# Patient Record
Sex: Male | Born: 1935
Health system: Southern US, Community
[De-identification: ages and names within clinical notes are randomized; demographics above are authoritative.]

## PROBLEM LIST (undated history)

## (undated) DIAGNOSIS — I10 Essential (primary) hypertension: Secondary | ICD-10-CM

## (undated) DIAGNOSIS — J449 Chronic obstructive pulmonary disease, unspecified: Secondary | ICD-10-CM

## (undated) DIAGNOSIS — C169 Malignant neoplasm of stomach, unspecified: Secondary | ICD-10-CM

## (undated) DIAGNOSIS — D649 Anemia, unspecified: Secondary | ICD-10-CM

## (undated) HISTORY — PX: CARDIAC VALVE REPLACEMENT: SHX585

## (undated) HISTORY — PX: JOINT REPLACEMENT: SHX530

---

## 2000-09-10 ENCOUNTER — Ambulatory Visit (HOSPITAL_COMMUNITY): Admission: RE | Admit: 2000-09-10 | Discharge: 2000-09-10 | Payer: Self-pay | Admitting: Gastroenterology

## 2011-01-06 ENCOUNTER — Encounter: Payer: Self-pay | Admitting: Thoracic Surgery (Cardiothoracic Vascular Surgery)

## 2011-01-11 ENCOUNTER — Encounter: Payer: Self-pay | Admitting: Thoracic Surgery (Cardiothoracic Vascular Surgery)

## 2011-01-11 ENCOUNTER — Institutional Professional Consult (permissible substitution) (INDEPENDENT_AMBULATORY_CARE_PROVIDER_SITE_OTHER): Payer: Self-pay | Admitting: Thoracic Surgery (Cardiothoracic Vascular Surgery)

## 2011-01-11 VITALS — BP 110/80 | HR 80 | Temp 99.0°F | Resp 12 | Ht 67.0 in | Wt 182.0 lb

## 2011-01-11 DIAGNOSIS — I350 Nonrheumatic aortic (valve) stenosis: Secondary | ICD-10-CM

## 2011-01-11 DIAGNOSIS — J449 Chronic obstructive pulmonary disease, unspecified: Secondary | ICD-10-CM

## 2011-01-11 DIAGNOSIS — I359 Nonrheumatic aortic valve disorder, unspecified: Secondary | ICD-10-CM

## 2011-01-11 NOTE — Progress Notes (Signed)
PCP is No primary provider on file. Referring Provider is Georgeanna Lea., MD  Chief Complaint  Patient presents with  . Shortness of Breath    HPI  The patient is a 77 year old white male with a past history of severe COPD. The patient smokes 4-5 packs cigarettes per day stopping approximately 20 years prior to this visit. The patient was diagnosed over the past several years as having aortic stenosis. He has been followed by his cardiologist and progression of the aortic stenosis has been documented. A recent echocardiogram done in 12/14/2010 showed an ejection fraction of 50% with moderate aortic insufficiency and severe aortic stenosis. The velocity across the aortic valve was 451 cm/s. The peak gradient was 81 mm mercury and a mean gradient was 53 mm mercury. The aortic valve area of 0.7 cm. Moderate mitral regurgitation was noted. Pulmonary artery pressure was normal. Cardiac catheterization was carried out on 12/26/2010. Aortic valve could not be crossed. There is nonobstructive coronary artery disease with some ectasia noted. The patient was evaluated in our office today for possible aortic valve replacement.  No past medical history on file.  No past surgical history on file.  No family history on file.  Social History History  Substance Use Topics  . Smoking status: Former Smoker -- 5.0 packs/day    Types: Cigarettes    Quit date: 01/11/1992  . Smokeless tobacco: Not on file  . Alcohol Use: No    Current Outpatient Prescriptions  Medication Sig Dispense Refill  . amLODipine-benazepril (LOTREL) 10-40 MG per capsule Take 1 capsule by mouth.        . Aspirin 81 MG EC tablet Take 81 mg by mouth daily.        . carvedilol (COREG) 25 MG tablet Take 25 mg by mouth.        . hydrochlorothiazide 25 MG tablet Take 25 mg by mouth daily.        . Omega-3 Fatty Acids (FISH OIL) 1000 MG CAPS Take 1,000 mg by mouth daily.        Marland Kitchen PRAVASTATIN SODIUM PO Take 10 mg by mouth.          . prednisoLONE acetate (PRED FORTE) 1 % ophthalmic suspension Instill 1 drop into right eye 2 times daily       . prednisoLONE acetate (PRED FORTE) 1 % ophthalmic suspension Instill 1 drop into affected eye(s) 4 times daily       . SLO-NIACIN PO Take by mouth.        . vitamin C (ASCORBIC ACID) 500 MG tablet Take 500 mg by mouth daily.        . vitamin E 400 UNIT capsule Take 400 Units by mouth daily.          Allergies no known allergies   Review of Systems  BP 110/80  Pulse 80  Temp 99 F (37.2 C)  Resp 12  Ht 5\' 7"  (1.702 m)  Wt 182 lb (82.555 kg)  BMI 28.51 kg/m2 Physical Exam   Diagnostic Tests   Impression The patient has severe aortic stenosis. Aortic valve replacement as appropriate. Aortic valve replacement was discussed to the patient and his wife. Risks including death complications including bleeding stroke infection benefits and alternatives reviewed. The porcine valve was decided upon.  Plan Aortic valve replacement will be scheduled.

## 2011-01-31 DIAGNOSIS — I359 Nonrheumatic aortic valve disorder, unspecified: Secondary | ICD-10-CM

## 2011-02-08 ENCOUNTER — Telehealth: Payer: Self-pay | Admitting: *Deleted

## 2011-02-08 NOTE — Telephone Encounter (Signed)
The patients wife called with concerns of lack of mobility and shortness of breath. She states he also has some swelling in his feet and has gained 3 lbs. He is ambulating the same distance as in the hospital, but he feels he should be improving faster than he is. His incisions are healing well and his BP, HR and temp are wnl. His last CXR before discharge 2 days ago was clear. After discussion with Dr Arvilla Market, he was prescribed Lasix 40mg  po daily for 5 days as well as KDur po daily for 5 days. [facility] was contacted for routine post [facility] with a nurse as well as physical therapy. Follow up phone call will be performed on Friday, Oct 5.

## 2011-02-09 ENCOUNTER — Ambulatory Visit (INDEPENDENT_AMBULATORY_CARE_PROVIDER_SITE_OTHER): Payer: Medicare HMO | Admitting: Physician Assistant

## 2011-02-09 DIAGNOSIS — R600 Localized edema: Secondary | ICD-10-CM

## 2011-02-09 DIAGNOSIS — R0602 Shortness of breath: Secondary | ICD-10-CM

## 2011-02-09 DIAGNOSIS — I359 Nonrheumatic aortic valve disorder, unspecified: Secondary | ICD-10-CM

## 2011-02-09 DIAGNOSIS — J449 Chronic obstructive pulmonary disease, unspecified: Secondary | ICD-10-CM

## 2011-02-10 NOTE — Progress Notes (Signed)
Mr. Fouts home health nurse phone today reporting that he continues to have significant shortness of breath with exertion and he has also had a weight gain of 3 or 4 pounds over the past 2 days despite taking Lasix 40 mg by mouth daily. I phone Mr. Kropp home and spoke with his wife who confirmed that indeed had not had any progress with his respiratory status over the past few days. I asked him to come to the office to be evaluated.  Mr. Knobel arrived with his wife. Mr. Cayson is using wheelchair today. He is alert and oriented and does not appear to be in acute distress while at rest. He denies any chest pain other than the expected sternal soreness. He complains of getting short of breath with minimal ambulation around the house.  On exam, his heart rate is in the mid 70s and regular his respiratory rate is 18-20. He is afebrile. He has no JVD. His sternal incision is intact and healing satisfactorily. Sternum stable. His heart is in a regular rate and rhythm with expected soft systolic murmur consistent with recent prosthetic valve. His breath sounds are diminished in the left base and are otherwise clear to auscultation.  Mr. Wacker continues to have 3+ pitting edema in both lower extremities from the knee down into the foot.  Chest x-ray obtained today shows a slightly larger left pleural effusion although still remains fairly small. His lung fields are otherwise clear with normal pulmonary vascularity. Cardiac silhouette is moderately enlarged. Lab data obtained today include a BMP with electrolytes entirely within normal limits: Sodium 136 potassium 3.5 chloride 95 carbon dioxide 34 glucose 142 he went 16 creatinine 0.75 CBC shows white blood count 10,900 hemoglobin 11.4 g hematocrit 34.7%  Impression:  Mr. Glasscock has significant chronic obstructive pulmonary disease based on his preoperative pulmonary function testing which revealed an FEV1 in the 40% range. I think his shortness  of breath at this point is compounded by volume overload and this small but significant left pleural effusion. I believe his symptomswill improved with more aggressive diuresis. I prescribed Demadex 20 mg by mouth daily for the next 5 days along with potassium 20 mEq by mouth twice a day. He was also using a Ventolin inhaler prior to surgery which he has exhausted so I have given a new prescription for a Ventolin MDI. He is asked to decrease his amiodarone to 200mg  po bid.  He is to followup in the office on Monday, October 10. I have asked him to keep careful record of his daily weight. We will also recheck his electrolytes on Monday.

## 2011-02-13 ENCOUNTER — Ambulatory Visit (INDEPENDENT_AMBULATORY_CARE_PROVIDER_SITE_OTHER): Payer: Medicare HMO | Admitting: Physician Assistant

## 2011-02-13 DIAGNOSIS — R609 Edema, unspecified: Secondary | ICD-10-CM

## 2011-02-13 NOTE — Progress Notes (Signed)
[name] return to the office today for followup after being seen for shortness of breath last week here in the office. At that time he was found to have significant lower trimmings edema and also had a small left pleural effusion. He was prescribed Demadex 20 mg by mouth twice a day for 3 days then 20 mg by mouth daily along with potassium 20 mEq by mouth twice daily. He is also given a Ventolin inhaler which is used only twice in the past 4 days.  [name] says he is feeling much better today having less shortness of breath and more endurance with ambulation. He has lost in excess of 5 pounds since his previous visit. He denies any chest pain except for minimal parasternal soreness. On physical exam: Vital signs include heart rate 68 blood pressure 102/50 respirations 20. He is afebrile. The sternal incision is healing with no sign of complication. The mediastinal chest tube suture is removed. His breath sounds are clear to auscultation and are full in all fields. His heart is in a regular rate and rhythm with a very soft systolic murmur consistent with his prosthetic aortic valve. The edema in his lower extremities has decreased dramatically since his previous visit. He continues to have 2+ edema extending up to just above the ankle bilaterally but the remainder of his legs up to the level of the knee is free of edema.  Impression: [name] is making excellent progress with diuresis and resulting improved respiratory status. I asked him to continue the Demadex at 20 mg once daily until he sees his cardiologist on October 17. At that point I expect he will be able to resume his hydrochlorothiazide and stop the Demadex. His other medications will remain unchanged pending the visit with his cardiologist. I asked [name] to stop by the lab after he leaves the office to have a BMP today so that we may check his electrolytes since he has had a brisk diuresis for the past few days. He scheduled to  followup in our office on November 7 and understands he may call us at any time should any other problem arise in the interim.

## 2011-03-15 ENCOUNTER — Ambulatory Visit (INDEPENDENT_AMBULATORY_CARE_PROVIDER_SITE_OTHER): Payer: Medicare HMO | Admitting: Physician Assistant

## 2011-03-15 DIAGNOSIS — Z952 Presence of prosthetic heart valve: Secondary | ICD-10-CM

## 2011-03-15 DIAGNOSIS — I359 Nonrheumatic aortic valve disorder, unspecified: Secondary | ICD-10-CM

## 2011-03-15 DIAGNOSIS — J90 Pleural effusion, not elsewhere classified: Secondary | ICD-10-CM

## 2011-03-15 NOTE — Progress Notes (Signed)
[name] return to the office today for scheduled followup after aortic valve replacement for aortic stenosis and congestive heart failure. The surgery was on 01/31/2011. His postoperative course was uneventful. Since his discharge home he was found to have a left pleural effusion that was drained by a physician in [place]. Following that procedure he had significant pain radiating from his back along the costal margin around anteriorly to the midline. This lasted to 3 days and has now resolved. He denies any shortness of breath or chest pain he still complains of easy fatigability and also complains insomnia.  Physical exam her heart is in a regular rate and rhythm. There is a soft systolic murmur consistent with the pericardial tissue valve. The sternotomy incision is well healed. The sternum stable. Breath sounds are clear to auscultation. Chest x-ray obtained today demonstrates a small left pleural effusion which is stable. Lung fields are otherwise clear and the cardiomediastinal silhouette is moderately enlarged but stable.  Medications are reviewed and include amiodarone 200 mg by mouth daily aspirin 81 mg by mouth daily Coreg 25 mg by mouth twice a day chlorothiazide 25 mg daily lisinopril 40 mg daily Lotrel 10/40 one by mouth daily vitamin E, vitamin C, and a niacin supplement.  [name] has scheduled followup with Dr. Bing Matter  [facility] the [facility] next month. No further followup scheduled with her office but he understands he may contact us at any time if we can be of any assistance.

## 2012-02-15 ENCOUNTER — Encounter (HOSPITAL_COMMUNITY): Payer: Self-pay

## 2012-02-15 ENCOUNTER — Inpatient Hospital Stay (HOSPITAL_COMMUNITY)
Admission: AD | Admit: 2012-02-15 | Discharge: 2012-03-07 | DRG: 327 | Disposition: A | Discharge status: Home / Self Care | Payer: Medicare HMO | Source: Other Acute Inpatient Hospital | Attending: Cardiothoracic Surgery | Admitting: Cardiothoracic Surgery

## 2012-02-15 DIAGNOSIS — M129 Arthropathy, unspecified: Secondary | ICD-10-CM | POA: Diagnosis present

## 2012-02-15 DIAGNOSIS — K2289 Other specified disease of esophagus: Secondary | ICD-10-CM

## 2012-02-15 DIAGNOSIS — N400 Enlarged prostate without lower urinary tract symptoms: Secondary | ICD-10-CM | POA: Diagnosis present

## 2012-02-15 DIAGNOSIS — D371 Neoplasm of uncertain behavior of stomach: Secondary | ICD-10-CM

## 2012-02-15 DIAGNOSIS — Z952 Presence of prosthetic heart valve: Secondary | ICD-10-CM

## 2012-02-15 DIAGNOSIS — D375 Neoplasm of uncertain behavior of rectum: Secondary | ICD-10-CM

## 2012-02-15 DIAGNOSIS — Z87891 Personal history of nicotine dependence: Secondary | ICD-10-CM

## 2012-02-15 DIAGNOSIS — I10 Essential (primary) hypertension: Secondary | ICD-10-CM | POA: Diagnosis present

## 2012-02-15 DIAGNOSIS — Z96659 Presence of unspecified artificial knee joint: Secondary | ICD-10-CM

## 2012-02-15 DIAGNOSIS — K560 Paralytic ileus: Secondary | ICD-10-CM | POA: Diagnosis not present

## 2012-02-15 DIAGNOSIS — D378 Neoplasm of uncertain behavior of other specified digestive organs: Secondary | ICD-10-CM

## 2012-02-15 DIAGNOSIS — C169 Malignant neoplasm of stomach, unspecified: Secondary | ICD-10-CM | POA: Diagnosis present

## 2012-02-15 DIAGNOSIS — Z954 Presence of other heart-valve replacement: Secondary | ICD-10-CM

## 2012-02-15 DIAGNOSIS — K922 Gastrointestinal hemorrhage, unspecified: Secondary | ICD-10-CM | POA: Diagnosis present

## 2012-02-15 DIAGNOSIS — E785 Hyperlipidemia, unspecified: Secondary | ICD-10-CM | POA: Diagnosis present

## 2012-02-15 DIAGNOSIS — C160 Malignant neoplasm of cardia: Principal | ICD-10-CM | POA: Diagnosis present

## 2012-02-15 DIAGNOSIS — K228 Other specified diseases of esophagus: Secondary | ICD-10-CM

## 2012-02-15 DIAGNOSIS — H5440 Blindness, one eye, unspecified eye: Secondary | ICD-10-CM | POA: Diagnosis present

## 2012-02-15 DIAGNOSIS — D62 Acute posthemorrhagic anemia: Secondary | ICD-10-CM | POA: Diagnosis present

## 2012-02-15 HISTORY — DX: Anemia, unspecified: D64.9

## 2012-02-15 HISTORY — DX: Essential (primary) hypertension: I10

## 2012-02-15 HISTORY — DX: Malignant neoplasm of stomach, unspecified: C16.9

## 2012-02-15 HISTORY — DX: Chronic obstructive pulmonary disease, unspecified: J44.9

## 2012-02-15 LAB — CBC
HCT: 28.9 % — ABNORMAL LOW (ref 39.0–52.0)
Hemoglobin: 10.2 g/dL — ABNORMAL LOW (ref 13.0–17.0)
MCH: 30.9 pg (ref 26.0–34.0)
MCHC: 35.3 g/dL (ref 30.0–36.0)
MCV: 87.6 fL (ref 78.0–100.0)
Platelets: DECREASED 10*3/uL (ref 150–400)
RBC: 3.3 MIL/uL — ABNORMAL LOW (ref 4.22–5.81)
RDW: 15.3 % (ref 11.5–15.5)
WBC: 8 10*3/uL (ref 4.0–10.5)

## 2012-02-15 LAB — COMPREHENSIVE METABOLIC PANEL
ALT: 10 U/L (ref 0–53)
AST: 13 U/L (ref 0–37)
Albumin: 2.4 g/dL — ABNORMAL LOW (ref 3.5–5.2)
CO2: 24 mEq/L (ref 19–32)
Calcium: 7.3 mg/dL — ABNORMAL LOW (ref 8.4–10.5)
Chloride: 113 mEq/L — ABNORMAL HIGH (ref 96–112)
GFR calc non Af Amer: >90 mL/min (ref >90)
Sodium: 145 mEq/L (ref 135–145)
Total Bilirubin: 0.6 mg/dL (ref 0.3–1.2)

## 2012-02-15 LAB — MRSA PCR SCREENING: MRSA by PCR: NEGATIVE

## 2012-02-15 MED ORDER — SIMVASTATIN 5 MG PO TABS
5.0000 mg | ORAL_TABLET | Freq: Every day | ORAL | Status: DC
  Administered 2012-02-15 – 2012-02-16 (×2): 5 mg via ORAL
  Filled 2012-02-15 (×3): qty 1

## 2012-02-15 MED ORDER — OMEGA-3-ACID ETHYL ESTERS 1 G PO CAPS
1.0000 g | ORAL_CAPSULE | Freq: Every day | ORAL | Status: DC
  Administered 2012-02-15: 1 g via ORAL
  Filled 2012-02-15 (×2): qty 1

## 2012-02-15 MED ORDER — ASPIRIN 81 MG PO CHEW
81.0000 mg | CHEWABLE_TABLET | Freq: Every day | ORAL | Status: DC
  Administered 2012-02-15: 81 mg via ORAL
  Filled 2012-02-15: qty 1

## 2012-02-15 MED ORDER — CARVEDILOL 25 MG PO TABS
25.0000 mg | ORAL_TABLET | Freq: Two times a day (BID) | ORAL | Status: DC
  Administered 2012-02-15 – 2012-02-17 (×4): 25 mg via ORAL
  Filled 2012-02-15 (×6): qty 1

## 2012-02-15 MED ORDER — AMLODIPINE BESY-BENAZEPRIL HCL 10-40 MG PO CAPS: 1.0000 | ORAL_CAPSULE | Freq: Every day | ORAL | Status: DC

## 2012-02-15 MED ORDER — PREDNISOLONE ACETATE 1 % OP SUSP
2.0000 [drp] | Freq: Every day | OPHTHALMIC | Status: DC
  Administered 2012-02-15 – 2012-03-06 (×21): 2 [drp] via OPHTHALMIC
  Filled 2012-02-15 (×4): qty 1

## 2012-02-15 MED ORDER — HYDROCHLOROTHIAZIDE 25 MG PO TABS
25.0000 mg | ORAL_TABLET | Freq: Every day | ORAL | Status: DC
  Administered 2012-02-15 – 2012-02-17 (×2): 25 mg via ORAL
  Filled 2012-02-15 (×3): qty 1

## 2012-02-15 MED ORDER — VITAMIN C 500 MG PO TABS
500.0000 mg | ORAL_TABLET | Freq: Every day | ORAL | Status: DC
  Administered 2012-02-15 – 2012-02-17 (×2): 500 mg via ORAL
  Filled 2012-02-15 (×3): qty 1

## 2012-02-15 MED ORDER — BENAZEPRIL HCL 40 MG PO TABS
40.0000 mg | ORAL_TABLET | Freq: Every day | ORAL | Status: DC
  Administered 2012-02-15 – 2012-02-17 (×2): 40 mg via ORAL
  Filled 2012-02-15 (×3): qty 1

## 2012-02-15 MED ORDER — AMLODIPINE BESYLATE 10 MG PO TABS
10.0000 mg | ORAL_TABLET | Freq: Every day | ORAL | Status: DC
  Administered 2012-02-15 – 2012-02-17 (×2): 10 mg via ORAL
  Filled 2012-02-15 (×3): qty 1

## 2012-02-15 MED ORDER — ASPIRIN 81 MG PO TBDP: 81.0000 mg | ORAL_TABLET | Freq: Every day | ORAL | Status: DC

## 2012-02-15 MED ORDER — SODIUM CHLORIDE 0.9 % IJ SOLN
INTRAMUSCULAR | Status: AC
  Administered 2012-02-15: 10 mL
  Filled 2012-02-15: qty 10

## 2012-02-15 MED ORDER — ONDANSETRON HCL 4 MG/2ML IJ SOLN: 4.0000 mg | Freq: Four times a day (QID) | INTRAMUSCULAR | Status: DC | PRN

## 2012-02-15 MED ORDER — KCL IN DEXTROSE-NACL 20-5-0.45 MEQ/L-%-% IV SOLN
INTRAVENOUS | Status: DC
  Administered 2012-02-15 – 2012-02-18 (×6): via INTRAVENOUS
  Filled 2012-02-15 (×13): qty 1000

## 2012-02-15 MED ORDER — PANTOPRAZOLE SODIUM 40 MG IV SOLR
40.0000 mg | INTRAVENOUS | Status: DC
  Administered 2012-02-15: 40 mg via INTRAVENOUS
  Filled 2012-02-15 (×2): qty 40

## 2012-02-15 NOTE — H&P (Signed)
[address]            [name] [address]          [phone]          HISTORY AND PHYSICAL     Name: [name] DOB: March 17, 1933 78 y.o. MRN: [id number]    Chief Complaint: Bloody stools   HPI: [name] is a 78 y.o. male who presents in transfer from [facility] for evaluation of an esophageal mass. The patient was in his usual state of health until one week ago. At that time, he developed dark tarry stools. He felt this was related to eating turnip greens earlier in the day, so he disregarded it.  He was having dinner at a friend's house on Monday when he developed diarrhea and vomiting of acute onset. He noted that once again his stool was quite dark, and the vomited material appeared bright red and bloody. He had some crampy abdominal pain at the time. His GI symptoms resolved with rest at home, but he still felt weak. The following evening, he went in to work as usual. While there, he began to feel increasingly weak and fatigued, and his boss noted he appeared pale, so he was sent home. He continued to have dark stools, and presented to his primary care office the following day for evaluation. He was noted to be anemic on labs and was sent to the emergency department at [facility] for possible admission.  On arrival at the hospital, the patient developed hematemesis and abdominal pain. He was immediately admitted to the ICU and transfused 2 units of packed red blood cells for hemoglobin of 6.8. CT scan showed a regular liver contour, with vascular congestion thought to be related to cirrhosis.  A gastroenterology consult was obtained and the patient was seen by Dr. Charm Barges. He underwent an urgent EGD on 02/14/2012 which showed a 4 cm mass at the gastroesophageal junction with a visible bleeding vessel. This was injected with epinephrine and bleeding was controlled. It was GI's opinion that the patient should be transferred to  [facility] for a full thoracic surgery workup for the esophageal mass, and Dr. Tyrone Sage agreed to accept him in transfer.  The patient denies any previous abdominal pain, nausea, vomiting, anorexia, diarrhea, hematochezia, melena, hematemesis, or unexplained weight loss.  He has a history of irritable bowel syndrome, which has been evaluated by Dr. Charm Barges, and does not require any prescription meds.  He has also had colon polyps in the past.      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 Arvilla Market TURP Bilateral knee replacements Left inguinal hernia repair Right ankle fracture repair    Social History:  History   Social History  . Marital Status: Married    Spouse Name: N/A    Number of Children: N/A  . Years of Education: N/A   Social History Main Topics  . Smoking status: Former Smoker -- 0.5 packs/day    Types: Cigarettes    Quit date: 01/11/1992  . Smokeless tobacco: Not on file  . Alcohol Use: No  . Drug Use: No  . Sexually Active: Not on file   Other  Topics Concern  . Not on file   Social History Narrative  . 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 Authorizing Provider  amLODipine-benazepril (LOTREL) 10-40 MG per capsule Take 1 capsule by mouth.      Historical Provider, MD  Aspirin 81 MG EC tablet Take 81 mg by mouth daily.      Historical Provider, MD  carvedilol (COREG) 25 MG  tablet Take 25 mg by mouth.      Historical Provider, MD  hydrochlorothiazide 25 MG tablet Take 25 mg by mouth daily.      Historical Provider, MD  Omega-3 Fatty Acids (FISH OIL) 1000 MG CAPS Take 1,000 mg by mouth daily.      Historical Provider, MD  PRAVASTATIN SODIUM PO Take 10 mg by mouth.      Historical Provider, MD  prednisoLONE acetate (PRED FORTE) 1 % ophthalmic suspension Instill 1 drop into right eye 2 times daily     Historical Provider, MD  prednisoLONE acetate (PRED FORTE) 1 % ophthalmic suspension Instill 1 drop into affected eye(s) 4 times daily     Historical Provider, MD  SLO-NIACIN PO Take by mouth.      Historical Provider, MD  vitamin C (ASCORBIC ACID) 500 MG tablet Take 500 mg by mouth daily.      Historical Provider, MD  vitamin E 400 UNIT capsule Take 400 Units by mouth daily.      Historical Provider, MD     Review of Systems:              Cardiac:   Chest Pain [  n  ]  Resting SOB [ n  ] Exertional SOB  [ n ]  Orthopnea [n  ]   Pedal Edema [  n ]    Palpitations [n  ] Syncope  [n  ]   Presyncope [ y  ]  General: Constitional: recent weight change [n  ]; anorexia [  ]; fatigue [ X ]; nausea [ X ]; night sweats [  ]; fever [  ]; or chills [  ];                                                                                                                                          Dental: poor dentition[  ]  Eye : blurred vision [  ]; diplopia [   ]; vision changes [  ];  Amaurosis fugax[  ]; Blind right eye Resp: cough [  ];  wheezing[  ];  hemoptysis[  ]; shortness of breath[  ]; paroxysmal nocturnal dyspnea[  ];  dyspnea on exertion[  ]; or orthopnea[  ];  GI:  gallstones[  ], vomiting[ X ];  dysphagia[  ]; melena[  X ];  hematochezia [  ]; heartburn[  ];   Hx of  Colonoscopy[ X ]; GU: kidney stones [  ]; hematuria[  ];   dysuria [  ];  nocturia[  ];  history of     obstruction [  ]; urinary incontinence [ x ]             Skin: rash, swelling[  ];, hair loss[  ];   peripheral edema[  ];  or itching[  ]; Musculosketetal: myalgias[  ];  joint swelling[  ];  joint erythema[  ];  joint pain[  ];  back pain[  ];  Heme/Lymph: bruising[  ];  bleeding[  ];  anemia[  ];  Neuro: TIA[  ];  headaches[  ];  stroke[  ];  vertigo[  ];  seizures[  ];   paresthesias[  ];  difficulty walking[  ];  Psych:depression[  ]; anxiety[  ];  Endocrine: diabetes[  ];  thyroid dysfunction[  ];  Immunizations: Flu [  ]; Pneumococcal[  ];     Physical Exam: BP 117/63  Pulse 100  Temp 99.3 F (37.4 C) (Oral)  Ht 5\' 7"  (1.702 m)  Wt 177 lb 11.1 oz (80.6 kg)  BMI 27.83 kg/m2  SpO2 97%  General: No acute distress HEENT: Normocephalic, atraumatic. Oropharynx clear.  External ears and nose without abnormality. Neck: Supple without lymphadenopathy or thyromegaly. Heart: Regular rate and rhythm without murmurs, rubs, or gallops.  Well healed median sternotomy scar. Lungs: Clear to auscultation Abdomen: Soft, nontender, nondistended.  Bowel sounds active in all quadrants. Neurologic: Grossly intact.  No obvious deficits. Extremities: No clubbing, cyanosis or edema.  2+ femoral and pedal pulses bilaterally.   Labs: Lab Results  Component Value Date   WBC 8.0 02/15/2012   HGB 10.2* 02/15/2012   HCT 28.9* 02/15/2012   PLT PLATELET CLUMPS NOTED ON SMEAR, COUNT APPEARS DECREASED 02/15/2012   GLUCOSE 99 02/15/2012   ALT 10 02/15/2012   AST 13 02/15/2012   NA 145 02/15/2012   K 3.7 02/15/2012   CL 113* 02/15/2012   CREATININE 0.62 02/15/2012   BUN 21 02/15/2012   CO2 24 02/15/2012     Assessment/Plan: The patient is a 78 year old male who presented with GI bleeding and was found to have an esophageal mass.  He is admitted to [facility] for further workup.  We will consult gastroenterology, as the mass has not been definitively proven to be a carcinoma.  He will likely need a repeat EGD with biopsies, and esophageal ultrasound.  We will also repeat labs to check his hemoglobin  and hematocrit.   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  Delight Ovens MD Beeper [phone] Office [phone] 02/15/2012 6:11 PM

## 2012-02-15 NOTE — Consult Note (Signed)
Unassigned Consult  Reason for Consult:Distal esophageal mass. Referring Physician: Cardiothoracic Surgery  Erich Montane HPI: This is a 78 year old male without any significant PMH is transferred from [facility] for a bleeding distal esophageal mass.  The patient was noted to be pale and not feeling well at work.  He was told to go home by his employer and subsequently his PCP check his HGB, which was noted to be low.  The patinet was evaluated in the ER and subsequently he developed hematemesis.  An emergent EGD was performed by Dr. Charm Barges and the patient was identified to have arterial spurting from a distal esophageal mass.  Epinephrine was injected at the site and the bleeding was arrested.  Because of the bleeding no biopsies were performed at the time of there emergent procedure.  As a result of the findings he was transferred to [facility] for further evaluation and treatment.  No further bleeding since the acute episode and his HGB has remained stable.  No prior history of dysphagia, GERD, or abdominal pain.  He had a long history of tobacco abuse, but he stopped smoking 20 years ago.  No ETOH use.  Incidentally there was a report of some abnormal liver contour on the CT scan.  No past medical history on file.  No past surgical history on file.  No family history on file.  Social History:  reports that he quit smoking about 20 years ago. His smoking use included Cigarettes. He smoked 5 packs per day. He does not have any smokeless tobacco history on file. He reports that he does not drink alcohol or use illicit drugs.  Allergies: No Known Allergies  Medications:  Scheduled:   . amLODipine  10 mg Oral Daily  . aspirin  81 mg Oral Daily  . benazepril  40 mg Oral Daily  . carvedilol  25 mg Oral BID WC  . hydrochlorothiazide  25 mg Oral Daily  . omega-3 acid ethyl esters  1 g Oral Daily  . pantoprazole (PROTONIX) IV  40 mg Intravenous Q24H  . prednisoLONE acetate   2 drop Right Eye QHS  . simvastatin  5 mg Oral q1800  . sodium chloride      . vitamin C  500 mg Oral Daily  . DISCONTD: amLODipine-benazepril  1 capsule Oral Daily  . DISCONTD: Aspirin  81 mg Oral Daily   Continuous:   . dextrose 5 % and 0.45 % NaCl with KCl 20 mEq/L 100 mL/hr at 02/15/12 1459    Results for orders placed during the hospital encounter of 02/15/12 (from the past 24 hour(s))  CBC     Status: Abnormal   Collection Time   02/15/12  2:22 PM      Component Value Range   WBC 8.0  4.0 - 10.5 K/uL   RBC 3.30 (*) 4.22 - 5.81 MIL/uL   Hemoglobin 10.2 (*) 13.0 - 17.0 g/dL   HCT 16.1 (*) 09.6 - 04.5 %   MCV 87.6  78.0 - 100.0 fL   MCH 30.9  26.0 - 34.0 pg   MCHC 35.3  30.0 - 36.0 g/dL   RDW 40.9  81.1 - 91.4 %   Platelets    150 - 400 K/uL   Value: PLATELET CLUMPS NOTED ON SMEAR, COUNT APPEARS DECREASED  COMPREHENSIVE METABOLIC PANEL     Status: Abnormal   Collection Time   02/15/12  2:22 PM      Component Value Range   Sodium  145  135 - 145 mEq/L   Potassium 3.7  3.5 - 5.1 mEq/L   Chloride 113 (*) 96 - 112 mEq/L   CO2 24  19 - 32 mEq/L   Glucose, Bld 99  70 - 99 mg/dL   BUN 21  6 - 23 mg/dL   Creatinine, Ser 6.04  0.50 - 1.35 mg/dL   Calcium 7.3 (*) 8.4 - 10.5 mg/dL   Total Protein 4.3 (*) 6.0 - 8.3 g/dL   Albumin 2.4 (*) 3.5 - 5.2 g/dL   AST 13  0 - 37 U/L   ALT 10  0 - 53 U/L   Alkaline Phosphatase 27 (*) 39 - 117 U/L   Total Bilirubin 0.6  0.3 - 1.2 mg/dL   GFR calc non Af Amer >90  >90 mL/min   GFR calc Af Amer >90  >90 mL/min     No results found.  ROS:  As stated above in the HPI otherwise negative.  Blood pressure 135/80, pulse 96, temperature 97.8 F (36.6 C), temperature source Oral, height 5\' 7"  (1.702 m), weight 80.6 kg (177 lb 11.1 oz), SpO2 97.00%.    PE: Gen: NAD, Alert and Oriented HEENT:  [place]/AT, EOMI Neck: Supple, no LAD Lungs: CTA Bilaterally CV: RRR without M/G/R ABM: Soft, NTND, +BS Ext: No C/C/E  Assessment/Plan: 1) Distal  esophageal mass.   I reviewed the images of the mass and it appears to be malignant.  I will perform an EGD/EUS tomorrow for further evaluation.  I will obtain biopsies and also stage the lesion.  Plan: 1) EGD/EUS at [facility] tomorrow.  Tytiana Coles D 02/15/2012, 4:08 PM

## 2012-02-16 ENCOUNTER — Encounter (HOSPITAL_COMMUNITY)
Admission: AD | Disposition: A | Discharge status: Home / Self Care | Payer: Self-pay | Source: Other Acute Inpatient Hospital | Attending: Cardiothoracic Surgery

## 2012-02-16 ENCOUNTER — Encounter (HOSPITAL_COMMUNITY): Payer: Self-pay

## 2012-02-16 HISTORY — PX: EUS: SHX5427

## 2012-02-16 LAB — BASIC METABOLIC PANEL
BUN: 11 mg/dL (ref 6–23)
CO2: 27 mEq/L (ref 19–32)
Calcium: 7.7 mg/dL — ABNORMAL LOW (ref 8.4–10.5)
Chloride: 109 mEq/L (ref 96–112)
Creatinine, Ser: 0.61 mg/dL (ref 0.50–1.35)
GFR calc Af Amer: >90 mL/min (ref >90)
GFR calc non Af Amer: >90 mL/min (ref >90)
Glucose, Bld: 111 mg/dL — ABNORMAL HIGH (ref 70–99)
Potassium: 3.4 mEq/L — ABNORMAL LOW (ref 3.5–5.1)
Sodium: 141 mEq/L (ref 135–145)

## 2012-02-16 LAB — CBC
HCT: 27.7 % — ABNORMAL LOW (ref 39.0–52.0)
Hemoglobin: 9.6 g/dL — ABNORMAL LOW (ref 13.0–17.0)
MCH: 30.7 pg (ref 26.0–34.0)
MCHC: 34.7 g/dL (ref 30.0–36.0)
MCV: 88.5 fL (ref 78.0–100.0)
Platelets: 96 10*3/uL — ABNORMAL LOW (ref 150–400)
RBC: 3.13 MIL/uL — ABNORMAL LOW (ref 4.22–5.81)
RDW: 15.2 % (ref 11.5–15.5)
WBC: 6.9 10*3/uL (ref 4.0–10.5)

## 2012-02-16 SURGERY — UPPER ENDOSCOPIC ULTRASOUND (EUS) RADIAL
Anesthesia: Moderate Sedation | Laterality: Left

## 2012-02-16 MED ORDER — LIDOCAINE VISCOUS 2 % MT SOLN
OROMUCOSAL | Status: AC
  Filled 2012-02-16: qty 15

## 2012-02-16 MED ORDER — FENTANYL CITRATE 0.05 MG/ML IJ SOLN
INTRAMUSCULAR | Status: DC | PRN
  Administered 2012-02-16: 25 ug via INTRAVENOUS
  Administered 2012-02-16 (×2): 12.5 ug via INTRAVENOUS

## 2012-02-16 MED ORDER — FENTANYL CITRATE 0.05 MG/ML IJ SOLN
INTRAMUSCULAR | Status: AC
  Filled 2012-02-16: qty 4

## 2012-02-16 MED ORDER — MIDAZOLAM HCL 10 MG/2ML IJ SOLN
INTRAMUSCULAR | Status: DC | PRN
  Administered 2012-02-16 (×3): 1 mg via INTRAVENOUS
  Administered 2012-02-16: 2 mg via INTRAVENOUS

## 2012-02-16 MED ORDER — SODIUM CHLORIDE 0.9 % IV SOLN: INTRAVENOUS | Status: DC

## 2012-02-16 MED ORDER — MIDAZOLAM HCL 10 MG/2ML IJ SOLN
INTRAMUSCULAR | Status: AC
  Filled 2012-02-16: qty 4

## 2012-02-16 MED ORDER — PANTOPRAZOLE SODIUM 40 MG PO TBEC
40.0000 mg | DELAYED_RELEASE_TABLET | Freq: Every day | ORAL | Status: DC
  Administered 2012-02-16: 40 mg via ORAL
  Filled 2012-02-16: qty 1

## 2012-02-16 NOTE — Progress Notes (Addendum)
                    [address]            [name] [address]          [phone]       Procedure(s) (LRB): UPPER ENDOSCOPIC ULTRASOUND (EUS) RADIAL (Left)  Subjective: Stable night, rested well.  No abd pain, nausea, or BM.  Objective: Vital signs in last 24 hours: Patient Vitals for the past 24 hrs:  BP Temp Temp src Pulse Resp SpO2 Height Weight  02/16/12 0721 118/54 mmHg 98.1 F (36.7 C) Oral 58  - - - -  02/16/12 0409 107/41 mmHg 97.5 F (36.4 C) Oral 76  14  98 % - -  02/16/12 0000 108/50 mmHg 97.7 F (36.5 C) Oral 72  - 99 % - -  02/15/12 2021 109/35 mmHg 98.4 F (36.9 C) Oral 84  - 100 % - -  02/15/12 2000 - - - - - - 5\' 7"  (1.702 m) 177 lb 11.1 oz (80.6 kg)  02/15/12 1615 117/63 mmHg 99.3 F (37.4 C) Oral 100  - - - -  02/15/12 1100 135/80 mmHg 97.8 F (36.6 C) Oral 96  - 97 % 5\' 7"  (1.702 m) 177 lb 11.1 oz (80.6 kg)   Current Weight  02/15/12 177 lb 11.1 oz (80.6 kg)     Intake/Output from previous day: 10/10 0701 - 10/11 0700 In: 401.7 [I.V.:401.7] Out: 100 [Urine:100]    PHYSICAL EXAM:  Heart: RRR Lungs: Clear Abdomen: soft, NT/ND, +BS Extremities: warm, well perfused    Lab Results: CBC: Basename 02/16/12 0440 02/15/12 1422  WBC 6.9 8.0  HGB 9.6* 10.2*  HCT 27.7* 28.9*  PLT PENDING PLATELET CLUMPS NOTED ON SMEAR, COUNT APPEARS DECREASED   BMET:  Basename 02/16/12 0440 02/15/12 1422  NA 141 145  K 3.4* 3.7  CL 109 113*  CO2 27 24  GLUCOSE 111* 99  BUN 11 21  CREATININE 0.61 0.62  CALCIUM 7.7* 7.3*    PT/INR: No results found for this basename: LABPROT,INR in the last 72 hours    Assessment/Plan: S/P Procedure(s) (LRB): UPPER ENDOSCOPIC ULTRASOUND (EUS) RADIAL (Left) Esophageal mass- appreciate GI consult.  For EGD/EUS today per Dr. Elnoria Kalup. No further bleeding, H/H stable.  Will monitor.   LOS: 1 day    COLLINS,GINA H 02/16/2012

## 2012-02-16 NOTE — Progress Notes (Addendum)
Pt to transfer via [facility] to [facility] for Endo procedure by Dr Elnoria Gen.  VSS.

## 2012-02-16 NOTE — Op Note (Signed)
[facility] [address] [place], [address]   ENDOSCOPIC ULTRASOUND PROCEDURE REPORT  PATIENT: [name], [name]  MR#: [id number] BIRTHDATE: 12/23/1932  GENDER: Male ENDOSCOPIST: Jeani Hawking, MD REFERRED BY:  Sheliah Plane, M.D. PROCEDURE DATE:  02/16/2012 PROCEDURE:   Upper EUS w/FNA ASA CLASS:      Class III INDICATIONS:   1.  pre-treatment staging of esophageal malignancy. MEDICATIONS: Fentanyl 50 mcg IV and Versed 5 mg IV  DESCRIPTION OF PROCEDURE:   After the risks benefits and alternatives of the procedure were  explained, informed consent was obtained. The patient was then placed in the left, lateral, decubitus postion and IV sedation was administered. Throughout the procedure, the patients blood pressure, pulse and oxygen saturations were monitored continuously.  Under direct visualization, the 110036  endoscope was introduced through the mouth  and advanced to the second portion of the duodenum .  Water was used as necessary to provide an acoustic interface.  Upon completion of the imaging, water was removed and the patient was sent to the recovery room in satisfactory condition.      ESOPHAGUS: In the EG junction there was a large 3 cm polypoid mass in the setting of a small hiatal hernia.  The polypoid lesion was mobile.  Multiple cold biopsies were obtained, which precipitated minor bleeding.  The bleeding did stop with prolonged observation. Staging the lesion was difficult because of the pedunculated nature of the lesion.  Because the lesion was mobile it was difficult for me to discern if it was a T2 or T3 lesion.  I do believe that it is at least a T2 lesion, ie., involvement of the muscularis propria. In the subcarinal, AP window, and celiac axis regions a few small subcentimeter lymph nodes were identified.  A 25 gauge FNA needle was used to obtain an aspirate from one of the celiac nodes, however, the speciment obtained was scant.   A brief survey of the liver did not reveal any overt abnormalities, i.e., metastatic lesions or irregular liver contour.   The scope was then withdrawn from the patient and the procedure completed.  COMPLICATIONS: There were no complications.   [EUS Visualization]  ENDOSCOPIC IMPRESSION: 1) T2 versus T3 distal esophageal mass.  No clear evidence of pathologic lymph adenopathy.  RECOMMENDATIONS: 1) Await biopsy results.   _______________________________ eSignedJeani Hawking, MD 02/16/2012 12:38 PM   XB:JYNWGN Tyrone Sage, MD  PATIENT NAME:  [name], [name] MR#: [id number]

## 2012-02-16 NOTE — Progress Notes (Signed)
Patient ID: [name], male   DOB: [birth year], 78 y.o.   MRN: [id number] TCTS DAILY PROGRESS NOTE                   [address]            [name] [address]          [phone]      Day of Surgery Procedure(s) (LRB): UPPER ENDOSCOPIC ULTRASOUND (EUS) RADIAL (Left)  Total Length of Stay:  LOS: 1 day   Subjective: Feels well after endoscopy    Objective: Vital signs in last 24 hours: Temp:  [97.5 F (36.4 C)-98.4 F (36.9 C)] 98.1 F (36.7 C) (10/11 1552) Pulse Rate:  [58-84] 58  (10/11 1210) Cardiac Rhythm:  [-] Normal sinus rhythm (10/11 1552) Resp:  [6-29] 15  (10/11 1510) BP: (107-147)/(35-87) 140/63 mmHg (10/11 1552) SpO2:  [95 %-100 %] 100 % (10/11 1510) Weight:  [177 lb 11.1 oz (80.6 kg)] 177 lb 11.1 oz (80.6 kg) (10/10 2000)  Filed Weights   02/15/12 1100 02/15/12 2000  Weight: 177 lb 11.1 oz (80.6 kg) 177 lb 11.1 oz (80.6 kg)    Weight change:    Hemodynamic parameters for last 24 hours:    Intake/Output from previous day: 10/10 0701 - 10/11 0700 In: 401.7 [I.V.:401.7] Out: 100 [Urine:100]  Intake/Output this shift: Total I/O In: 100 [I.V.:100] Out: 400 [Urine:400]  Current Meds: Scheduled Meds:   . amLODipine  10 mg Oral Daily  . benazepril  40 mg Oral Daily  . carvedilol  25 mg Oral BID WC  . hydrochlorothiazide  25 mg Oral Daily  . pantoprazole  40 mg Oral Q1200  . pantoprazole (PROTONIX) IV  40 mg Intravenous Q24H  . prednisoLONE acetate  2 drop Right Eye QHS  . simvastatin  5 mg Oral q1800  . vitamin C  500 mg Oral Daily  . DISCONTD: aspirin  81 mg Oral Daily  . DISCONTD: omega-3 acid ethyl esters  1 g Oral Daily   Continuous Infusions:   . dextrose 5 % and 0.45 % NaCl with KCl 20 mEq/L 100 mL/hr at 02/16/12 1357  . DISCONTD: sodium chloride     PRN Meds:.ondansetron (ZOFRAN) IV, DISCONTD: fentaNYL, DISCONTD: midazolam  General appearance: alert and cooperative Neurologic: intact Heart: regular rate and  rhythm, S1, S2 normal, no murmur, click, rub or gallop and normal apical impulse Lungs: clear to auscultation bilaterally Abdomen: soft, non-tender; bowel sounds normal; no masses,  no organomegaly Extremities: extremities normal, atraumatic, no cyanosis or edema and Homans sign is negative, no sign of DVT  Lab Results: CBC: Basename 02/16/12 0440 02/15/12 1422  WBC 6.9 8.0  HGB 9.6* 10.2*  HCT 27.7* 28.9*  PLT 96* PLATELET CLUMPS NOTED ON SMEAR, COUNT APPEARS DECREASED   BMET:  Basename 02/16/12 0440 02/15/12 1422  NA 141 145  K 3.4* 3.7  CL 109 113*  CO2 27 24  GLUCOSE 111* 99  BUN 11 21  CREATININE 0.61 0.62  CALCIUM 7.7* 7.3*    PT/INR: No results found for this basename: LABPROT,INR in the last 72 hours Radiology: No results found.  Endoscopy done with bx and ultrasound T2 vs T3 lesion no clear nodal involvement Mass is partially pedunculated    Assessment/Plan: S/P Procedure(s) (LRB): UPPER ENDOSCOPIC ULTRASOUND (EUS) RADIAL (Left) Follow HCT Clear liquids now  Add proton pump  blocker Advance diet and check hct tomorrow If HCT stable poss home Sunday Office will  arrange out patient PET scan After data gathered will have medical oncology see     [name] B 02/16/2012 4:55 PM

## 2012-02-16 NOTE — Progress Notes (Signed)
Utilization review completed.  

## 2012-02-16 NOTE — Progress Notes (Signed)
Pt arrived back from [facility], via [facility]. VSS. Family at bedside.

## 2012-02-16 NOTE — H&P (View-Only) (Signed)
                    [address]            [place] [address]          [phone]       Procedure(s) (LRB): UPPER ENDOSCOPIC ULTRASOUND (EUS) RADIAL (Left)  Subjective: Stable night, rested well.  No abd pain, nausea, or BM.  Objective: Vital signs in last 24 hours: Patient Vitals for the past 24 hrs:  BP Temp Temp src Pulse Resp SpO2 Height Weight  02/16/12 0721 118/54 mmHg 98.1 F (36.7 C) Oral 58  - - - -  02/16/12 0409 107/41 mmHg 97.5 F (36.4 C) Oral 76  14  98 % - -  02/16/12 0000 108/50 mmHg 97.7 F (36.5 C) Oral 72  - 99 % - -  02/15/12 2021 109/35 mmHg 98.4 F (36.9 C) Oral 84  - 100 % - -  02/15/12 2000 - - - - - - 5' 7" (1.702 m) 177 lb 11.1 oz (80.6 kg)  02/15/12 1615 117/63 mmHg 99.3 F (37.4 C) Oral 100  - - - -  02/15/12 1100 135/80 mmHg 97.8 F (36.6 C) Oral 96  - 97 % 5' 7" (1.702 m) 177 lb 11.1 oz (80.6 kg)   Current Weight  02/15/12 177 lb 11.1 oz (80.6 kg)     Intake/Output from previous day: 10/10 0701 - 10/11 0700 In: 401.7 [I.V.:401.7] Out: 100 [Urine:100]    PHYSICAL EXAM:  Heart: RRR Lungs: Clear Abdomen: soft, NT/ND, +BS Extremities: warm, well perfused    Lab Results: CBC: Basename 02/16/12 0440 02/15/12 1422  WBC 6.9 8.0  HGB 9.6* 10.2*  HCT 27.7* 28.9*  PLT PENDING PLATELET CLUMPS NOTED ON SMEAR, COUNT APPEARS DECREASED   BMET:  Basename 02/16/12 0440 02/15/12 1422  NA 141 145  K 3.4* 3.7  CL 109 113*  CO2 27 24  GLUCOSE 111* 99  BUN 11 21  CREATININE 0.61 0.62  CALCIUM 7.7* 7.3*    PT/INR: No results found for this basename: LABPROT,INR in the last 72 hours    Assessment/Plan: S/P Procedure(s) (LRB): UPPER ENDOSCOPIC ULTRASOUND (EUS) RADIAL (Left) Esophageal mass- appreciate GI consult.  For EGD/EUS today per Dr. Hung. No further bleeding, H/H stable.  Will monitor.   LOS: 1 day    COLLINS,GINA H 02/16/2012    

## 2012-02-16 NOTE — Interval H&P Note (Signed)
History and Physical Interval Note:  02/16/2012 10:53 AM  [name]  has presented today for surgery, with the diagnosis of Distal esophageal mass  The various methods of treatment have been discussed with the patient and family. After consideration of risks, benefits and other options for treatment, the patient has consented to  Procedure(s) (LRB) with comments: UPPER ENDOSCOPIC ULTRASOUND (EUS) RADIAL (Left) - 930 [facility] arranged by [name] -his nurse at [facility] as a surgical intervention .  The patient's history has been reviewed, patient examined, no change in status, stable for surgery.  I have reviewed the patient's chart and labs.  Questions were answered to the patient's satisfaction.     Barron Vanloan D

## 2012-02-17 ENCOUNTER — Encounter (HOSPITAL_COMMUNITY): Payer: Self-pay | Admitting: Radiology

## 2012-02-17 ENCOUNTER — Inpatient Hospital Stay (HOSPITAL_COMMUNITY): Payer: Medicare HMO

## 2012-02-17 DIAGNOSIS — D378 Neoplasm of uncertain behavior of other specified digestive organs: Secondary | ICD-10-CM

## 2012-02-17 DIAGNOSIS — K922 Gastrointestinal hemorrhage, unspecified: Secondary | ICD-10-CM

## 2012-02-17 DIAGNOSIS — D375 Neoplasm of uncertain behavior of rectum: Secondary | ICD-10-CM

## 2012-02-17 DIAGNOSIS — K229 Disease of esophagus, unspecified: Secondary | ICD-10-CM

## 2012-02-17 DIAGNOSIS — D371 Neoplasm of uncertain behavior of stomach: Secondary | ICD-10-CM

## 2012-02-17 LAB — CBC
HCT: 24.9 % — ABNORMAL LOW (ref 39.0–52.0)
HCT: 21.7 % — ABNORMAL LOW (ref 39.0–52.0)
Hemoglobin: 8.7 g/dL — ABNORMAL LOW (ref 13.0–17.0)
Hemoglobin: 7.6 g/dL — ABNORMAL LOW (ref 13.0–17.0)
MCH: 31 pg (ref 26.0–34.0)
MCH: 31.5 pg (ref 26.0–34.0)
MCHC: 34.9 g/dL (ref 30.0–36.0)
MCHC: 35 g/dL (ref 30.0–36.0)
MCV: 88.6 fL (ref 78.0–100.0)
MCV: 90 fL (ref 78.0–100.0)
Platelets: 109 10*3/uL — ABNORMAL LOW (ref 150–400)
Platelets: 124 10*3/uL — ABNORMAL LOW (ref 150–400)
RBC: 2.81 MIL/uL — ABNORMAL LOW (ref 4.22–5.81)
RBC: 2.41 MIL/uL — ABNORMAL LOW (ref 4.22–5.81)
RDW: 14.8 % (ref 11.5–15.5)
RDW: 14.9 % (ref 11.5–15.5)
WBC: 7 10*3/uL (ref 4.0–10.5)
WBC: 9.8 10*3/uL (ref 4.0–10.5)

## 2012-02-17 LAB — BASIC METABOLIC PANEL
BUN: 25 mg/dL — ABNORMAL HIGH (ref 6–23)
BUN: 21 mg/dL (ref 6–23)
CO2: 26 mEq/L (ref 19–32)
CO2: 26 mEq/L (ref 19–32)
Calcium: 7.5 mg/dL — ABNORMAL LOW (ref 8.4–10.5)
Calcium: 7.2 mg/dL — ABNORMAL LOW (ref 8.4–10.5)
Chloride: 109 mEq/L (ref 96–112)
Chloride: 111 mEq/L (ref 96–112)
Creatinine, Ser: 0.53 mg/dL (ref 0.50–1.35)
Creatinine, Ser: 0.56 mg/dL (ref 0.50–1.35)
GFR calc Af Amer: >90 mL/min (ref >90)
GFR calc Af Amer: >90 mL/min (ref >90)
GFR calc non Af Amer: >90 mL/min (ref >90)
GFR calc non Af Amer: >90 mL/min (ref >90)
Glucose, Bld: 109 mg/dL — ABNORMAL HIGH (ref 70–99)
Glucose, Bld: 137 mg/dL — ABNORMAL HIGH (ref 70–99)
Potassium: 4 mEq/L (ref 3.5–5.1)
Potassium: 4.2 mEq/L (ref 3.5–5.1)
Sodium: 140 mEq/L (ref 135–145)
Sodium: 141 mEq/L (ref 135–145)

## 2012-02-17 LAB — GLUCOSE, CAPILLARY: Glucose-Capillary: 118 mg/dL — ABNORMAL HIGH (ref 70–99)

## 2012-02-17 LAB — ABO/RH: ABO/RH(D): A POS

## 2012-02-17 LAB — APTT: aPTT: 28 seconds (ref 24–37)

## 2012-02-17 LAB — PROTIME-INR
INR: 1.33 (ref 0.00–1.49)
Prothrombin Time: 16.2 seconds — ABNORMAL HIGH (ref 11.6–15.2)

## 2012-02-17 LAB — PREPARE RBC (CROSSMATCH)

## 2012-02-17 MED ORDER — MIDAZOLAM HCL 2 MG/2ML IJ SOLN
INTRAMUSCULAR | Status: AC | PRN
  Administered 2012-02-17: 0.5 mg via INTRAVENOUS

## 2012-02-17 MED ORDER — ACETAMINOPHEN 325 MG PO TABS
650.0000 mg | ORAL_TABLET | Freq: Once | ORAL | Status: AC
  Administered 2012-02-17: 650 mg via ORAL
  Filled 2012-02-17: qty 2

## 2012-02-17 MED ORDER — DIPHENHYDRAMINE HCL 25 MG PO CAPS
25.0000 mg | ORAL_CAPSULE | Freq: Once | ORAL | Status: AC
  Administered 2012-02-17: 25 mg via ORAL
  Filled 2012-02-17: qty 1

## 2012-02-17 MED ORDER — FENTANYL CITRATE 0.05 MG/ML IJ SOLN
INTRAMUSCULAR | Status: AC | PRN
  Administered 2012-02-17: 25 ug via INTRAVENOUS

## 2012-02-17 MED ORDER — IOHEXOL 300 MG/ML  SOLN
150.0000 mL | Freq: Once | INTRAMUSCULAR | Status: AC | PRN
  Administered 2012-02-17: 100 mL via INTRA_ARTERIAL

## 2012-02-17 MED ORDER — PANTOPRAZOLE SODIUM 40 MG IV SOLR
40.0000 mg | Freq: Two times a day (BID) | INTRAVENOUS | Status: DC
  Administered 2012-02-17 – 2012-03-05 (×35): 40 mg via INTRAVENOUS
  Filled 2012-02-17 (×44): qty 40

## 2012-02-17 NOTE — Progress Notes (Signed)
@   1145 pt vomited large amount of bright red blood with large clots in it.--Code Blue called. Pt. Placed on his side and was lethargic and pale. Pt had pulse and was breathing. [facility] called. @1150  pt was alert and oriented. Dr. Cornelius Moras bedside@1155 . Received order to transfer to 2300. Pt's vitals stable and transferring @1230 . Danna Hefty

## 2012-02-17 NOTE — Progress Notes (Signed)
Dr Cornelius Moras notified of patient's increased temperature after completion of first unit of prbcs. New orders obtained. Patient without any obvious signs of acute distress. Benadryl and Tylenol administered prior to initiation of second unit of prbcs.

## 2012-02-17 NOTE — Progress Notes (Signed)
Called by bedside RN for assistance, patient vomiting large amounts bright red blood with clots.  En route to patients room, Code Blue activated, As per Rn patient's sats dropped in the 80's, unresponsive and agonal breathing and was very pale.  Upon entering patients room, patient was starting to come to with spontaneous respirations.  VSS,  Dr. Cornelius Moras at bedside, orders received.  Patient transported via bed with monitor on to 2301.  Rn to call if assistance needed

## 2012-02-17 NOTE — H&P (Signed)
Chief Complaint: Upper GI bleed Referring Physician:Owen/Stark HPI: [name] is an 78 y.o. male with newly found esophageal mass after having some dark stools. Underwent EGD with biopsy yesterday which he tolerated well. However, this am, developed acute hematemesis. Concern for ongoing bleeding as hemoglobin has dropped as well. Primary teams including Thoracic surgery and GI have recommended angiogram and possible embolization as an alternative treatment. The pt is sitting comfortably in ICU at present, very coherent and able to give me history. Currently denies CP, SOB.  Past Medical History:  Past Medical History  Diagnosis Date  . Anemia   . Hypertension   . COPD (chronic obstructive pulmonary disease)     Past Surgical History:  Past Surgical History  Procedure Date  . Cardiac valve replacement   . Joint replacement     Family History: History reviewed. No pertinent family history.  Social History:  reports that he quit smoking about 20 years ago. His smoking use included Cigarettes. He smoked 5 packs per day. He does not have any smokeless tobacco history on file. He reports that he does not drink alcohol or use illicit drugs.  Allergies: No Known Allergies  Medications: Medications Prior to Admission  Medication Sig Dispense Refill  . amLODipine-benazepril (LOTREL) 10-40 MG per capsule Take 1 capsule by mouth daily.      . Aspirin 81 MG EC tablet Take 81 mg by mouth daily.        . carvedilol (COREG) 25 MG tablet Take 25 mg by mouth 2 (two) times daily with a meal.      . hydrochlorothiazide 25 MG tablet Take 25 mg by mouth daily.        . niacin (SLO-NIACIN) 500 MG tablet Take 500 mg by mouth at bedtime.      . Omega-3 Fatty Acids (FISH OIL) 1000 MG CAPS Take 1,000 mg by mouth 2 (two) times daily.      . pravastatin (PRAVACHOL) 10 MG tablet Take 10 mg by mouth at bedtime.      . prednisoLONE acetate (PRED FORTE) 1 % ophthalmic suspension Place 2-3 drops into the  right eye at bedtime.      . vitamin C (ASCORBIC ACID) 500 MG tablet Take 500 mg by mouth 2 (two) times daily.        Please HPI for pertinent positives, otherwise complete 10 system ROS negative.  Physical Exam: Blood pressure 112/60, pulse 73, temperature 98.3 F (36.8 C), temperature source Oral, resp. rate 20, height 5\' 7"  (1.702 m), weight 177 lb 11.1 oz (80.6 kg), SpO2 98.00%. Body mass index is 27.83 kg/(m^2).   General Appearance:  Alert, cooperative, no distress, appears stated age  Head:  Normocephalic, without obvious abnormality, atraumatic  ENT: Unremarkable  Neck: Supple, symmetrical, trachea midline, no adenopathy, thyroid: not enlarged, symmetric, no tenderness/mass/nodules  Lungs:   Clear to auscultation bilaterally, no w/r/r, respirations unlabored without use of accessory muscles.  Chest Wall:  No tenderness or deformity, sternotomy scars well healed  Heart:  Regular rate and rhythm, S1, S2 normal, no murmur, rub or gallop. Carotids 2+ without bruit.  Abdomen:   Soft, non-tender, non distended.   Extremities: Extremities normal, atraumatic, no cyanosis or edema  Pulses: 2+ and symmetric  Skin: Skin color, texture, turgor normal, no rashes or lesions  Neurologic: Normal affect, no gross deficits.   Labs: CBC     Status: Abnormal   Collection Time   02/17/12 11:59 AM      Component  Value Range Comment   WBC 9.8  4.0 - 10.5 K/uL    RBC 2.41 (*) 4.22 - 5.81 MIL/uL    Hemoglobin 7.6 (*) 13.0 - 17.0 g/dL    HCT 47.8 (*) 29.5 - 52.0 %    MCV 90.0  78.0 - 100.0 fL    MCH 31.5  26.0 - 34.0 pg    MCHC 35.0  30.0 - 36.0 g/dL    RDW 62.1  30.8 - 65.7 %    Platelets 124 (*) 150 - 400 K/uL   BASIC METABOLIC PANEL     Status: Abnormal   Collection Time   02/17/12 11:59 AM      Component Value Range Comment   Sodium 141  135 - 145 mEq/L    Potassium 4.2  3.5 - 5.1 mEq/L    Chloride 111  96 - 112 mEq/L    CO2 26  19 - 32 mEq/L    Glucose, Bld 137 (*) 70 - 99 mg/dL     BUN 21  6 - 23 mg/dL    Creatinine, Ser 8.46  0.50 - 1.35 mg/dL    Calcium 7.2 (*) 8.4 - 10.5 mg/dL    GFR calc non Af Amer >90  >90 mL/min    GFR calc Af Amer >90  >90 mL/min   APTT     Status: Normal   Collection Time   02/17/12 11:59 AM      Component Value Range Comment   aPTT 28  24 - 37 seconds   PROTIME-INR     Status: Abnormal   Collection Time   02/17/12 11:59 AM      Component Value Range Comment   Prothrombin Time 16.2 (*) 11.6 - 15.2 seconds    INR 1.33  0.00 - 1.49   GLUCOSE, CAPILLARY     Status: Abnormal   Collection Time   02/17/12 12:39 PM      Component Value Range Comment   Glucose-Capillary 118 (*) 70 - 99 mg/dL    No results found.  Assessment/Plan Upper GI bleed secondary to lower esophageal mass S/p biopsy yesterday Labs reviewed, renal fxn good. Discussed procedure of visceral angiogram with intent to locate and possibly embolize vessel associated with bleeding. Pt understands need, understands risks and potential complications associated with procedure Consent signed in chart.  Brayton El PA-C 02/17/2012, 12:56 PM

## 2012-02-17 NOTE — Progress Notes (Signed)
TCTS BRIEF PROGRESS NOTE   Called emergently to bedside for "CODE" when patient developed sudden onset hematemesis Currently awake and alert, breathing comfortably and denies pain NSR 80 with stable BP 127/84, O2 sats 100% Coarse rhonchi both lung fields but symmetrical breath sounds Abdomen soft, non distended and non tender Extremities warm, well perfused   Will keep NPO  Transfer ICU  Check Hgb/Hct, coags  Type and cross 2 units PRBC's  May need semi-elective intubation for airway control if hematemesis recurrs  Consider repeat endoscopy for injection vs possible embolization in interventional radiology  Patient is not a candidate for emergency esophagectomy   Ritamarie Arkin H 02/17/2012 12:09 PM

## 2012-02-17 NOTE — Code Documentation (Signed)
CODE BLUE NOTE  Patient Name: [name]   MRN: [id number]   Date of Birth/ Sex: 1933/02/15 , male      Admission Date: 02/15/2012  Attending Provider: Delight Ovens, MD  Primary Diagnosis: <principal problem not specified>    Indication: Pt was in his usual state of health until this AM, when he was noted to be having large volume hematemesis at approximately 1145. Code blue was subsequently called. At the time of arrival on scene, patient was stable, and no advanced cardiovascular life support measures were necessary at that time. Orders were placed for emergent transfusion of 2U PRBCs.     Technical Description:  - CPR performance duration:  N/A  - Was defibrillation or cardioversion used? No   - Was external pacer placed? No  - Was patient intubated pre/post CPR? No    Medications Administered: Y = Yes; Blank = No Amiodarone    Atropine    Calcium    Epinephrine    Lidocaine    Magnesium    Norepinephrine    Phenylephrine    Sodium bicarbonate    Vasopressin      Post CPR evaluation:  - Final Status - Was patient successfully resuscitated ? Yes - What is current rhythm? NSR - What is current hemodynamic status? stable   Miscellaneous Information:  - Labs sent, including: BMET, CBC  - Primary team notified?  Yes  - Family Notified? Yes     Elenor Legato, MD 02/17/2012, 12:00 PM

## 2012-02-17 NOTE — Progress Notes (Signed)
[facility] Progress Note for Dr. Elnoria Renne  Subjective: One smaller black stool today. No other GI complaints  Objective:  Vital signs in last 24 hours: Temp:  [97.9 F (36.6 C)-98.8 F (37.1 C)] 98.1 F (36.7 C) (10/12 0740) Pulse Rate:  [58-86] 73  (10/12 0740) Resp:  [6-29] 18  (10/12 0740) BP: (100-147)/(52-87) 142/62 mmHg (10/12 0740) SpO2:  [95 %-100 %] 98 % (10/12 0740) Last BM Date: 02/16/12 General:   Alert,  Well-developed, white male in NAD Heart:  Regular rate and rhythm; no murmurs Abdomen:  Soft, nontender and nondistended. Normal bowel sounds, without guarding, and without rebound.   Extremities:  Without edema. Neurologic:  Alert and  oriented x4;  grossly normal neurologically. Psych:  Alert and cooperative. Normal mood and affect.  Intake/Output from previous day: 10/11 0701 - 10/12 0700 In: 200 [I.V.:200] Out: 1400 [Urine:1400] Intake/Output this shift: Total I/O In: 580 [P.O.:480; I.V.:100] Out: 1025 [Urine:1025]  Lab Results:  [facility] 02/17/12 0648 02/16/12 0440 02/15/12 1422  WBC 7.0 6.9 8.0  HGB 8.7* 9.6* 10.2*  HCT 24.9* 27.7* 28.9*  PLT 109* 96* PLATELET CLUMPS NOTED ON SMEAR, COUNT APPEARS DECREASED   BMET  Basename 02/17/12 0648 02/16/12 0440 02/15/12 1422  NA 140 141 145  K 4.0 3.4* 3.7  CL 109 109 113*  CO2 26 27 24   GLUCOSE 109* 111* 99  BUN 25* 11 21  CREATININE 0.53 0.61 0.62  CALCIUM 7.5* 7.7* 7.3*   LFT  Basename 02/15/12 1422  PROT 4.3*  ALBUMIN 2.4*  AST 13  ALT 10  ALKPHOS 27*  BILITOT 0.6  BILIDIR --  IBILI --    Assessment / Plan: 1. Esophageal mass, malignant appearing, with GI bleeding. One black stool today and Hb has drifted down to 8.7. Passing old blood with equilibrating Hb vs mild rebleeding. Monitor stools and Hb/Hct. EGD/EUS yesterday by Dr. Elnoria Brighton. Biopsies pending.  Active Problems:  Acute upper GI bleed  Esophageal mass  S/P aortic valve replacement   LOS: 2 days   Daylin Gruszka T. Russella Dar  MD [facility] 02/17/2012, 10:28 AM

## 2012-02-17 NOTE — Progress Notes (Addendum)
TCTS BRIEF PROGRESS NOTE   Discussed case with Dr Russella Dar (GI) and Dr Bonnielee Haff (IR).  Will proceed to IR for an attempt at embolization.  Transfuse 2 units PRBCs for acute blood loss anemia with Hgb/Hct 7.6/22 down from 8.7/25 this morning.  Discussed with patient and his wife.  Total time at bedside 45 minutes so far today.  [name],[name] 02/17/2012 12:58 PM

## 2012-02-17 NOTE — Progress Notes (Addendum)
1 Day Post-Op Procedure(s) (LRB): UPPER ENDOSCOPIC ULTRASOUND (EUS) RADIAL (Left)  Subjective: [name] has no new complaints this morning.  He states he feels pretty good.  He is tolerating a clear liquid diet and is hoping we can advance his diet some today.  Patient has had a bowel movement since procedure and he states it remains black tar, however there was no bright red blood present.  He also denies hemoptysis   Objective: Vital signs in last 24 hours: Temp:  [97.9 F (36.6 C)-98.8 F (37.1 C)] 98.1 F (36.7 C) (10/12 0740) Pulse Rate:  [58-86] 73  (10/12 0740) Cardiac Rhythm:  [-] Normal sinus rhythm (10/11 2000) Resp:  [6-29] 18  (10/12 0740) BP: (100-147)/(52-87) 142/62 mmHg (10/12 0740) SpO2:  [95 %-100 %] 98 % (10/12 0740)  Intake/Output from previous day: 10/11 0701 - 10/12 0700 In: 200 [I.V.:200] Out: 1400 [Urine:1400] Intake/Output this shift: Total I/O In: 580 [P.O.:480; I.V.:100] Out: 525 [Urine:525]  General appearance: alert, cooperative and no distress Heart: regular rate and rhythm Lungs: clear to auscultation bilaterally Abdomen: soft, non-tender; bowel sounds normal; no masses,  no organomegaly  Lab Results:  [facility] 02/17/12 0648 02/16/12 0440  WBC 7.0 6.9  HGB 8.7* 9.6*  HCT 24.9* 27.7*  PLT 109* 96*   BMET:  Basename 02/17/12 0648 02/16/12 0440  NA 140 141  K 4.0 3.4*  CL 109 109  CO2 26 27  GLUCOSE 109* 111*  BUN 25* 11  CREATININE 0.53 0.61  CALCIUM 7.5* 7.7*    PT/INR: No results found for this basename: LABPROT,INR in the last 72 hours ABG No results found for this basename: phart, pco2, po2, hco3, tco2, acidbasedef, o2sat   CBG (last 3)  No results found for this basename: GLUCAP:3 in the last 72 hours  Assessment/Plan: S/P Procedure(s) (LRB): UPPER ENDOSCOPIC ULTRASOUND (EUS) RADIAL (Left)  1. Esophageal Mass- biopsy performed by GI, pathology results pending 2. H/H- slight drop present- will recheck in AM 3. Diet-  tolerating clear liquid diet- will advance to soft diet today 4. Dispo- patient stable, will repeat H/H in AM if no further drop and patient tolerates diet can possibly d/c in AM and arrange outpatient PET   LOS: 2 days    BARRETT, ERIN 02/17/2012   I have seen and examined the patient and agree with the assessment and plan as outlined.  OWEN,CLARENCE H 02/17/2012

## 2012-02-17 NOTE — Procedures (Signed)
Proc - Visceral angiogram and embolization.   Vessels selected - Celiac, L gastric, gastroepiploic.  Find - No active bleeding and no abnormal vasculature.  Intervention - empiric embolization of the L gastric base.  Comp - none.

## 2012-02-17 NOTE — Progress Notes (Signed)
TCTS BRIEF SICU PROGRESS NOTE   Results of angiogram/embolization noted [name] has had no further hematemesis and he remains clinically stable  Plan: Keep NPO for now and observe closely in SICU  [facility] H 02/17/2012 6:06 PM

## 2012-02-17 NOTE — Progress Notes (Signed)
Pt. has had rebleeding. I discussed the case with Dr. Cornelius Moras and Dr. Bonnielee Haff. Endoscopic therapy has little value with a  bleeding mass. Agree with transfusions. Agree with transfer to 2300. Dr. Bonnielee Haff to evaluate for possible IR intervention.

## 2012-02-18 ENCOUNTER — Inpatient Hospital Stay (HOSPITAL_COMMUNITY): Payer: Medicare HMO

## 2012-02-18 LAB — BASIC METABOLIC PANEL
BUN: 19 mg/dL (ref 6–23)
Chloride: 110 mEq/L (ref 96–112)
Creatinine, Ser: 0.68 mg/dL (ref 0.50–1.35)
GFR calc Af Amer: >90 mL/min (ref >90)
GFR calc non Af Amer: >90 mL/min (ref >90)
Potassium: 3.5 mEq/L (ref 3.5–5.1)

## 2012-02-18 LAB — CBC
HCT: 23.9 % — ABNORMAL LOW (ref 39.0–52.0)
MCHC: 35.1 g/dL (ref 30.0–36.0)
Platelets: 100 10*3/uL — ABNORMAL LOW (ref 150–400)
RDW: 16.2 % — ABNORMAL HIGH (ref 11.5–15.5)
WBC: 9 10*3/uL (ref 4.0–10.5)

## 2012-02-18 NOTE — Progress Notes (Signed)
Patient ID: [name], male   DOB: 12-14-1932, 78 y.o.   MRN: [id number]  [facility] Gastroenterology Progress Note for Dr. Elnoria Eugune  Subjective: Feels pretty good-just hungry. No c/o nausea, abdominal pain or chest pain. No further bleeding  Objective:  Vital signs in last 24 hours: Temp:  [98.1 F (36.7 C)-100.5 F (38.1 C)] 98.2 F (36.8 C) (10/13 0731) Pulse Rate:  [63-97] 85  (10/13 0900) Resp:  [13-29] 22  (10/13 0900) BP: (87-146)/(36-94) 128/63 mmHg (10/13 0900) SpO2:  [90 %-100 %] 98 % (10/13 0900) Last BM Date: 02/17/12 General:   Alert, well-developed, in NAD Heart:  Regular rate and rhythm; no murmurs Pulm;clear ant Abdomen:  Soft, nontender and nondistended. Normal bowel sounds, without guarding,   Extremities:  Without edema. Neurologic:  Alert and  oriented x4;  grossly normal neurologically. Psych:  Alert and cooperative. Normal mood and affect.  Intake/Output from previous day: 10/12 0701 - 10/13 0700 In: 3190 [P.O.:480; I.V.:2010; Blood:700] Out: 2375 [Urine:2375] Intake/Output this shift: Total I/O In: 200 [I.V.:200] Out: -   Lab Results:  Basename 02/18/12 0320 02/17/12 1159 02/17/12 0648  WBC 9.0 9.8 7.0  HGB 8.4* 7.6* 8.7*  HCT 23.9* 21.7* 24.9*  PLT 100* 124* 109*   BMET  Basename 02/18/12 0320 02/17/12 1159 02/17/12 0648  NA 141 141 140  K 3.5 4.2 4.0  CL 110 111 109  CO2 26 26 26   GLUCOSE 115* 137* 109*  BUN 19 21 25*  CREATININE 0.68 0.56 0.53  CALCIUM 7.3* 7.2* 7.5*   LFT  Basename 02/15/12 1422  PROT 4.3*  ALBUMIN 2.4*  AST 13  ALT 10  ALKPHOS 27*  BILITOT 0.6  BILIDIR --  IBILI --   PT/INR  Basename 02/17/12 1159  LABPROT 16.2*  INR 1.33   Assessment / Plan: #1  78 yo male with a polypoid distal esophageal mass with acute bleed - stable s/p selective embolization of celiac, left gastric and epiploic yesterday. Will continue PPI Advance diet to full liquid Further plans for management to be discussed per Dr.  Janina Mayo  #2 anemia-stable- would transfuse for hgb 8 or less  Active Problems:  Acute upper GI bleed  Esophageal mass  S/P aortic valve replacement    LOS: 3 days   Amy Esterwood  02/18/2012, 9:14 AM    I have taken an interval history, reviewed the chart and examined the patient. I agree with the extender's note, impression and recommendations. Await pathology report. Observe for re-bleeding. Drs. Mann/Hung to resume care tomorrow.  Venita Lick. Russella Dar MD Clementeen Graham

## 2012-02-18 NOTE — Progress Notes (Signed)
   CARDIOTHORACIC SURGERY PROGRESS NOTE  2 Days Post-Op  S/P Procedure(s) (LRB): UPPER ENDOSCOPIC ULTRASOUND (EUS) RADIAL (Left)  Subjective: No complaints.  No nausea.  No abdominal pain.  Objective: Vital signs in last 24 hours: Temp:  [98.1 F (36.7 C)-100.5 F (38.1 C)] 98.2 F (36.8 C) (10/13 0731) Pulse Rate:  [63-97] 85  (10/13 0900) Cardiac Rhythm:  [-] Normal sinus rhythm (10/13 0739) Resp:  [13-29] 22  (10/13 0900) BP: (87-146)/(36-94) 128/63 mmHg (10/13 0900) SpO2:  [90 %-100 %] 98 % (10/13 0900)  Physical Exam:  Rhythm:   sinus  Breath sounds: clear  Heart sounds:  RRR  Incisions:  n/a  Abdomen:  Soft, non distended, non tender  Extremities:  Warm, well perfused   Intake/Output from previous day: 10/12 0701 - 10/13 0700 In: 3190 [P.O.:480; I.V.:2010; Blood:700] Out: 2375 [Urine:2375] Intake/Output this shift: Total I/O In: 200 [I.V.:200] Out: -   Lab Results:  Basename 02/18/12 0320 02/17/12 1159  WBC 9.0 9.8  HGB 8.4* 7.6*  HCT 23.9* 21.7*  PLT 100* 124*   BMET:  Basename 02/18/12 0320 02/17/12 1159  NA 141 141  K 3.5 4.2  CL 110 111  CO2 26 26  GLUCOSE 115* 137*  BUN 19 21  CREATININE 0.68 0.56  CALCIUM 7.3* 7.2*    CBG (last 3)   Basename 02/17/12 1239  GLUCAP 118*   PT/INR:   Basename 02/17/12 1159  LABPROT 16.2*  INR 1.33    CXR:  *RADIOLOGY REPORT*  Clinical Data: Follow-up opacity  PORTABLE CHEST - 1 VIEW  Comparison: 02/17/2012  Findings: Normal heart size. Clear lungs. No pneumothorax. No  pleural effusion. No evidence of aspiration. Postop changes from  valve replacement.  IMPRESSION:  No active cardiopulmonary disease.  Original Report Authenticated By: Donavan Burnet, M.D.   Assessment/Plan: S/P Procedure(s) (LRB): UPPER ENDOSCOPIC ULTRASOUND (EUS) RADIAL (Left)  Stable overnight with no signs of recurrent GI bleed Acute blood loss anemia, improved following transfusion Lower esophageal mass, biopsy results  pending   Resume clear liquids diet  Resume normal activity  Continue to monitor in SICU for now  [facility] H 02/18/2012 9:45 AM

## 2012-02-18 NOTE — Progress Notes (Signed)
TCTS BRIEF SICU PROGRESS NOTE  2 Days Post-Op  S/P Procedure(s) (LRB): UPPER ENDOSCOPIC ULTRASOUND (EUS) RADIAL (Left)   Stable day No further hematemesis  Plan: Continue current plan  OWEN,CLARENCE H 02/18/2012 5:57 PM

## 2012-02-18 NOTE — Progress Notes (Signed)
Chaplain upon receiving a code blue page immediately visited patient at 3305. Pt.'s room was full of code blue response Team. Chaplain provided compassionate ministry of presence to pt's wife. Chaplain also shared words of comfort, hope and encouragement with family. Pt was later transferred to 2301. Chaplain escort family to pt in his new room. Chaplain provided spiritual support to both patient and family until their Renato Gails and his wife came in to visit. Patient and family expressed their gratitude to Chaplain's visit and support. Chaplain will continue to provide spiritual to patient as needed at a later time.

## 2012-02-19 ENCOUNTER — Encounter (HOSPITAL_COMMUNITY): Payer: Self-pay | Admitting: Gastroenterology

## 2012-02-19 DIAGNOSIS — D378 Neoplasm of uncertain behavior of other specified digestive organs: Secondary | ICD-10-CM

## 2012-02-19 DIAGNOSIS — D375 Neoplasm of uncertain behavior of rectum: Secondary | ICD-10-CM

## 2012-02-19 DIAGNOSIS — D371 Neoplasm of uncertain behavior of stomach: Secondary | ICD-10-CM

## 2012-02-19 LAB — CBC
Hemoglobin: 8.8 g/dL — ABNORMAL LOW (ref 13.0–17.0)
MCH: 30.4 pg (ref 26.0–34.0)
MCHC: 34.6 g/dL (ref 30.0–36.0)
Platelets: 103 10*3/uL — ABNORMAL LOW (ref 150–400)
RBC: 2.89 MIL/uL — ABNORMAL LOW (ref 4.22–5.81)

## 2012-02-19 LAB — BASIC METABOLIC PANEL
Calcium: 7.4 mg/dL — ABNORMAL LOW (ref 8.4–10.5)
GFR calc non Af Amer: >90 mL/min (ref >90)
Potassium: 3.5 mEq/L (ref 3.5–5.1)
Sodium: 139 mEq/L (ref 135–145)

## 2012-02-19 MED ORDER — POTASSIUM CHLORIDE 20 MEQ/15ML (10%) PO LIQD
40.0000 meq | Freq: Once | ORAL | Status: AC
  Administered 2012-02-19: 40 meq via ORAL
  Filled 2012-02-19: qty 30

## 2012-02-19 MED ORDER — ENSURE PUDDING PO PUDG
1.0000 | Freq: Three times a day (TID) | ORAL | Status: DC
  Administered 2012-02-19 – 2012-02-22 (×6): 1 via ORAL

## 2012-02-19 NOTE — Progress Notes (Signed)
Patient evaluated for community based chronic disease management services with [facility] Care Management Program as a benefit of patient's [employer]. Patient is not eligible for services because there is no noted [facility] affiliated PCP.  Of note, [facility] Care Management services does not replace or interfere with any services that are arranged by inpatient case management or social work.  For additional questions or referrals please contact Anibal Henderson BSN RN [facility] Liaison at [phone].

## 2012-02-19 NOTE — Progress Notes (Addendum)
TCTS DAILY PROGRESS NOTE                   [address]            [place] [address]          [phone]      3 Days Post-Op Procedure(s) (LRB): UPPER ENDOSCOPIC ULTRASOUND (EUS) RADIAL (Left)  Total Length of Stay:  LOS: 4 days   Subjective: Feels well, no significant nausea or abdominal pain  Objective: Vital signs in last 24 hours: Temp:  [98 F (36.7 C)-98.6 F (37 C)] 98.1 F (36.7 C) (10/14 0716) Pulse Rate:  [73-99] 92  (10/14 0700) Cardiac Rhythm:  [-] Normal sinus rhythm (10/14 0700) Resp:  [16-27] 18  (10/14 0700) BP: (89-151)/(40-90) 128/69 mmHg (10/14 0700) SpO2:  [95 %-99 %] 96 % (10/14 0700)  Filed Weights   02/15/12 1100 02/15/12 2000  Weight: 177 lb 11.1 oz (80.6 kg) 177 lb 11.1 oz (80.6 kg)    Weight change:    Hemodynamic parameters for last 24 hours:    Intake/Output from previous day: 10/13 0701 - 10/14 0700 In: 1600 [P.O.:960; I.V.:640] Out: 1526 [Urine:1525; Stool:1]  Intake/Output this shift:    Current Meds: Scheduled Meds:   . pantoprazole (PROTONIX) IV  40 mg Intravenous Q12H  . prednisoLONE acetate  2 drop Right Eye QHS   Continuous Infusions:   . dextrose 5 % and 0.45 % NaCl with KCl 20 mEq/L 20 mL (02/18/12 0951)   PRN Meds:.ondansetron (ZOFRAN) IV  General appearance: alert, cooperative and no distress Heart: regular rate and rhythm Lungs: clear Abdomen: soft, nontender, + BS  Lab Results: CBC: Basename 02/19/12 0515 02/18/12 0320  WBC 7.3 9.0  HGB 8.8* 8.4*  HCT 25.4* 23.9*  PLT 103* 100*   BMET:  Basename 02/19/12 0515 02/18/12 0320  NA 139 141  K 3.5 3.5  CL 106 110  CO2 27 26  GLUCOSE 106* 115*  BUN 9 19  CREATININE 0.58 0.68  CALCIUM 7.4* 7.3*    PT/INR:  Basename 02/17/12 1159  LABPROT 16.2*  INR 1.33   Radiology: Ir Angiogram Visceral Selective  02/17/2012  *RADIOLOGY REPORT*  Clinical Data/Indication: HEMATEMESIS AND MELENA.  ACUTE GASTROINTESTINAL BLEEDING.  DISTAL  ESOPHAGEAL MASS WORRISOME FOR MALIGNANCY.  STATUS POST ENDOSCOPY DEMONSTRATING ACTIVE BLEEDING.  SELECTIVE VISCERAL ARTERIOGRAPHY,IR ULTRASOUND GUIDANCE VASC ACCESS RIGHT,TRANSCATHETER THERAPY EMBOLIZATION,ARTERIOGRAPHY  Sedation: Versed five mg, Fentanyl 25 mg.  Total Moderate Sedation Time: 81 minutes.  Contrast Volume: 100 ml Omnipaque-300.  Additional Medications: None.  Fluoroscopy Time: 22.8 minutes.  Procedure: The procedure, risks, benefits, and alternatives were explained to the patient. Questions regarding the procedure were encouraged and answered. The patient understands and consents to the procedure.  The right groin was prepped with betadine in a sterile fashion, and a sterile drape was applied covering the operative field. A sterile gown and sterile gloves were used for the procedure.  Under sonographic guidance, a 19 gauge needle was inserted into the right common femoral artery and removed over a Benson wire.  A 5- French sheath was inserted.  A Cobra catheter was advanced over the [place] into the aorta.  The celiac axis was selected.  Contrast injection with imaging was performed.  It was advanced over a glide wire into the gastroduodenal artery. Contrast injection was again performed opacifying the gastroepiploic artery.  The Cobra II catheter was exchanged over a Transport planner for a Chung 2.5 catheter.  This was advanced  into the descending thoracic aorta.  The reversed curve was formed.  The celiac axis was then selected and imaging again was performed delineating the takeoff of the left gastric artery.  A micro catheter was then advanced through the [facility] catheter.  The left gastric artery was selected over a double curve 018 glide wire.  Contrast injection was performed. There is no evidence of active bleeding or abnormal vasculature.  An attempt was made to advance the micro catheter towards the GE junction however mid access in left gastric artery branches was obtained.  The catheter was then  retracted to the main left gastric artery trunk and the vessel was embolized utilizing coils.  8 cm x 4 mm as well as a 15 cm x 4 mm Interlock coils were utilized.  Repeat contrast injection was performed.  The catheter was removed and hemostasis was achieved with Exoseal.  Findings: Contrast injected through the celiac axis delineates vascular anatomy and branch vessel positions.  Selective injection of the gastroduodenal artery opacifies the gastroepiploic artery.  There is no evidence of abnormal vasculature, active bleeding, or pseudoaneurysm.  The next series of images demonstrate several injections of the left gastric artery and selective branches.  There is no evidence of active bleeding vascular malformation or pseudoaneurysm.  The last image demonstrates successful embolization of the main left gastric artery via coil embolization.  Allergies:  None.  Vessels selected:  Third order celiac artery x2.  Complications: None.  IMPRESSION: Selective angiography for gastric vasculature demonstrates no evidence of abnormal vasculature, active bleeding, or pseudoaneurysm.  Empiric embolization of the left gastric artery was performed utilizing Interlock coils.   Original Report Authenticated By: Donavan Burnet, M.D.    Ir Transcath/emboliz  02/17/2012  *RADIOLOGY REPORT*  Clinical Data/Indication: HEMATEMESIS AND MELENA.  ACUTE GASTROINTESTINAL BLEEDING.  DISTAL ESOPHAGEAL MASS WORRISOME FOR MALIGNANCY.  STATUS POST ENDOSCOPY DEMONSTRATING ACTIVE BLEEDING.  SELECTIVE VISCERAL ARTERIOGRAPHY,IR ULTRASOUND GUIDANCE VASC ACCESS RIGHT,TRANSCATHETER THERAPY EMBOLIZATION,ARTERIOGRAPHY  Sedation: Versed five mg, Fentanyl 25 mg.  Total Moderate Sedation Time: 81 minutes.  Contrast Volume: 100 ml Omnipaque-300.  Additional Medications: None.  Fluoroscopy Time: 22.8 minutes.  Procedure: The procedure, risks, benefits, and alternatives were explained to the patient. Questions regarding the procedure were encouraged and  answered. The patient understands and consents to the procedure.  The right groin was prepped with betadine in a sterile fashion, and a sterile drape was applied covering the operative field. A sterile gown and sterile gloves were used for the procedure.  Under sonographic guidance, a 19 gauge needle was inserted into the right common femoral artery and removed over a Benson wire.  A 5- French sheath was inserted.  A Cobra catheter was advanced over the [place] into the aorta.  The celiac axis was selected.  Contrast injection with imaging was performed.  It was advanced over a glide wire into the gastroduodenal artery. Contrast injection was again performed opacifying the gastroepiploic artery.  The Cobra II catheter was exchanged over a Transport planner for a Chung 2.5 catheter.  This was advanced into the descending thoracic aorta.  The reversed curve was formed.  The celiac axis was then selected and imaging again was performed delineating the takeoff of the left gastric artery.  A micro catheter was then advanced through the [facility] catheter.  The left gastric artery was selected over a double curve 018 glide wire.  Contrast injection was performed. There is no evidence of active bleeding or abnormal vasculature.  An attempt was made  to advance the micro catheter towards the GE junction however mid access in left gastric artery branches was obtained.  The catheter was then retracted to the main left gastric artery trunk and the vessel was embolized utilizing coils.  8 cm x 4 mm as well as a 15 cm x 4 mm Interlock coils were utilized.  Repeat contrast injection was performed.  The catheter was removed and hemostasis was achieved with Exoseal.  Findings: Contrast injected through the celiac axis delineates vascular anatomy and branch vessel positions.  Selective injection of the gastroduodenal artery opacifies the gastroepiploic artery.  There is no evidence of abnormal vasculature, active bleeding, or pseudoaneurysm.   The next series of images demonstrate several injections of the left gastric artery and selective branches.  There is no evidence of active bleeding vascular malformation or pseudoaneurysm.  The last image demonstrates successful embolization of the main left gastric artery via coil embolization.  Allergies:  None.  Vessels selected:  Third order celiac artery x2.  Complications: None.  IMPRESSION: Selective angiography for gastric vasculature demonstrates no evidence of abnormal vasculature, active bleeding, or pseudoaneurysm.  Empiric embolization of the left gastric artery was performed utilizing Interlock coils.   Original Report Authenticated By: Donavan Burnet, M.D.    Ir Angiogram Follow Up Study  02/17/2012  *RADIOLOGY REPORT*  Clinical Data/Indication: HEMATEMESIS AND MELENA.  ACUTE GASTROINTESTINAL BLEEDING.  DISTAL ESOPHAGEAL MASS WORRISOME FOR MALIGNANCY.  STATUS POST ENDOSCOPY DEMONSTRATING ACTIVE BLEEDING.  SELECTIVE VISCERAL ARTERIOGRAPHY,IR ULTRASOUND GUIDANCE VASC ACCESS RIGHT,TRANSCATHETER THERAPY EMBOLIZATION,ARTERIOGRAPHY  Sedation: Versed five mg, Fentanyl 25 mg.  Total Moderate Sedation Time: 81 minutes.  Contrast Volume: 100 ml Omnipaque-300.  Additional Medications: None.  Fluoroscopy Time: 22.8 minutes.  Procedure: The procedure, risks, benefits, and alternatives were explained to the patient. Questions regarding the procedure were encouraged and answered. The patient understands and consents to the procedure.  The right groin was prepped with betadine in a sterile fashion, and a sterile drape was applied covering the operative field. A sterile gown and sterile gloves were used for the procedure.  Under sonographic guidance, a 19 gauge needle was inserted into the right common femoral artery and removed over a Benson wire.  A 5- French sheath was inserted.  A Cobra catheter was advanced over the [place] into the aorta.  The celiac axis was selected.  Contrast injection with imaging was  performed.  It was advanced over a glide wire into the gastroduodenal artery. Contrast injection was again performed opacifying the gastroepiploic artery.  The Cobra II catheter was exchanged over a Transport planner for a Chung 2.5 catheter.  This was advanced into the descending thoracic aorta.  The reversed curve was formed.  The celiac axis was then selected and imaging again was performed delineating the takeoff of the left gastric artery.  A micro catheter was then advanced through the [facility] catheter.  The left gastric artery was selected over a double curve 018 glide wire.  Contrast injection was performed. There is no evidence of active bleeding or abnormal vasculature.  An attempt was made to advance the micro catheter towards the GE junction however mid access in left gastric artery branches was obtained.  The catheter was then retracted to the main left gastric artery trunk and the vessel was embolized utilizing coils.  8 cm x 4 mm as well as a 15 cm x 4 mm Interlock coils were utilized.  Repeat contrast injection was performed.  The catheter was removed and hemostasis was achieved  with Exoseal.  Findings: Contrast injected through the celiac axis delineates vascular anatomy and branch vessel positions.  Selective injection of the gastroduodenal artery opacifies the gastroepiploic artery.  There is no evidence of abnormal vasculature, active bleeding, or pseudoaneurysm.  The next series of images demonstrate several injections of the left gastric artery and selective branches.  There is no evidence of active bleeding vascular malformation or pseudoaneurysm.  The last image demonstrates successful embolization of the main left gastric artery via coil embolization.  Allergies:  None.  Vessels selected:  Third order celiac artery x2.  Complications: None.  IMPRESSION: Selective angiography for gastric vasculature demonstrates no evidence of abnormal vasculature, active bleeding, or pseudoaneurysm.  Empiric  embolization of the left gastric artery was performed utilizing Interlock coils.   Original Report Authenticated By: Donavan Burnet, M.D.    Ir US Guide Vasc Access Right  02/17/2012  *RADIOLOGY REPORT*  Clinical Data/Indication: HEMATEMESIS AND MELENA.  ACUTE GASTROINTESTINAL BLEEDING.  DISTAL ESOPHAGEAL MASS WORRISOME FOR MALIGNANCY.  STATUS POST ENDOSCOPY DEMONSTRATING ACTIVE BLEEDING.  SELECTIVE VISCERAL ARTERIOGRAPHY,IR ULTRASOUND GUIDANCE VASC ACCESS RIGHT,TRANSCATHETER THERAPY EMBOLIZATION,ARTERIOGRAPHY  Sedation: Versed five mg, Fentanyl 25 mg.  Total Moderate Sedation Time: 81 minutes.  Contrast Volume: 100 ml Omnipaque-300.  Additional Medications: None.  Fluoroscopy Time: 22.8 minutes.  Procedure: The procedure, risks, benefits, and alternatives were explained to the patient. Questions regarding the procedure were encouraged and answered. The patient understands and consents to the procedure.  The right groin was prepped with betadine in a sterile fashion, and a sterile drape was applied covering the operative field. A sterile gown and sterile gloves were used for the procedure.  Under sonographic guidance, a 19 gauge needle was inserted into the right common femoral artery and removed over a Benson wire.  A 5- French sheath was inserted.  A Cobra catheter was advanced over the [place] into the aorta.  The celiac axis was selected.  Contrast injection with imaging was performed.  It was advanced over a glide wire into the gastroduodenal artery. Contrast injection was again performed opacifying the gastroepiploic artery.  The Cobra II catheter was exchanged over a Transport planner for a Chung 2.5 catheter.  This was advanced into the descending thoracic aorta.  The reversed curve was formed.  The celiac axis was then selected and imaging again was performed delineating the takeoff of the left gastric artery.  A micro catheter was then advanced through the [facility] catheter.  The left gastric artery was selected  over a double curve 018 glide wire.  Contrast injection was performed. There is no evidence of active bleeding or abnormal vasculature.  An attempt was made to advance the micro catheter towards the GE junction however mid access in left gastric artery branches was obtained.  The catheter was then retracted to the main left gastric artery trunk and the vessel was embolized utilizing coils.  8 cm x 4 mm as well as a 15 cm x 4 mm Interlock coils were utilized.  Repeat contrast injection was performed.  The catheter was removed and hemostasis was achieved with Exoseal.  Findings: Contrast injected through the celiac axis delineates vascular anatomy and branch vessel positions.  Selective injection of the gastroduodenal artery opacifies the gastroepiploic artery.  There is no evidence of abnormal vasculature, active bleeding, or pseudoaneurysm.  The next series of images demonstrate several injections of the left gastric artery and selective branches.  There is no evidence of active bleeding vascular malformation or pseudoaneurysm.  The last  image demonstrates successful embolization of the main left gastric artery via coil embolization.  Allergies:  None.  Vessels selected:  Third order celiac artery x2.  Complications: None.  IMPRESSION: Selective angiography for gastric vasculature demonstrates no evidence of abnormal vasculature, active bleeding, or pseudoaneurysm.  Empiric embolization of the left gastric artery was performed utilizing Interlock coils.   Original Report Authenticated By: Donavan Burnet, M.D.    Dg Chest Port 1 View  02/18/2012  *RADIOLOGY REPORT*  Clinical Data: Follow-up opacity  PORTABLE CHEST - 1 VIEW  Comparison: 02/17/2012  Findings: Normal heart size.  Clear lungs.  No pneumothorax.  No pleural effusion.  No evidence of aspiration.  Postop changes from valve replacement.  IMPRESSION: No active cardiopulmonary disease.   Original Report Authenticated By: Donavan Burnet, M.D.    Dg Chest  Port 1 View  02/17/2012  *RADIOLOGY REPORT*  Clinical Data: COPD. Aspiration. Previous aortic valve replacement.  PORTABLE CHEST - 1 VIEW  Comparison: 02/15/2012  Findings: Heart size remains at the upper limits of normal.  Both lungs are clear.  No evidence of pleural effusion.  No mass or lymphadenopathy identified.  Prior median sternotomy again noted.  IMPRESSION: No active cardiopulmonary disease.   Original Report Authenticated By: Danae Orleans, M.D.      Assessment/Plan: S/P Procedure(s) (LRB): UPPER ENDOSCOPIC ULTRASOUND (EUS) RADIAL (Left)  1. stable 2 H/H improved 3 path pending 4 replace K+  GOLD,[name] 02/19/2012 8:27 AM   No abdominal pain No further bleeding Will try to get PET scan to ro metastatic disease, if negative consider proceeding with resection. Discussed with pathology, adeno carcinoma, signet ring, poss gastric origin  I have seen and examined [name] and agree with the above assessment  and plan.  Delight Ovens MD Beeper [phone] Office [phone] 02/19/2012 10:35 AM

## 2012-02-19 NOTE — Progress Notes (Signed)
Patient ID: [name], male   DOB: [birth year], 78 y.o.   MRN: [id number]   SICU Evening Rounds:  Hemodynamically stable  Urine output ok  No new problems tonight.

## 2012-02-19 NOTE — Progress Notes (Signed)
Subjective: No acute events.  HGB appears to be stable.  Objective: Vital signs in last 24 hours: Temp:  [98 F (36.7 C)-98.6 F (37 C)] 98.1 F (36.7 C) (10/14 0716) Pulse Rate:  [73-99] 92  (10/14 0700) Resp:  [16-27] 18  (10/14 0700) BP: (89-151)/(40-90) 128/69 mmHg (10/14 0700) SpO2:  [95 %-99 %] 96 % (10/14 0700) Last BM Date: 02/18/12  Intake/Output from previous day: 10/13 0701 - 10/14 0700 In: 1600 [P.O.:960; I.V.:640] Out: 1526 [Urine:1525; Stool:1] Intake/Output this shift:    General appearance: alert and no distress GI: soft, non-tender; bowel sounds normal; no masses,  no organomegaly  Lab Results:  Basename 02/19/12 0515 02/18/12 0320 02/17/12 1159  WBC 7.3 9.0 9.8  HGB 8.8* 8.4* 7.6*  HCT 25.4* 23.9* 21.7*  PLT 103* 100* 124*   BMET  Basename 02/19/12 0515 02/18/12 0320 02/17/12 1159  NA 139 141 141  K 3.5 3.5 4.2  CL 106 110 111  CO2 27 26 26   GLUCOSE 106* 115* 137*  BUN 9 19 21   CREATININE 0.58 0.68 0.56  CALCIUM 7.4* 7.3* 7.2*   LFT No results found for this basename: PROT,ALBUMIN,AST,ALT,ALKPHOS,BILITOT,BILIDIR,IBILI in the last 72 hours PT/INR  Basename 02/17/12 1159  LABPROT 16.2*  INR 1.33   Hepatitis Panel No results found for this basename: HEPBSAG,HCVAB,HEPAIGM,HEPBIGM in the last 72 hours C-Diff No results found for this basename: CDIFFTOX:3 in the last 72 hours Fecal Lactopherrin No results found for this basename: FECLLACTOFRN in the last 72 hours  Studies/Results: Ir Angiogram Visceral Selective  02/17/2012  *RADIOLOGY REPORT*  Clinical Data/Indication: HEMATEMESIS AND MELENA.  ACUTE GASTROINTESTINAL BLEEDING.  DISTAL ESOPHAGEAL MASS WORRISOME FOR MALIGNANCY.  STATUS POST ENDOSCOPY DEMONSTRATING ACTIVE BLEEDING.  SELECTIVE VISCERAL ARTERIOGRAPHY,IR ULTRASOUND GUIDANCE VASC ACCESS RIGHT,TRANSCATHETER THERAPY EMBOLIZATION,ARTERIOGRAPHY  Sedation: Versed five mg, Fentanyl 25 mg.  Total Moderate Sedation Time: 81 minutes.   Contrast Volume: 100 ml Omnipaque-300.  Additional Medications: None.  Fluoroscopy Time: 22.8 minutes.  Procedure: The procedure, risks, benefits, and alternatives were explained to the patient. Questions regarding the procedure were encouraged and answered. The patient understands and consents to the procedure.  The right groin was prepped with betadine in a sterile fashion, and a sterile drape was applied covering the operative field. A sterile gown and sterile gloves were used for the procedure.  Under sonographic guidance, a 19 gauge needle was inserted into the right common femoral artery and removed over a Benson wire.  A 5- French sheath was inserted.  A Cobra catheter was advanced over the [place] into the aorta.  The celiac axis was selected.  Contrast injection with imaging was performed.  It was advanced over a glide wire into the gastroduodenal artery. Contrast injection was again performed opacifying the gastroepiploic artery.  The Cobra II catheter was exchanged over a Transport planner for a Chung 2.5 catheter.  This was advanced into the descending thoracic aorta.  The reversed curve was formed.  The celiac axis was then selected and imaging again was performed delineating the takeoff of the left gastric artery.  A micro catheter was then advanced through the [facility] catheter.  The left gastric artery was selected over a double curve 018 glide wire.  Contrast injection was performed. There is no evidence of active bleeding or abnormal vasculature.  An attempt was made to advance the micro catheter towards the GE junction however mid access in left gastric artery branches was obtained.  The catheter was then retracted to the main left gastric artery  trunk and the vessel was embolized utilizing coils.  8 cm x 4 mm as well as a 15 cm x 4 mm Interlock coils were utilized.  Repeat contrast injection was performed.  The catheter was removed and hemostasis was achieved with Exoseal.  Findings: Contrast injected  through the celiac axis delineates vascular anatomy and branch vessel positions.  Selective injection of the gastroduodenal artery opacifies the gastroepiploic artery.  There is no evidence of abnormal vasculature, active bleeding, or pseudoaneurysm.  The next series of images demonstrate several injections of the left gastric artery and selective branches.  There is no evidence of active bleeding vascular malformation or pseudoaneurysm.  The last image demonstrates successful embolization of the main left gastric artery via coil embolization.  Allergies:  None.  Vessels selected:  Third order celiac artery x2.  Complications: None.  IMPRESSION: Selective angiography for gastric vasculature demonstrates no evidence of abnormal vasculature, active bleeding, or pseudoaneurysm.  Empiric embolization of the left gastric artery was performed utilizing Interlock coils.   Original Report Authenticated By: Donavan Burnet, M.D.    Ir Transcath/emboliz  02/17/2012  *RADIOLOGY REPORT*  Clinical Data/Indication: HEMATEMESIS AND MELENA.  ACUTE GASTROINTESTINAL BLEEDING.  DISTAL ESOPHAGEAL MASS WORRISOME FOR MALIGNANCY.  STATUS POST ENDOSCOPY DEMONSTRATING ACTIVE BLEEDING.  SELECTIVE VISCERAL ARTERIOGRAPHY,IR ULTRASOUND GUIDANCE VASC ACCESS RIGHT,TRANSCATHETER THERAPY EMBOLIZATION,ARTERIOGRAPHY  Sedation: Versed five mg, Fentanyl 25 mg.  Total Moderate Sedation Time: 81 minutes.  Contrast Volume: 100 ml Omnipaque-300.  Additional Medications: None.  Fluoroscopy Time: 22.8 minutes.  Procedure: The procedure, risks, benefits, and alternatives were explained to the patient. Questions regarding the procedure were encouraged and answered. The patient understands and consents to the procedure.  The right groin was prepped with betadine in a sterile fashion, and a sterile drape was applied covering the operative field. A sterile gown and sterile gloves were used for the procedure.  Under sonographic guidance, a 19 gauge needle was  inserted into the right common femoral artery and removed over a Benson wire.  A 5- French sheath was inserted.  A Cobra catheter was advanced over the [place] into the aorta.  The celiac axis was selected.  Contrast injection with imaging was performed.  It was advanced over a glide wire into the gastroduodenal artery. Contrast injection was again performed opacifying the gastroepiploic artery.  The Cobra II catheter was exchanged over a Transport planner for a Chung 2.5 catheter.  This was advanced into the descending thoracic aorta.  The reversed curve was formed.  The celiac axis was then selected and imaging again was performed delineating the takeoff of the left gastric artery.  A micro catheter was then advanced through the [facility] catheter.  The left gastric artery was selected over a double curve 018 glide wire.  Contrast injection was performed. There is no evidence of active bleeding or abnormal vasculature.  An attempt was made to advance the micro catheter towards the GE junction however mid access in left gastric artery branches was obtained.  The catheter was then retracted to the main left gastric artery trunk and the vessel was embolized utilizing coils.  8 cm x 4 mm as well as a 15 cm x 4 mm Interlock coils were utilized.  Repeat contrast injection was performed.  The catheter was removed and hemostasis was achieved with Exoseal.  Findings: Contrast injected through the celiac axis delineates vascular anatomy and branch vessel positions.  Selective injection of the gastroduodenal artery opacifies the gastroepiploic artery.  There is no evidence of  abnormal vasculature, active bleeding, or pseudoaneurysm.  The next series of images demonstrate several injections of the left gastric artery and selective branches.  There is no evidence of active bleeding vascular malformation or pseudoaneurysm.  The last image demonstrates successful embolization of the main left gastric artery via coil embolization.   Allergies:  None.  Vessels selected:  Third order celiac artery x2.  Complications: None.  IMPRESSION: Selective angiography for gastric vasculature demonstrates no evidence of abnormal vasculature, active bleeding, or pseudoaneurysm.  Empiric embolization of the left gastric artery was performed utilizing Interlock coils.   Original Report Authenticated By: Donavan Burnet, M.D.    Ir Angiogram Follow Up Study  02/17/2012  *RADIOLOGY REPORT*  Clinical Data/Indication: HEMATEMESIS AND MELENA.  ACUTE GASTROINTESTINAL BLEEDING.  DISTAL ESOPHAGEAL MASS WORRISOME FOR MALIGNANCY.  STATUS POST ENDOSCOPY DEMONSTRATING ACTIVE BLEEDING.  SELECTIVE VISCERAL ARTERIOGRAPHY,IR ULTRASOUND GUIDANCE VASC ACCESS RIGHT,TRANSCATHETER THERAPY EMBOLIZATION,ARTERIOGRAPHY  Sedation: Versed five mg, Fentanyl 25 mg.  Total Moderate Sedation Time: 81 minutes.  Contrast Volume: 100 ml Omnipaque-300.  Additional Medications: None.  Fluoroscopy Time: 22.8 minutes.  Procedure: The procedure, risks, benefits, and alternatives were explained to the patient. Questions regarding the procedure were encouraged and answered. The patient understands and consents to the procedure.  The right groin was prepped with betadine in a sterile fashion, and a sterile drape was applied covering the operative field. A sterile gown and sterile gloves were used for the procedure.  Under sonographic guidance, a 19 gauge needle was inserted into the right common femoral artery and removed over a Benson wire.  A 5- French sheath was inserted.  A Cobra catheter was advanced over the [place] into the aorta.  The celiac axis was selected.  Contrast injection with imaging was performed.  It was advanced over a glide wire into the gastroduodenal artery. Contrast injection was again performed opacifying the gastroepiploic artery.  The Cobra II catheter was exchanged over a Transport planner for a Chung 2.5 catheter.  This was advanced into the descending thoracic aorta.  The  reversed curve was formed.  The celiac axis was then selected and imaging again was performed delineating the takeoff of the left gastric artery.  A micro catheter was then advanced through the [facility] catheter.  The left gastric artery was selected over a double curve 018 glide wire.  Contrast injection was performed. There is no evidence of active bleeding or abnormal vasculature.  An attempt was made to advance the micro catheter towards the GE junction however mid access in left gastric artery branches was obtained.  The catheter was then retracted to the main left gastric artery trunk and the vessel was embolized utilizing coils.  8 cm x 4 mm as well as a 15 cm x 4 mm Interlock coils were utilized.  Repeat contrast injection was performed.  The catheter was removed and hemostasis was achieved with Exoseal.  Findings: Contrast injected through the celiac axis delineates vascular anatomy and branch vessel positions.  Selective injection of the gastroduodenal artery opacifies the gastroepiploic artery.  There is no evidence of abnormal vasculature, active bleeding, or pseudoaneurysm.  The next series of images demonstrate several injections of the left gastric artery and selective branches.  There is no evidence of active bleeding vascular malformation or pseudoaneurysm.  The last image demonstrates successful embolization of the main left gastric artery via coil embolization.  Allergies:  None.  Vessels selected:  Third order celiac artery x2.  Complications: None.  IMPRESSION: Selective angiography for gastric  vasculature demonstrates no evidence of abnormal vasculature, active bleeding, or pseudoaneurysm.  Empiric embolization of the left gastric artery was performed utilizing Interlock coils.   Original Report Authenticated By: Donavan Burnet, M.D.    Ir US Guide Vasc Access Right  02/17/2012  *RADIOLOGY REPORT*  Clinical Data/Indication: HEMATEMESIS AND MELENA.  ACUTE GASTROINTESTINAL BLEEDING.  DISTAL  ESOPHAGEAL MASS WORRISOME FOR MALIGNANCY.  STATUS POST ENDOSCOPY DEMONSTRATING ACTIVE BLEEDING.  SELECTIVE VISCERAL ARTERIOGRAPHY,IR ULTRASOUND GUIDANCE VASC ACCESS RIGHT,TRANSCATHETER THERAPY EMBOLIZATION,ARTERIOGRAPHY  Sedation: Versed five mg, Fentanyl 25 mg.  Total Moderate Sedation Time: 81 minutes.  Contrast Volume: 100 ml Omnipaque-300.  Additional Medications: None.  Fluoroscopy Time: 22.8 minutes.  Procedure: The procedure, risks, benefits, and alternatives were explained to the patient. Questions regarding the procedure were encouraged and answered. The patient understands and consents to the procedure.  The right groin was prepped with betadine in a sterile fashion, and a sterile drape was applied covering the operative field. A sterile gown and sterile gloves were used for the procedure.  Under sonographic guidance, a 19 gauge needle was inserted into the right common femoral artery and removed over a Benson wire.  A 5- French sheath was inserted.  A Cobra catheter was advanced over the [place] into the aorta.  The celiac axis was selected.  Contrast injection with imaging was performed.  It was advanced over a glide wire into the gastroduodenal artery. Contrast injection was again performed opacifying the gastroepiploic artery.  The Cobra II catheter was exchanged over a Transport planner for a Chung 2.5 catheter.  This was advanced into the descending thoracic aorta.  The reversed curve was formed.  The celiac axis was then selected and imaging again was performed delineating the takeoff of the left gastric artery.  A micro catheter was then advanced through the [facility] catheter.  The left gastric artery was selected over a double curve 018 glide wire.  Contrast injection was performed. There is no evidence of active bleeding or abnormal vasculature.  An attempt was made to advance the micro catheter towards the GE junction however mid access in left gastric artery branches was obtained.  The catheter was then  retracted to the main left gastric artery trunk and the vessel was embolized utilizing coils.  8 cm x 4 mm as well as a 15 cm x 4 mm Interlock coils were utilized.  Repeat contrast injection was performed.  The catheter was removed and hemostasis was achieved with Exoseal.  Findings: Contrast injected through the celiac axis delineates vascular anatomy and branch vessel positions.  Selective injection of the gastroduodenal artery opacifies the gastroepiploic artery.  There is no evidence of abnormal vasculature, active bleeding, or pseudoaneurysm.  The next series of images demonstrate several injections of the left gastric artery and selective branches.  There is no evidence of active bleeding vascular malformation or pseudoaneurysm.  The last image demonstrates successful embolization of the main left gastric artery via coil embolization.  Allergies:  None.  Vessels selected:  Third order celiac artery x2.  Complications: None.  IMPRESSION: Selective angiography for gastric vasculature demonstrates no evidence of abnormal vasculature, active bleeding, or pseudoaneurysm.  Empiric embolization of the left gastric artery was performed utilizing Interlock coils.   Original Report Authenticated By: Donavan Burnet, M.D.    Dg Chest Port 1 View  02/18/2012  *RADIOLOGY REPORT*  Clinical Data: Follow-up opacity  PORTABLE CHEST - 1 VIEW  Comparison: 02/17/2012  Findings: Normal heart size.  Clear lungs.  No pneumothorax.  No pleural effusion.  No evidence of aspiration.  Postop changes from valve replacement.  IMPRESSION: No active cardiopulmonary disease.   Original Report Authenticated By: Donavan Burnet, M.D.    Dg Chest Port 1 View  02/17/2012  *RADIOLOGY REPORT*  Clinical Data: COPD. Aspiration. Previous aortic valve replacement.  PORTABLE CHEST - 1 VIEW  Comparison: 02/15/2012  Findings: Heart size remains at the upper limits of normal.  Both lungs are clear.  No evidence of pleural effusion.  No mass or  lymphadenopathy identified.  Prior median sternotomy again noted.  IMPRESSION: No active cardiopulmonary disease.   Original Report Authenticated By: Danae Orleans, M.D.     Medications:  Scheduled:   . pantoprazole (PROTONIX) IV  40 mg Intravenous Q12H  . prednisoLONE acetate  2 drop Right Eye QHS   Continuous:   . dextrose 5 % and 0.45 % NaCl with KCl 20 mEq/L 20 mL (02/18/12 0951)    Assessment/Plan: 1) Distal esophageal mass. 2) Bleeding from the mass.   I reviewed the weekend's events.  The patient's HGB is stable at this time.  Biopsies pending.  Plan: 1) Follow HGB. 2) Await biopsy results.  LOS: 4 days   Kimbly Eanes D 02/19/2012, 7:57 AM

## 2012-02-20 ENCOUNTER — Ambulatory Visit (HOSPITAL_COMMUNITY)
Admit: 2012-02-20 | Discharge: 2012-02-20 | Disposition: A | Discharge status: Home / Self Care | Payer: Medicare HMO | Attending: Cardiothoracic Surgery | Admitting: Cardiothoracic Surgery

## 2012-02-20 DIAGNOSIS — D378 Neoplasm of uncertain behavior of other specified digestive organs: Secondary | ICD-10-CM

## 2012-02-20 DIAGNOSIS — D371 Neoplasm of uncertain behavior of stomach: Secondary | ICD-10-CM

## 2012-02-20 DIAGNOSIS — D375 Neoplasm of uncertain behavior of rectum: Secondary | ICD-10-CM

## 2012-02-20 LAB — BASIC METABOLIC PANEL
BUN: 10 mg/dL (ref 6–23)
CO2: 25 mEq/L (ref 19–32)
Calcium: 8 mg/dL — ABNORMAL LOW (ref 8.4–10.5)
Chloride: 103 mEq/L (ref 96–112)
Creatinine, Ser: 0.6 mg/dL (ref 0.50–1.35)
GFR calc Af Amer: >90 mL/min (ref >90)
GFR calc non Af Amer: >90 mL/min (ref >90)
Glucose, Bld: 109 mg/dL — ABNORMAL HIGH (ref 70–99)
Potassium: 3.6 mEq/L (ref 3.5–5.1)
Sodium: 136 mEq/L (ref 135–145)

## 2012-02-20 LAB — CBC
HCT: 26.3 % — ABNORMAL LOW (ref 39.0–52.0)
Hemoglobin: 8.8 g/dL — ABNORMAL LOW (ref 13.0–17.0)
MCH: 29.6 pg (ref 26.0–34.0)
MCHC: 33.5 g/dL (ref 30.0–36.0)
MCV: 88.6 fL (ref 78.0–100.0)
Platelets: 143 10*3/uL — ABNORMAL LOW (ref 150–400)
RBC: 2.97 MIL/uL — ABNORMAL LOW (ref 4.22–5.81)
RDW: 15.5 % (ref 11.5–15.5)
WBC: 7.6 10*3/uL (ref 4.0–10.5)

## 2012-02-20 LAB — GLUCOSE, CAPILLARY
Glucose-Capillary: 105 mg/dL — ABNORMAL HIGH (ref 70–99)
Glucose-Capillary: 102 mg/dL — ABNORMAL HIGH (ref 70–99)

## 2012-02-20 MED ORDER — FOLIC ACID 1 MG PO TABS
1.0000 mg | ORAL_TABLET | Freq: Every day | ORAL | Status: DC
  Administered 2012-02-21 – 2012-02-22 (×2): 1 mg via ORAL
  Filled 2012-02-20 (×4): qty 1

## 2012-02-20 MED ORDER — POTASSIUM CHLORIDE CRYS ER 20 MEQ PO TBCR
20.0000 meq | EXTENDED_RELEASE_TABLET | Freq: Once | ORAL | Status: AC
  Administered 2012-02-20: 20 meq via ORAL
  Filled 2012-02-20: qty 1

## 2012-02-20 MED ORDER — FLUDEOXYGLUCOSE F - 18 (FDG) INJECTION
14.6000 | Freq: Once | INTRAVENOUS | Status: AC | PRN
  Administered 2012-02-20: 14.6 via INTRAVENOUS

## 2012-02-20 NOTE — Progress Notes (Signed)
NURSING - PT. TO [place] FOR PET SCAN VIA [facility] WITH ME IN ATTENDANCE LEAVING @ 1130AM & RETURNED @ 1600PM. PT. TOLERATED PROCEDURE WELL.

## 2012-02-20 NOTE — Progress Notes (Signed)
Patient ID: [name], male   DOB: [birth year], 78 y.o.   MRN: [id number] TCTS DAILY PROGRESS NOTE                   [address]            [place] [address]          [phone]      4 Days Post-Op Procedure(s) (LRB): UPPER ENDOSCOPIC ULTRASOUND (EUS) RADIAL (Left)  Total Length of Stay:  LOS: 5 days   Subjective: No more vomiting of blood  Objective: Vital signs in last 24 hours: Temp:  [98.4 F (36.9 C)-99.4 F (37.4 C)] 99.2 F (37.3 C) (10/15 0717) Pulse Rate:  [49-110] 95  (10/15 0700) Cardiac Rhythm:  [-] Normal sinus rhythm (10/15 0400) Resp:  [15-28] 21  (10/15 0700) BP: (91-147)/(44-84) 141/78 mmHg (10/15 0700) SpO2:  [93 %-99 %] 98 % (10/15 0700)  Filed Weights   02/15/12 1100 02/15/12 2000  Weight: 177 lb 11.1 oz (80.6 kg) 177 lb 11.1 oz (80.6 kg)    Weight change:    Hemodynamic parameters for last 24 hours:    Intake/Output from previous day: 10/14 0701 - 10/15 0700 In: 1120 [P.O.:1020; I.V.:80; IV Piggyback:20] Out: 1775 [Urine:1775]  Intake/Output this shift:    Current Meds: Scheduled Meds:   . feeding supplement  1 Container Oral TID BM  . folic acid  1 mg Oral Daily  . pantoprazole (PROTONIX) IV  40 mg Intravenous Q12H  . potassium chloride  40 mEq Oral Once  . prednisoLONE acetate  2 drop Right Eye QHS   Continuous Infusions:   . DISCONTD: dextrose 5 % and 0.45 % NaCl with KCl 20 mEq/L 20 mL (02/18/12 0951)   PRN Meds:.ondansetron (ZOFRAN) IV  General appearance: alert, cooperative and no distress Neurologic: intact Heart: regular rate and rhythm, S1, S2 normal, no murmur, click, rub or gallop Lungs: clear to auscultation bilaterally and normal percussion bilaterally Abdomen: soft, non-tender; bowel sounds normal; no masses,  no organomegaly Extremities: extremities normal, atraumatic, no cyanosis or edema and Homans sign is negative, no sign of DVT  Lab Results: CBC: Basename 02/19/12 0515 02/18/12  0320  WBC 7.3 9.0  HGB 8.8* 8.4*  HCT 25.4* 23.9*  PLT 103* 100*   BMET:  Basename 02/19/12 0515 02/18/12 0320  NA 139 141  K 3.5 3.5  CL 106 110  CO2 27 26  GLUCOSE 106* 115*  BUN 9 19  CREATININE 0.58 0.68  CALCIUM 7.4* 7.3*    PT/INR:  Basename 02/17/12 1159  LABPROT 16.2*  INR 1.33   Radiology: No results found.   Assessment/Plan: S/P Procedure(s) (LRB): UPPER ENDOSCOPIC ULTRASOUND (EUS) RADIAL (Left) Mobilize waiting for PET scan, not functional yesterday Considering resection after results of PET scan    [name] 02/20/2012 7:26 AM

## 2012-02-20 NOTE — Progress Notes (Signed)
Subjective: No acute events.    Objective: Vital signs in last 24 hours: Temp:  [98.4 F (36.9 C)-99.4 F (37.4 C)] 99.2 F (37.3 C) (10/15 0717) Pulse Rate:  [49-110] 95  (10/15 0700) Resp:  [15-28] 21  (10/15 0700) BP: (91-147)/(44-84) 141/78 mmHg (10/15 0700) SpO2:  [93 %-99 %] 98 % (10/15 0700) Last BM Date: 02/19/12  Intake/Output from previous day: 10/14 0701 - 10/15 0700 In: 1120 [P.O.:1020; I.V.:80; IV Piggyback:20] Out: 1775 [Urine:1775] Intake/Output this shift:    General appearance: alert and no distress GI: soft, non-tender; bowel sounds normal; no masses,  no organomegaly  Lab Results:  Basename 02/19/12 0515 02/18/12 0320 02/17/12 1159  WBC 7.3 9.0 9.8  HGB 8.8* 8.4* 7.6*  HCT 25.4* 23.9* 21.7*  PLT 103* 100* 124*   BMET  Basename 02/19/12 0515 02/18/12 0320 02/17/12 1159  NA 139 141 141  K 3.5 3.5 4.2  CL 106 110 111  CO2 27 26 26   GLUCOSE 106* 115* 137*  BUN 9 19 21   CREATININE 0.58 0.68 0.56  CALCIUM 7.4* 7.3* 7.2*   LFT No results found for this basename: PROT,ALBUMIN,AST,ALT,ALKPHOS,BILITOT,BILIDIR,IBILI in the last 72 hours PT/INR  Basename 02/17/12 1159  LABPROT 16.2*  INR 1.33   Hepatitis Panel No results found for this basename: HEPBSAG,HCVAB,HEPAIGM,HEPBIGM in the last 72 hours C-Diff No results found for this basename: CDIFFTOX:3 in the last 72 hours Fecal Lactopherrin No results found for this basename: FECLLACTOFRN in the last 72 hours  Studies/Results: No results found.  Medications:  Scheduled:   . feeding supplement  1 Container Oral TID BM  . folic acid  1 mg Oral Daily  . pantoprazole (PROTONIX) IV  40 mg Intravenous Q12H  . potassium chloride  40 mEq Oral Once  . prednisoLONE acetate  2 drop Right Eye QHS   Continuous:   . 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  Suspicious for a gastric source.   HGB  is stable.  Overall he is well at this time.  Plan: 1) PET scan per Dr. Tyrone Sage. 2) I will sign off at this time.  Call if you have any questions.  LOS: 5 days   Nakesha Ebrahim D 02/20/2012, 7:41 AM

## 2012-02-20 NOTE — Progress Notes (Signed)
Patient ID: [name], male   DOB: 13-Apr-1933, 78 y.o.   MRN: [id number]                   [address]            [place] [address]          [phone]     4 Days Post-Op Procedure(s) (LRB): UPPER ENDOSCOPIC ULTRASOUND (EUS) RADIAL (Left)  Total Length of Stay:  LOS: 5 days  BP 139/71  Pulse 92  Temp 98.6 F (37 C) (Oral)  Resp 26  Ht 5\' 7"  (1.702 m)  Wt 177 lb 11.1 oz (80.6 kg)  BMI 27.83 kg/m2  SpO2 97%  .Intake/Output      10/14 0701 - 10/15 0700 10/15 0701 - 10/16 0700   P.O. 1020    I.V. (mL/kg) 80 (1)    IV Piggyback 20    Total Intake(mL/kg) 1120 (13.9)    Urine (mL/kg/hr) 1775 (0.9) 600 (0.7)   Stool     Total Output 1775 600   Net -655 -600             . DISCONTD: dextrose 5 % and 0.45 % NaCl with KCl 20 mEq/L 20 mL (02/18/12 0951)     Lab Results  Component Value Date   WBC 7.6 02/20/2012   HGB 8.8* 02/20/2012   HCT 26.3* 02/20/2012   PLT 143* 02/20/2012   GLUCOSE 109* 02/20/2012   ALT 10 02/15/2012   AST 13 02/15/2012   NA 136 02/20/2012   K 3.6 02/20/2012   CL 103 02/20/2012   CREATININE 0.60 02/20/2012   BUN 10 02/20/2012   CO2 25 02/20/2012   INR 1.33 02/17/2012   Nm Pet Image Initial (pi) Skull Base To Thigh  02/20/2012  *RADIOLOGY REPORT*  Clinical Data: Initial treatment strategy for esophageal cancer.  NUCLEAR MEDICINE PET SKULL BASE TO THIGH  Fasting Blood Glucose:  102  Technique:  14.6 mCi F-18 FDG was injected intravenously. CT data was obtained and used for attenuation correction and anatomic localization only.  (This was not acquired as a diagnostic CT examination.) Additional exam technical data entered on technologist worksheet.  Comparison:  Multiple exams, including 02/15/2012 and 02/14/2012  Findings:  Neck: Diffuse activity in the enlarged and somewhat heterogeneous right thyroid lobe is similar to background mediastinal activity. There are several foci of increased activity along the right tonsillar  pillar and right tongue base, probably physiologic and incidental but somewhat nonspecific.  Mild asymmetry of the vocal cords noted without hypermetabolic the laryngeal activity.  Right phthisis bulbi noted.  Chest:  Along the tree in bud nodularity in the lingula, there is a 1.0 x 0.8 cm hypermetabolic nodule with maximum standard uptake value of 10.0.  Similarly, at approximately the same vertical level, there is a 0.6 x 0.8 cm nodule associated with adjacent tree in bud nodules anteriorly in the right upper lobe, with a maximum standard uptake value of 6.0 on PET images.  Faint regional activity are noted along the adjacent tree in bud nodularity  The prior report, there is malignancy in the vicinity of the distal esophagus.  There is only faint associated metabolic activity at the gastroesophageal junction.  No para esophageal nodal hypermetabolic activity observed.  Abdomen/Pelvis:  Clips noted in the gastrohepatic ligament.  High activity in the sigmoid colon is probably physiologic; there is sigmoid diverticulosis.  Skeleton:  No focal hypermetabolic activity to suggest skeletal metastasis.  IMPRESSION:  1.  Nodular regions of hypermetabolic activity anteriorly in the right upper lobe and medially in the lingula.  These are in areas associated with tree in bud peripheral opacities. Given the location and appearance as well as the esophageal primary, I favor these as being granulomatous infectious lesions associated with atypical infectious bronchiolitis.  Metastatic disease to the lungs is considered a less likely differential diagnostic consideration. These regions could be assessed for stability or resolution on follow-up CT. 2.  In the vicinity of the distal esophagus, we demonstrate only low-level activity. 3.  Sigmoid diverticulosis. 4.  Enlarged and somewhat heterogeneous right thyroid lobe with activity similar to background.  This may warrant observation ultrasound. 5.  Several small focus of foci of  increased activity along the right tonsillar pillar and right tongue base, probably physiologic but overall nonspecific.   Original Report Authenticated By: Dellia Cloud, M.D.      Stable day, PET scan done as noted Plan resection this week, Friday  Delight Ovens MD  Beeper [phone] Office [phone] 02/20/2012 6:12 PM

## 2012-02-21 LAB — BASIC METABOLIC PANEL
BUN: 8 mg/dL (ref 6–23)
CO2: 27 mEq/L (ref 19–32)
Calcium: 7.7 mg/dL — ABNORMAL LOW (ref 8.4–10.5)
Chloride: 105 mEq/L (ref 96–112)
Creatinine, Ser: 0.62 mg/dL (ref 0.50–1.35)
GFR calc Af Amer: >90 mL/min (ref >90)
GFR calc non Af Amer: >90 mL/min (ref >90)
Glucose, Bld: 104 mg/dL — ABNORMAL HIGH (ref 70–99)
Potassium: 4.2 mEq/L (ref 3.5–5.1)
Sodium: 136 mEq/L (ref 135–145)

## 2012-02-21 LAB — CBC
HCT: 22.9 % — ABNORMAL LOW (ref 39.0–52.0)
Hemoglobin: 7.8 g/dL — ABNORMAL LOW (ref 13.0–17.0)
MCH: 30.2 pg (ref 26.0–34.0)
MCHC: 34.1 g/dL (ref 30.0–36.0)
MCV: 88.8 fL (ref 78.0–100.0)
Platelets: 134 10*3/uL — ABNORMAL LOW (ref 150–400)
RBC: 2.58 MIL/uL — ABNORMAL LOW (ref 4.22–5.81)
RDW: 15.7 % — ABNORMAL HIGH (ref 11.5–15.5)
WBC: 6.1 10*3/uL (ref 4.0–10.5)

## 2012-02-21 LAB — TYPE AND SCREEN
Unit division: 0
Unit division: 0

## 2012-02-21 NOTE — Progress Notes (Signed)
[facility] BRIEF SICU PROGRESS NOTE  5 Days Post-Op  S/P Procedure(s) (LRB): UPPER ENDOSCOPIC ULTRASOUND (EUS) RADIAL (Left)   Stable day  Plan: Continue current plan.  Tentatively for OR Friday  OWEN,CLARENCE H 02/21/2012 6:27 PM

## 2012-02-21 NOTE — Progress Notes (Addendum)
[facility] DAILY PROGRESS NOTE                   [address]            [place] [address]          [phone]      5 Days Post-Op Procedure(s) (LRB): UPPER ENDOSCOPIC ULTRASOUND (EUS) RADIAL (Left)  Total Length of Stay:  LOS: 6 days   Subjective: Feeling ok, without specific C/O  Objective: Vital signs in last 24 hours: Temp:  [97.9 F (36.6 C)-99.3 F (37.4 C)] 97.9 F (36.6 C) (10/16 0700) Pulse Rate:  [76-138] 100  (10/16 0700) Cardiac Rhythm:  [-] Normal sinus rhythm (10/16 0600) Resp:  [17-26] 20  (10/16 0700) BP: (92-166)/(51-81) 134/81 mmHg (10/16 0700) SpO2:  [92 %-99 %] 98 % (10/16 0700)  Filed Weights   02/15/12 1100 02/15/12 2000  Weight: 177 lb 11.1 oz (80.6 kg) 177 lb 11.1 oz (80.6 kg)    Weight change:    Hemodynamic parameters for last 24 hours:    Intake/Output from previous day: 10/15 0701 - 10/16 0700 In: 520 [P.O.:500; IV Piggyback:20] Out: 1825 [Urine:1825]  Intake/Output this shift:    Current Meds: Scheduled Meds:   . feeding supplement  1 Container Oral TID BM  . folic acid  1 mg Oral Daily  . pantoprazole (PROTONIX) IV  40 mg Intravenous Q12H  . potassium chloride  20 mEq Oral Once  . prednisoLONE acetate  2 drop Right Eye QHS   Continuous Infusions:  PRN Meds:.ondansetron (ZOFRAN) IV  General appearance: alert, cooperative and no distress Heart: regular rate and rhythm Lungs: clear to auscultation bilaterally Abdomen: soft, non-tender, + BS  Lab Results: CBC: Basename 02/21/12 0305 02/20/12 0900  WBC 6.1 7.6  HGB 7.8* 8.8*  HCT 22.9* 26.3*  PLT 134* 143*   BMET:  Basename 02/21/12 0305 02/20/12 0900  NA 136 136  K 4.2 3.6  CL 105 103  CO2 27 25  GLUCOSE 104* 109*  BUN 8 10  CREATININE 0.62 0.60  CALCIUM 7.7* 8.0*    PT/INR: No results found for this basename: LABPROT,INR in the last 72 hours Radiology: Nm Pet Image Initial (pi) Skull Base To Thigh  02/20/2012  *RADIOLOGY REPORT*  Clinical  Data: Initial treatment strategy for esophageal cancer.  NUCLEAR MEDICINE PET SKULL BASE TO THIGH  Fasting Blood Glucose:  102  Technique:  14.6 mCi F-18 FDG was injected intravenously. CT data was obtained and used for attenuation correction and anatomic localization only.  (This was not acquired as a diagnostic CT examination.) Additional exam technical data entered on technologist worksheet.  Comparison:  Multiple exams, including 02/15/2012 and 02/14/2012  Findings:  Neck: Diffuse activity in the enlarged and somewhat heterogeneous right thyroid lobe is similar to background mediastinal activity. There are several foci of increased activity along the right tonsillar pillar and right tongue base, probably physiologic and incidental but somewhat nonspecific.  Mild asymmetry of the vocal cords noted without hypermetabolic the laryngeal activity.  Right phthisis bulbi noted.  Chest:  Along the tree in bud nodularity in the lingula, there is a 1.0 x 0.8 cm hypermetabolic nodule with maximum standard uptake value of 10.0.  Similarly, at approximately the same vertical level, there is a 0.6 x 0.8 cm nodule associated with adjacent tree in bud nodules anteriorly in the right upper lobe, with a maximum standard uptake value of 6.0 on PET images.  Faint regional activity are  noted along the adjacent tree in bud nodularity  The prior report, there is malignancy in the vicinity of the distal esophagus.  There is only faint associated metabolic activity at the gastroesophageal junction.  No para esophageal nodal hypermetabolic activity observed.  Abdomen/Pelvis:  Clips noted in the gastrohepatic ligament.  High activity in the sigmoid colon is probably physiologic; there is sigmoid diverticulosis.  Skeleton:  No focal hypermetabolic activity to suggest skeletal metastasis.  IMPRESSION:  1.  Nodular regions of hypermetabolic activity anteriorly in the right upper lobe and medially in the lingula.  These are in areas  associated with tree in bud peripheral opacities. Given the location and appearance as well as the esophageal primary, I favor these as being granulomatous infectious lesions associated with atypical infectious bronchiolitis.  Metastatic disease to the lungs is considered a less likely differential diagnostic consideration. These regions could be assessed for stability or resolution on follow-up CT. 2.  In the vicinity of the distal esophagus, we demonstrate only low-level activity. 3.  Sigmoid diverticulosis. 4.  Enlarged and somewhat heterogeneous right thyroid lobe with activity similar to background.  This may warrant observation ultrasound. 5.  Several small focus of foci of increased activity along the right tonsillar pillar and right tongue base, probably physiologic but overall nonspecific.   Original Report Authenticated By: Dellia Cloud, M.D.      Assessment/Plan: S/P Procedure(s) (LRB): UPPER ENDOSCOPIC ULTRASOUND (EUS) RADIAL (Left)  1. Remains clinically very stable with surgery poss on FRI for resection. 2 Some drop in HCT, could consider preop transfusion   GOLD,[name] 02/21/2012 8:13 AM  Discussed with general surgery Plan surgery Friday I have seen and examined Erich Montane and agree with the above assessment  and plan.  Delight Ovens MD Beeper [phone] Office [phone] 02/21/2012 9:07 AM

## 2012-02-22 ENCOUNTER — Encounter (HOSPITAL_COMMUNITY): Payer: Self-pay | Admitting: Anesthesiology

## 2012-02-22 LAB — URINALYSIS, ROUTINE W REFLEX MICROSCOPIC
Glucose, UA: NEGATIVE mg/dL
Leukocytes, UA: NEGATIVE
Nitrite: NEGATIVE
Specific Gravity, Urine: 1.007 (ref 1.005–1.030)
pH: 7 (ref 5.0–8.0)

## 2012-02-22 LAB — COMPREHENSIVE METABOLIC PANEL
ALT: 10 U/L (ref 0–53)
AST: 13 U/L (ref 0–37)
Albumin: 2.4 g/dL — ABNORMAL LOW (ref 3.5–5.2)
Alkaline Phosphatase: 43 U/L (ref 39–117)
GFR calc Af Amer: >90 mL/min (ref >90)
Glucose, Bld: 114 mg/dL — ABNORMAL HIGH (ref 70–99)
Potassium: 4.1 mEq/L (ref 3.5–5.1)
Sodium: 138 mEq/L (ref 135–145)
Total Protein: 5.4 g/dL — ABNORMAL LOW (ref 6.0–8.3)

## 2012-02-22 LAB — CBC
Hemoglobin: 8.6 g/dL — ABNORMAL LOW (ref 13.0–17.0)
MCHC: 32.8 g/dL (ref 30.0–36.0)
Platelets: 189 10*3/uL (ref 150–400)
RBC: 2.92 MIL/uL — ABNORMAL LOW (ref 4.22–5.81)

## 2012-02-22 LAB — POCT I-STAT 3, ART BLOOD GAS (G3+)
Acid-Base Excess: 4 mmol/L — ABNORMAL HIGH (ref 0.0–2.0)
Bicarbonate: 27.8 mEq/L — ABNORMAL HIGH (ref 20.0–24.0)
O2 Saturation: 95 %
pCO2 arterial: 38.2 mmHg (ref 35.0–45.0)
pO2, Arterial: 70 mmHg — ABNORMAL LOW (ref 80.0–100.0)

## 2012-02-22 MED ORDER — DEXTROSE 5 % IV SOLN
2.0000 g | Freq: Once | INTRAVENOUS | Status: AC
  Administered 2012-02-23: 2 g via INTRAVENOUS
  Filled 2012-02-22: qty 2

## 2012-02-22 MED ORDER — MIDAZOLAM HCL 2 MG/2ML IJ SOLN: 0.5000 mg | INTRAMUSCULAR | Status: DC | PRN

## 2012-02-22 MED ORDER — FENTANYL CITRATE 0.05 MG/ML IJ SOLN
50.0000 ug | INTRAMUSCULAR | Status: DC | PRN
  Administered 2012-02-23: 50 ug via INTRAVENOUS
  Filled 2012-02-22: qty 2

## 2012-02-22 NOTE — Progress Notes (Addendum)
[facility] DAILY PROGRESS NOTE                   [address]            [place] [address]          [phone]      6 Days Post-Op Procedure(s) (LRB): UPPER ENDOSCOPIC ULTRASOUND (EUS) RADIAL (Left)  Total Length of Stay:  LOS: 7 days   Subjective: Feels fine   Objective: Vital signs in last 24 hours: Temp:  [97.4 F (36.3 C)-99.1 F (37.3 C)] 97.4 F (36.3 C) (10/17 0736) Pulse Rate:  [75-115] 90  (10/17 0800) Cardiac Rhythm:  [-] Normal sinus rhythm (10/17 0800) Resp:  [16-24] 20  (10/17 0800) BP: (108-162)/(57-86) 149/76 mmHg (10/17 0800) SpO2:  [92 %-100 %] 98 % (10/17 0800)  Filed Weights   02/15/12 1100 02/15/12 2000  Weight: 177 lb 11.1 oz (80.6 kg) 177 lb 11.1 oz (80.6 kg)    Weight change:    Hemodynamic parameters for last 24 hours:    Intake/Output from previous day: 10/16 0701 - 10/17 0700 In: 1130 [P.O.:1110; IV Piggyback:20] Out: 2450 [Urine:2450]  Intake/Output this shift:    Current Meds: Scheduled Meds:   . feeding supplement  1 Container Oral TID BM  . folic acid  1 mg Oral Daily  . pantoprazole (PROTONIX) IV  40 mg Intravenous Q12H  . prednisoLONE acetate  2 drop Right Eye QHS   Continuous Infusions:  PRN Meds:.ondansetron (ZOFRAN) IV  General appearance: alert, cooperative and no distress Heart: regular rate and rhythm Lungs: clear to auscultation bilaterally Abdomen: soft, nontender  Lab Results: CBC: Basename 02/21/12 0305 02/20/12 0900  WBC 6.1 7.6  HGB 7.8* 8.8*  HCT 22.9* 26.3*  PLT 134* 143*   BMET:  Basename 02/21/12 0305 02/20/12 0900  NA 136 136  K 4.2 3.6  CL 105 103  CO2 27 25  GLUCOSE 104* 109*  BUN 8 10  CREATININE 0.62 0.60  CALCIUM 7.7* 8.0*    PT/INR: No results found for this basename: LABPROT,INR in the last 72 hours Radiology: Nm Pet Image Initial (pi) Skull Base To Thigh  02/20/2012  *RADIOLOGY REPORT*  Clinical Data: Initial treatment strategy for esophageal cancer.  NUCLEAR  MEDICINE PET SKULL BASE TO THIGH  Fasting Blood Glucose:  102  Technique:  14.6 mCi F-18 FDG was injected intravenously. CT data was obtained and used for attenuation correction and anatomic localization only.  (This was not acquired as a diagnostic CT examination.) Additional exam technical data entered on technologist worksheet.  Comparison:  Multiple exams, including 02/15/2012 and 02/14/2012  Findings:  Neck: Diffuse activity in the enlarged and somewhat heterogeneous right thyroid lobe is similar to background mediastinal activity. There are several foci of increased activity along the right tonsillar pillar and right tongue base, probably physiologic and incidental but somewhat nonspecific.  Mild asymmetry of the vocal cords noted without hypermetabolic the laryngeal activity.  Right phthisis bulbi noted.  Chest:  Along the tree in bud nodularity in the lingula, there is a 1.0 x 0.8 cm hypermetabolic nodule with maximum standard uptake value of 10.0.  Similarly, at approximately the same vertical level, there is a 0.6 x 0.8 cm nodule associated with adjacent tree in bud nodules anteriorly in the right upper lobe, with a maximum standard uptake value of 6.0 on PET images.  Faint regional activity are noted along the adjacent tree in bud nodularity  The prior report, there  is malignancy in the vicinity of the distal esophagus.  There is only faint associated metabolic activity at the gastroesophageal junction.  No para esophageal nodal hypermetabolic activity observed.  Abdomen/Pelvis:  Clips noted in the gastrohepatic ligament.  High activity in the sigmoid colon is probably physiologic; there is sigmoid diverticulosis.  Skeleton:  No focal hypermetabolic activity to suggest skeletal metastasis.  IMPRESSION:  1.  Nodular regions of hypermetabolic activity anteriorly in the right upper lobe and medially in the lingula.  These are in areas associated with tree in bud peripheral opacities. Given the location and  appearance as well as the esophageal primary, I favor these as being granulomatous infectious lesions associated with atypical infectious bronchiolitis.  Metastatic disease to the lungs is considered a less likely differential diagnostic consideration. These regions could be assessed for stability or resolution on follow-up CT. 2.  In the vicinity of the distal esophagus, we demonstrate only low-level activity. 3.  Sigmoid diverticulosis. 4.  Enlarged and somewhat heterogeneous right thyroid lobe with activity similar to background.  This may warrant observation ultrasound. 5.  Several small focus of foci of increased activity along the right tonsillar pillar and right tongue base, probably physiologic but overall nonspecific.   Original Report Authenticated By: Dellia Cloud, M.D.      Assessment/Plan: S/P Procedure(s) (LRB): UPPER ENDOSCOPIC ULTRASOUND (EUS) RADIAL (Left)  1. stable for OR in am     GOLD,[name] 02/22/2012 8:48 AM    The goals risks and alternatives of the planned surgical procedure esophagogastrectomy  have been discussed with the patient in detail. The risks of the procedure including death, infection, stroke, myocardial infarction, bleeding, blood transfusion have all been discussed specifically. We specifically discussed complications, which include, but were not limited to: recurrent nerve injury with possible permanent hoarseness, anastomotic leak, airway and great vessel injury, conduit ischemia, thoracic duct leak, the inability to complete the operation via a transhiatal approach requiring a right thoracotomy,  bleeding, need for blood transfusion and the potential need for ventilator suppor I have quoted [name] a 5 % of perioperative mortality and a complication rate as high as 30 %. The patient's questions have been answered.[name] is willing  to proceed with the planned procedure.   I have seen and examined [name] and agree  with the above assessment  and plan.  Delight Ovens MD Beeper [phone] Office [phone] 02/22/2012 10:38 PM

## 2012-02-22 NOTE — Progress Notes (Signed)
Chaplain met family member outside patient's room. Chaplain enquire about patient's progress. Patient was doing much better. Chaplain comforted family member and assured her that they were always remembered in Chaplain's thoughts and prayers. Chaplain promise to pray for patient as he prepare to have a surgery on Friday. Family expressed her appreciation for Chaplain's spiritual support. Chaplain will continue to visit patient and family as needed at a later time to provide spiritual care.

## 2012-02-23 ENCOUNTER — Encounter (HOSPITAL_COMMUNITY): Payer: Self-pay | Admitting: Anesthesiology

## 2012-02-23 ENCOUNTER — Inpatient Hospital Stay (HOSPITAL_COMMUNITY): Payer: Medicare HMO

## 2012-02-23 ENCOUNTER — Encounter (HOSPITAL_COMMUNITY)
Admission: AD | Disposition: A | Discharge status: Home / Self Care | Payer: Self-pay | Source: Other Acute Inpatient Hospital | Attending: Cardiothoracic Surgery

## 2012-02-23 ENCOUNTER — Inpatient Hospital Stay (HOSPITAL_COMMUNITY): Payer: Medicare HMO | Admitting: Anesthesiology

## 2012-02-23 DIAGNOSIS — C160 Malignant neoplasm of cardia: Secondary | ICD-10-CM

## 2012-02-23 HISTORY — PX: GASTRECTOMY: SHX58

## 2012-02-23 HISTORY — PX: JEJUNOSTOMY: SHX313

## 2012-02-23 LAB — BASIC METABOLIC PANEL
BUN: 8 mg/dL (ref 6–23)
CO2: 26 mEq/L (ref 19–32)
Calcium: 7.7 mg/dL — ABNORMAL LOW (ref 8.4–10.5)
Chloride: 103 mEq/L (ref 96–112)
Creatinine, Ser: 0.7 mg/dL (ref 0.50–1.35)
GFR calc Af Amer: >90 mL/min (ref >90)
GFR calc non Af Amer: 89 mL/min — ABNORMAL LOW (ref >90)
Glucose, Bld: 131 mg/dL — ABNORMAL HIGH (ref 70–99)
Potassium: 4.2 mEq/L (ref 3.5–5.1)
Sodium: 137 mEq/L (ref 135–145)

## 2012-02-23 LAB — POCT I-STAT 3, ART BLOOD GAS (G3+)
O2 Saturation: 99 %
TCO2: 28 mmol/L (ref 0–100)
TCO2: 30 mmol/L (ref 0–100)
pCO2 arterial: 40.6 mmHg (ref 35.0–45.0)
pCO2 arterial: 43.9 mmHg (ref 35.0–45.0)
pH, Arterial: 7.42 (ref 7.350–7.450)
pO2, Arterial: 145 mmHg — ABNORMAL HIGH (ref 80.0–100.0)

## 2012-02-23 LAB — CBC
HCT: 28.1 % — ABNORMAL LOW (ref 39.0–52.0)
Hemoglobin: 9.5 g/dL — ABNORMAL LOW (ref 13.0–17.0)
MCH: 29.6 pg (ref 26.0–34.0)
MCHC: 33.8 g/dL (ref 30.0–36.0)
MCV: 87.5 fL (ref 78.0–100.0)
Platelets: 145 10*3/uL — ABNORMAL LOW (ref 150–400)
RBC: 3.21 MIL/uL — ABNORMAL LOW (ref 4.22–5.81)
RDW: 14.8 % (ref 11.5–15.5)
WBC: 10 10*3/uL (ref 4.0–10.5)

## 2012-02-23 SURGERY — GASTRECTOMY, TOTAL
Anesthesia: General | Site: Abdomen | Wound class: Clean Contaminated

## 2012-02-23 MED ORDER — POTASSIUM CHLORIDE 10 MEQ/50ML IV SOLN
10.0000 meq | INTRAVENOUS | Status: DC | PRN
  Filled 2012-02-23 (×2): qty 150
  Filled 2012-02-23: qty 50

## 2012-02-23 MED ORDER — HYDROMORPHONE HCL PF 1 MG/ML IJ SOLN
INTRAMUSCULAR | Status: DC | PRN
  Administered 2012-02-23: 0.5 mg via INTRAVENOUS
  Administered 2012-02-23: .5 mg via INTRAVENOUS

## 2012-02-23 MED ORDER — PROPOFOL 10 MG/ML IV BOLUS
INTRAVENOUS | Status: DC | PRN
  Administered 2012-02-23: 20 mg via INTRAVENOUS
  Administered 2012-02-23: 180 mg via INTRAVENOUS

## 2012-02-23 MED ORDER — LABETALOL HCL 5 MG/ML IV SOLN
INTRAVENOUS | Status: DC | PRN
  Administered 2012-02-23 (×3): 2.5 mg via INTRAVENOUS

## 2012-02-23 MED ORDER — VECURONIUM BROMIDE 10 MG IV SOLR
INTRAVENOUS | Status: DC | PRN
  Administered 2012-02-23: 4 mg via INTRAVENOUS
  Administered 2012-02-23 (×2): 5 mg via INTRAVENOUS

## 2012-02-23 MED ORDER — DEXTROSE 5 % IV SOLN
1.0000 g | Freq: Four times a day (QID) | INTRAVENOUS | Status: AC
  Administered 2012-02-23 – 2012-02-24 (×4): 1 g via INTRAVENOUS
  Filled 2012-02-23 (×4): qty 1

## 2012-02-23 MED ORDER — DIPHENHYDRAMINE HCL 12.5 MG/5ML PO ELIX
12.5000 mg | ORAL_SOLUTION | Freq: Four times a day (QID) | ORAL | Status: DC | PRN
  Filled 2012-02-23: qty 5

## 2012-02-23 MED ORDER — ONDANSETRON HCL 4 MG/2ML IJ SOLN: 4.0000 mg | Freq: Four times a day (QID) | INTRAMUSCULAR | Status: DC | PRN

## 2012-02-23 MED ORDER — SODIUM CHLORIDE 0.9 % IJ SOLN: 9.0000 mL | INTRAMUSCULAR | Status: DC | PRN

## 2012-02-23 MED ORDER — NALOXONE HCL 0.4 MG/ML IJ SOLN: 0.4000 mg | INTRAMUSCULAR | Status: DC | PRN

## 2012-02-23 MED ORDER — DEXMEDETOMIDINE HCL IN NACL 400 MCG/100ML IV SOLN
0.4000 ug/kg/h | INTRAVENOUS | Status: DC
  Administered 2012-02-23: 0.3 ug/kg/h via INTRAVENOUS
  Filled 2012-02-23: qty 100

## 2012-02-23 MED ORDER — KCL IN DEXTROSE-NACL 20-5-0.45 MEQ/L-%-% IV SOLN
INTRAVENOUS | Status: DC
  Administered 2012-02-23 – 2012-02-24 (×2): via INTRAVENOUS
  Administered 2012-02-24 – 2012-02-25 (×2): 1000 mL via INTRAVENOUS
  Administered 2012-02-25 – 2012-03-05 (×4): via INTRAVENOUS
  Filled 2012-02-23 (×17): qty 1000

## 2012-02-23 MED ORDER — ROCURONIUM BROMIDE 100 MG/10ML IV SOLN
INTRAVENOUS | Status: DC | PRN
  Administered 2012-02-23: 50 mg via INTRAVENOUS

## 2012-02-23 MED ORDER — DIPHENHYDRAMINE HCL 50 MG/ML IJ SOLN: 12.5000 mg | Freq: Four times a day (QID) | INTRAMUSCULAR | Status: DC | PRN

## 2012-02-23 MED ORDER — ALBUTEROL SULFATE (5 MG/ML) 0.5% IN NEBU
2.5000 mg | INHALATION_SOLUTION | Freq: Four times a day (QID) | RESPIRATORY_TRACT | Status: DC
  Administered 2012-02-23 – 2012-02-24 (×4): 2.5 mg via RESPIRATORY_TRACT
  Filled 2012-02-23 (×4): qty 0.5

## 2012-02-23 MED ORDER — PANTOPRAZOLE SODIUM 40 MG IV SOLR: 40.0000 mg | Freq: Two times a day (BID) | INTRAVENOUS | Status: DC

## 2012-02-23 MED ORDER — FENTANYL 10 MCG/ML IV SOLN
INTRAVENOUS | Status: DC
  Administered 2012-02-23: 22:00:00 via INTRAVENOUS
  Administered 2012-02-24: 145.6 ug via INTRAVENOUS
  Administered 2012-02-24: 60 ug via INTRAVENOUS
  Administered 2012-02-24: 12:00:00 via INTRAVENOUS
  Administered 2012-02-24: 105 ug via INTRAVENOUS
  Administered 2012-02-24: 150 ug via INTRAVENOUS
  Administered 2012-02-24: 135 ug via INTRAVENOUS
  Administered 2012-02-25 (×2): 45 ug via INTRAVENOUS
  Administered 2012-02-25: 35 ug via INTRAVENOUS
  Administered 2012-02-25: 75 ug via INTRAVENOUS
  Administered 2012-02-25: 12:00:00 via INTRAVENOUS
  Administered 2012-02-26: 90 ug via INTRAVENOUS
  Administered 2012-02-26: 16:00:00 via INTRAVENOUS
  Administered 2012-02-26: 135 ug via INTRAVENOUS
  Administered 2012-02-26: 60 ug via INTRAVENOUS
  Administered 2012-02-26: 45 ug via INTRAVENOUS
  Administered 2012-02-26: 177.9 ug via INTRAVENOUS
  Administered 2012-02-27: 13:00:00 via INTRAVENOUS
  Administered 2012-02-27: 125 ug via INTRAVENOUS
  Administered 2012-02-27: 105 ug via INTRAVENOUS
  Administered 2012-02-27 (×2): 60 ug via INTRAVENOUS
  Administered 2012-02-27: 90 ug via INTRAVENOUS
  Administered 2012-02-27: 30 ug via INTRAVENOUS
  Administered 2012-02-28: 18:00:00 via INTRAVENOUS
  Administered 2012-02-28: 60 ug via INTRAVENOUS
  Administered 2012-02-28: 105 ug via INTRAVENOUS
  Administered 2012-02-28: 111 ug via INTRAVENOUS
  Administered 2012-02-28: 15 ug via INTRAVENOUS
  Administered 2012-02-29: 60 ug via INTRAVENOUS
  Administered 2012-02-29: 23:00:00 via INTRAVENOUS
  Administered 2012-02-29: 45 ug/h via INTRAVENOUS
  Administered 2012-02-29: 30 ug via INTRAVENOUS
  Administered 2012-02-29: 150 ug via INTRAVENOUS
  Administered 2012-02-29: 90 ug/h via INTRAVENOUS
  Filled 2012-02-23 (×8): qty 50

## 2012-02-23 MED ORDER — PHENYLEPHRINE HCL 10 MG/ML IJ SOLN
INTRAMUSCULAR | Status: DC | PRN
  Administered 2012-02-23 (×2): 40 ug via INTRAVENOUS
  Administered 2012-02-23: 80 ug via INTRAVENOUS
  Administered 2012-02-23: 40 ug via INTRAVENOUS
  Administered 2012-02-23 (×3): 80 ug via INTRAVENOUS
  Administered 2012-02-23: 40 ug via INTRAVENOUS

## 2012-02-23 MED ORDER — MIDAZOLAM HCL 5 MG/5ML IJ SOLN
INTRAMUSCULAR | Status: DC | PRN
  Administered 2012-02-23: 2 mg via INTRAVENOUS

## 2012-02-23 MED ORDER — NITROGLYCERIN IN D5W 200-5 MCG/ML-% IV SOLN
2.0000 ug/min | INTRAVENOUS | Status: DC
  Administered 2012-02-23: 10 ug/min via INTRAVENOUS
  Filled 2012-02-23: qty 250

## 2012-02-23 MED ORDER — ALBUMIN HUMAN 5 % IV SOLN
INTRAVENOUS | Status: DC | PRN
  Administered 2012-02-23 (×3): via INTRAVENOUS

## 2012-02-23 MED ORDER — SODIUM CHLORIDE 0.9 % IV SOLN
INTRAVENOUS | Status: DC | PRN
  Administered 2012-02-23: 08:00:00 via INTRAVENOUS

## 2012-02-23 MED ORDER — ARTIFICIAL TEARS OP OINT
TOPICAL_OINTMENT | OPHTHALMIC | Status: DC | PRN
  Administered 2012-02-23: 1 via OPHTHALMIC

## 2012-02-23 MED ORDER — 0.9 % SODIUM CHLORIDE (POUR BTL) OPTIME
TOPICAL | Status: DC | PRN
  Administered 2012-02-23: 1000 mL

## 2012-02-23 MED ORDER — FENTANYL CITRATE 0.05 MG/ML IJ SOLN
INTRAMUSCULAR | Status: DC | PRN
  Administered 2012-02-23: 50 ug via INTRAVENOUS
  Administered 2012-02-23 (×3): 100 ug via INTRAVENOUS
  Administered 2012-02-23 (×2): 50 ug via INTRAVENOUS
  Administered 2012-02-23: 100 ug via INTRAVENOUS
  Administered 2012-02-23: 150 ug via INTRAVENOUS
  Administered 2012-02-23: 50 ug via INTRAVENOUS

## 2012-02-23 MED ORDER — ONDANSETRON HCL 4 MG/2ML IJ SOLN: 4.0000 mg | INTRAMUSCULAR | Status: DC | PRN

## 2012-02-23 MED ORDER — SODIUM CHLORIDE 0.9 % IJ SOLN
10.0000 mL | Freq: Two times a day (BID) | INTRAMUSCULAR | Status: DC
  Administered 2012-02-23 – 2012-03-06 (×8): 10 mL via INTRAVENOUS

## 2012-02-23 MED ORDER — LACTATED RINGERS IV SOLN
INTRAVENOUS | Status: DC | PRN
  Administered 2012-02-23 (×2): via INTRAVENOUS

## 2012-02-23 MED ORDER — SUCCINYLCHOLINE CHLORIDE 20 MG/ML IJ SOLN
INTRAMUSCULAR | Status: DC | PRN
  Administered 2012-02-23: 100 mg via INTRAVENOUS

## 2012-02-23 MED ORDER — LACTATED RINGERS IV SOLN
INTRAVENOUS | Status: DC | PRN
  Administered 2012-02-23 (×3): via INTRAVENOUS

## 2012-02-23 MED ORDER — DEXMEDETOMIDINE HCL IN NACL 200 MCG/50ML IV SOLN: INTRAVENOUS | Status: DC | PRN

## 2012-02-23 MED ORDER — ACETAMINOPHEN 10 MG/ML IV SOLN
1000.0000 mg | Freq: Four times a day (QID) | INTRAVENOUS | Status: AC
  Administered 2012-02-23 – 2012-02-24 (×4): 1000 mg via INTRAVENOUS
  Filled 2012-02-23 (×4): qty 100

## 2012-02-23 MED ORDER — CEFOXITIN SODIUM 2 G IV SOLR
2.0000 g | INTRAVENOUS | Status: AC
  Administered 2012-02-23: 2 g via INTRAVENOUS
  Filled 2012-02-23: qty 2

## 2012-02-23 MED ORDER — FENTANYL CITRATE 0.05 MG/ML IJ SOLN
INTRAMUSCULAR | Status: DC | PRN
  Administered 2012-02-23 (×2): 50 ug via INTRAVENOUS

## 2012-02-23 MED ORDER — EPHEDRINE SULFATE 50 MG/ML IJ SOLN
INTRAMUSCULAR | Status: DC | PRN
  Administered 2012-02-23 (×2): 5 mg via INTRAVENOUS
  Administered 2012-02-23: 10 mg via INTRAVENOUS
  Administered 2012-02-23: 5 mg via INTRAVENOUS

## 2012-02-23 SURGICAL SUPPLY — 139 items
ADH SKN CLS APL DERMABOND .7 (GAUZE/BANDAGES/DRESSINGS) ×6
ADH SKN CLS LQ APL DERMABOND (GAUZE/BANDAGES/DRESSINGS) ×3
APPLICATOR COTTON TIP 6IN STRL (MISCELLANEOUS) ×2 IMPLANT
BANDAGE HEMOSTAT MRDH 4X4 STRL (MISCELLANEOUS) IMPLANT
BLADE SURG 10 STRL SS (BLADE) ×2 IMPLANT
BLADE SURG ROTATE 9660 (MISCELLANEOUS) ×2 IMPLANT
BNDG HEMOSTAT MRDH 4X4 STRL (MISCELLANEOUS)
CANISTER SUCTION 2500CC (MISCELLANEOUS) ×8 IMPLANT
CATH FOLEY 2WAY SLVR 18FR 30CC (CATHETERS) ×4 IMPLANT
CATH FOLEY 3WAY 30CC 20FR (CATHETERS) ×2 IMPLANT
CATH ROBINSON RED A/P 18FR (CATHETERS) ×6 IMPLANT
CATH THORACIC 28FR (CATHETERS) IMPLANT
CATH THORACIC 36FR (CATHETERS) IMPLANT
CATH THORACIC 36FR RT ANG (CATHETERS) IMPLANT
CLIP FOGARTY SPRING 6M (CLIP) ×4 IMPLANT
CLIP TI LARGE 6 (CLIP) IMPLANT
CLIP TI MEDIUM 24 (CLIP) ×6 IMPLANT
CLIP TI WIDE RED SMALL 24 (CLIP) ×4 IMPLANT
CLOTH BEACON ORANGE TIMEOUT ST (SAFETY) ×4 IMPLANT
COVER SURGICAL LIGHT HANDLE (MISCELLANEOUS) ×4 IMPLANT
DERMABOND ADHESIVE PROPEN (GAUZE/BANDAGES/DRESSINGS) ×1
DERMABOND ADVANCED (GAUZE/BANDAGES/DRESSINGS) ×2
DERMABOND ADVANCED .7 DNX12 (GAUZE/BANDAGES/DRESSINGS) ×6 IMPLANT
DERMABOND ADVANCED .7 DNX6 (GAUZE/BANDAGES/DRESSINGS) ×1 IMPLANT
DISSECTOR BLUNT TIP ENDO 5MM (MISCELLANEOUS) ×2 IMPLANT
DRAIN PENROSE 1/2X12 LTX STRL (WOUND CARE) ×2 IMPLANT
DRAIN PENROSE 1/2X36 STERILE (WOUND CARE) ×4 IMPLANT
DRAIN SUMP SARATOGA 24F (WOUND CARE) ×4 IMPLANT
DRAPE BILATERAL SPLIT (DRAPES) ×4 IMPLANT
DRAPE CAMERA VIDEO/LASER (DRAPES) ×4 IMPLANT
DRAPE CV SPLIT W-CLR ANES SCRN (DRAPES) ×4 IMPLANT
DRAPE INCISE IOBAN 66X45 STRL (DRAPES) ×8 IMPLANT
DRAPE SLUSH/WARMER DISC (DRAPES) ×4 IMPLANT
DRAPE STERI WOUND 35X35 8 5/8 (DRAPES) ×4 IMPLANT
DRSG COVADERM 4X14 (GAUZE/BANDAGES/DRESSINGS) ×4 IMPLANT
ELECT BLADE 4.0 EZ CLEAN MEGAD (MISCELLANEOUS) ×4
ELECT BLADE 6.5 EXT (BLADE) ×4 IMPLANT
ELECT NDL TIP 2.8 STRL (NEEDLE) ×2 IMPLANT
ELECT NEEDLE TIP 2.8 STRL (NEEDLE) ×4 IMPLANT
ELECT REM PT RETURN 9FT ADLT (ELECTROSURGICAL) ×8
ELECTRODE BLDE 4.0 EZ CLN MEGD (MISCELLANEOUS) ×3 IMPLANT
ELECTRODE REM PT RTRN 9FT ADLT (ELECTROSURGICAL) ×6 IMPLANT
GEL ULTRASOUND 20GR AQUASONIC (MISCELLANEOUS) ×2 IMPLANT
GLOVE BIO SURGEON STRL SZ 6.5 (GLOVE) ×14 IMPLANT
GLOVE BIO SURGEON STRL SZ7.5 (GLOVE) ×2 IMPLANT
GLOVE BIO SURGEON STRL SZ8 (GLOVE) ×4 IMPLANT
GLOVE BIOGEL PI IND STRL 7.5 (GLOVE) ×1 IMPLANT
GLOVE BIOGEL PI INDICATOR 7.5 (GLOVE) ×1
GOWN STRL NON-REIN LRG LVL3 (GOWN DISPOSABLE) ×12 IMPLANT
HANDLE STAPLE ENDO GIA SHORT (STAPLE) ×1
HEMOSTAT SURGICEL 2X14 (HEMOSTASIS) IMPLANT
INSERT FOGARTY 61MM (MISCELLANEOUS) IMPLANT
KIT BASIN OR (CUSTOM PROCEDURE TRAY) ×4 IMPLANT
KIT ROOM TURNOVER OR (KITS) ×4 IMPLANT
KIT SUCTION CATH 14FR (SUCTIONS) ×4 IMPLANT
LIGASURE IMPACT 36 18CM CVD LR (INSTRUMENTS) ×4 IMPLANT
LOOP VESSEL MINI RED (MISCELLANEOUS) IMPLANT
NS IRRIG 1000ML POUR BTL (IV SOLUTION) ×8 IMPLANT
PACK CHEST (CUSTOM PROCEDURE TRAY) ×4 IMPLANT
PAD ARMBOARD 7.5X6 YLW CONV (MISCELLANEOUS) ×8 IMPLANT
PAD SHARPS MAGNETIC DISPOSAL (MISCELLANEOUS) ×4 IMPLANT
PENCIL BUTTON HOLSTER BLD 10FT (ELECTRODE) IMPLANT
PLUG CATH AND CAP STER (CATHETERS) ×6 IMPLANT
PROBE NERVBE PRASS .33 (MISCELLANEOUS) ×4 IMPLANT
RELOAD BLUE (STAPLE) ×4 IMPLANT
RELOAD EGIA 45 MED/THCK PURPLE (STAPLE) ×8 IMPLANT
RELOAD EGIA TRIS TAN 45 CVD (STAPLE) ×8 IMPLANT
RELOAD STAPLE 30 GRN THCK TA (STAPLE) IMPLANT
RELOAD STAPLE 45 TAN MED CVD (STAPLE) ×4 IMPLANT
RELOAD STAPLE 80 GRN THCK (STAPLE) ×8 IMPLANT
RELOAD STAPLE GIA80 4.8 GRN (STAPLE) ×16 IMPLANT
RELOAD STAPLE TA30 4.8 GRN (STAPLE) ×4 IMPLANT
RETAINER VISCERA MED (MISCELLANEOUS) ×6 IMPLANT
SCALPEL HARMONIC ACE (MISCELLANEOUS) ×2 IMPLANT
SET IRRIG TUBING LAPAROSCOPIC (IRRIGATION / IRRIGATOR) ×2 IMPLANT
SLEEVE ENDOPATH XCEL 5M (ENDOMECHANICALS) ×8 IMPLANT
SOLUTION ANTI FOG 6CC (MISCELLANEOUS) ×2 IMPLANT
SPONGE GAUZE 4X4 12PLY (GAUZE/BANDAGES/DRESSINGS) ×4 IMPLANT
SPONGE LAP 18X18 X RAY DECT (DISPOSABLE) ×8 IMPLANT
SPONGE TONSIL 1.25 RF SGL STRG (GAUZE/BANDAGES/DRESSINGS) IMPLANT
STAPLE ECHEON FLEX 60 POW ENDO (STAPLE) ×2 IMPLANT
STAPLER CIRC CVD 21MM 37MM (STAPLE) ×2 IMPLANT
STAPLER ENDO GIA 12 SHRT THIN (STAPLE) ×2 IMPLANT
STAPLER ENDO GIA 12MM SHORT (STAPLE) ×3 IMPLANT
STAPLER GIA TA80 3.8MM WHT SNG (STAPLE) ×4 IMPLANT
STAPLER TA30 4.8 NON-ABS (STAPLE) ×2 IMPLANT
STAPLER VISISTAT 35W (STAPLE) ×6 IMPLANT
SUCTION POOLE TIP (SUCTIONS) IMPLANT
SUT ETHILON 2 0 FS 18 (SUTURE) ×4 IMPLANT
SUT ETHILON 3 0 FSL (SUTURE) ×4 IMPLANT
SUT PDS AB 1 TP1 96 (SUTURE) ×4 IMPLANT
SUT PDS AB 4-0 SH 27 (SUTURE) IMPLANT
SUT PROLENE 0 CT 1 CR/8 (SUTURE) IMPLANT
SUT PROLENE 1 XLH (SUTURE) ×4 IMPLANT
SUT PROLENE 2 0 MH 48 (SUTURE) IMPLANT
SUT PROLENE 2 0 SH DA (SUTURE) ×18 IMPLANT
SUT PROLENE 2 TP 1 (SUTURE) ×8 IMPLANT
SUT PROLENE 3 0 RB 1 (SUTURE) IMPLANT
SUT PROLENE 4 0 RB 1 (SUTURE)
SUT PROLENE 4 0 SH DA (SUTURE) ×4 IMPLANT
SUT PROLENE 4-0 RB1 .5 CRCL 36 (SUTURE) IMPLANT
SUT SILK  1 MH (SUTURE) ×1
SUT SILK 1 MH (SUTURE) ×3 IMPLANT
SUT SILK 1 TIES 10X30 (SUTURE) ×4 IMPLANT
SUT SILK 2 0 SH CR/8 (SUTURE) ×6 IMPLANT
SUT SILK 2 0SH CR/8 30 (SUTURE) ×4 IMPLANT
SUT SILK 3 0 SH CR/8 (SUTURE) IMPLANT
SUT SILK 3 0SH CR/8 30 (SUTURE) IMPLANT
SUT SILK 4 0 SH CR/8 (SUTURE) ×4 IMPLANT
SUT VIC AB 1 CTX 27 (SUTURE) IMPLANT
SUT VIC AB 2-0 CT1 18 (SUTURE) IMPLANT
SUT VIC AB 2-0 CTX 36 (SUTURE) ×8 IMPLANT
SUT VIC AB 2-0 UR6 27 (SUTURE) ×2 IMPLANT
SUT VIC AB 3-0 MH 27 (SUTURE) IMPLANT
SUT VIC AB 3-0 SH 18 (SUTURE) IMPLANT
SUT VIC AB 3-0 X1 27 (SUTURE) ×8 IMPLANT
SUT VIC AB 4-0 SH 18 (SUTURE) ×12 IMPLANT
SUT VICRYL 2 TP 1 (SUTURE) IMPLANT
SYR 20CC LL (SYRINGE) IMPLANT
SYR 20ML ECCENTRIC (SYRINGE) ×2 IMPLANT
SYR 30ML SLIP (SYRINGE) ×4 IMPLANT
SYR 5ML LUER SLIP (SYRINGE) ×4 IMPLANT
SYR TOOMEY 50ML (SYRINGE) ×4 IMPLANT
SYRINGE 10CC LL (SYRINGE) IMPLANT
SYSTEM SAHARA CHEST DRAIN RE-I (WOUND CARE) ×4 IMPLANT
TAPE CLOTH SOFT 2X10 (GAUZE/BANDAGES/DRESSINGS) ×2 IMPLANT
TAPE UMBILICAL 1/8 X36 TWILL (MISCELLANEOUS) IMPLANT
TOWEL OR 17X24 6PK STRL BLUE (TOWEL DISPOSABLE) ×8 IMPLANT
TOWEL OR 17X26 10 PK STRL BLUE (TOWEL DISPOSABLE) ×10 IMPLANT
TRAY FOLEY CATH 14FRSI W/METER (CATHETERS) ×4 IMPLANT
TROCAR XCEL NON-BLD 11X100MML (ENDOMECHANICALS) ×6 IMPLANT
TROCAR XCEL NON-BLD 5MMX100MML (ENDOMECHANICALS) ×2 IMPLANT
TUBE CONNECTING 12X1/4 (SUCTIONS) IMPLANT
TUBE ENDOTRAC EMG 7X10.2 (MISCELLANEOUS) IMPLANT
TUBE ENDOTRAC EMG 8X11.3 (MISCELLANEOUS) IMPLANT
TUBE ENDOTRACH  EMG 6MMTUBE EN (MISCELLANEOUS)
TUBE ENDOTRACH EMG 6MMTUBE EN (MISCELLANEOUS) IMPLANT
TUBING INSUFFLATION 10FT LAP (TUBING) ×2 IMPLANT
WATER STERILE IRR 1000ML POUR (IV SOLUTION) ×4 IMPLANT

## 2012-02-23 NOTE — Addendum Note (Signed)
Addendum  created 02/23/12 1629 by Hart Robinsons, MD   Modules edited:Anesthesia Attestations

## 2012-02-23 NOTE — Procedures (Addendum)
Extubation Procedure Note  Patient Details:   Name: FUQUAN WILSON DOB: 1932/08/05 MRN: [id number]   Airway Documentation:     Evaluation  O2 sats: stable throughout Complications: No apparent complications Patient did tolerate procedure well. Bilateral Breath Sounds: Clear;Diminished   Yes Nif-24, VC 1.2 cmh20 Joylene John 02/23/2012, 4:51 PM

## 2012-02-23 NOTE — Transfer of Care (Signed)
Immediate Anesthesia Transfer of Care Note  Patient: [name]  Procedure(s) Performed: Procedure(s) (LRB) with comments: GASTRECTOMY (N/A) - esophogastrectomy, proximal third stomach and distal esophagus JEJUNOSTOMY (Left)  Patient Location: SICU  Anesthesia Type: General  Level of Consciousness: sedated and Patient remains intubated per anesthesia plan  Airway & Oxygen Therapy: Patient remains intubated per anesthesia plan and Patient placed on Ventilator (see vital sign flow sheet for setting)  Post-op Assessment: Report given to PACU RN and Post -op Vital signs reviewed and stable  Post vital signs: Reviewed and stable  Complications: No apparent anesthesia complications

## 2012-02-23 NOTE — Anesthesia Postprocedure Evaluation (Signed)
  Anesthesia Post-op Note  Patient: [name]  Procedure(s) Performed: Procedure(s) (LRB) with comments: GASTRECTOMY (N/A) - esophogastrectomy, proximal third stomach and distal esophagus JEJUNOSTOMY (Left)  Patient Location: SICU  Anesthesia Type: General  Level of Consciousness: sedated and Patient remains intubated per anesthesia plan  Airway and Oxygen Therapy: Patient remains intubated per anesthesia plan and Patient placed on Ventilator (see vital sign flow sheet for setting)  Post-op Pain: none  Post-op Assessment: Post-op Vital signs reviewed and Patient's Cardiovascular Status Stable  Post-op Vital Signs: Reviewed and stable  Complications: No apparent anesthesia complications

## 2012-02-23 NOTE — Anesthesia Preprocedure Evaluation (Signed)
Anesthesia Evaluation  Patient identified by MRN, date of birth, ID band Patient awake    Reviewed: Allergy & Precautions, H&P , NPO status , Patient's Chart, lab work & pertinent test results, reviewed documented beta blocker date and time   Airway Mallampati: II TM Distance: >3 FB Neck ROM: full    Dental   Pulmonary COPD COPD inhaler,  breath sounds clear to auscultation        Cardiovascular hypertension, + Valvular Problems/Murmurs Rhythm:regular     Neuro/Psych negative neurological ROS  negative psych ROS   GI/Hepatic negative GI ROS, Neg liver ROS,   Endo/Other  negative endocrine ROS  Renal/GU negative Renal ROS  negative genitourinary   Musculoskeletal   Abdominal   Peds  Hematology negative hematology ROS (+)   Anesthesia Other Findings See surgeon's H&P   Reproductive/Obstetrics negative OB ROS                           Anesthesia Physical Anesthesia Plan  ASA: III  Anesthesia Plan: General   Post-op Pain Management:    Induction: Intravenous  Airway Management Planned: Oral ETT  Additional Equipment: Arterial line and CVP  Intra-op Plan:   Post-operative Plan: Post-operative intubation/ventilation  Informed Consent: I have reviewed the patients History and Physical, chart, labs and discussed the procedure including the risks, benefits and alternatives for the proposed anesthesia with the patient or authorized representative who has indicated his/her understanding and acceptance.   Dental Advisory Given  Plan Discussed with: CRNA and Surgeon  Anesthesia Plan Comments:         Anesthesia Quick Evaluation

## 2012-02-23 NOTE — Procedures (Signed)
Co-Surgeon: Wenda Low, MD, FACS  Co-Surgeon Ofilia Neas, MD, FACS  Anes:  General  Procedure: Laparoscopic staging and mobilization of the foregut, endoscopy, distal esophageal/proximal gastric resection with esophagogastrostomy, feeding jejunostomy  Diagnosis: Cancer at the EG junction  Complications: None   EBL:   40  cc  Description of Procedure:  The patient was taken to room 14 at [facility] and given general anesthesia. The abdomen chest and neck were prepped with Betadine and draped with an Ioban drape. Access to the abdomen was achieved with a 5 mm Optiview the left upper quadrant and then subsequent 5 mm were placed to the left of the umbilicus to on the right and a 5 mm in the upper midline for the [facility] retractor. Eventually the 1 on the right side that was lower was increased to a 11 mm. The foregut dissection began on the right side by incising the gastrohepatic membrane and going up exposing the right crus. Right crus was dissected and then carried anteriorly a taking down the tissue overlying the esophagus and one over to the left crus. The short gastrics the upper portion the stomach were taken down with harmonic scalpel and we freed the stomach free well proximally and dissected behind it from the right and passed a Penrose drain. With the Penrose drain in place we then dissected the esophagus up into the chest and mediastinum there is no evidence of any tumor in the distal esophageal region.  We then passed the endoscope and visualized the tumor on retroflexion and noted to be in the stomach posteriorly. At this point we thought a possible approaches to this resection both laparoscopically and open. The area of embolization of left gastric artery was quite thickened and was somewhat confusing with possible adenopathy. We elected to open the upper abdomen and proceed with proximal gastrectomy and distal esophagectomy. We measured about the proximal third of the stomach and first  working along the lesser curvature and going down and skeletonizing a very hard left gastric vessel that contained the embolic coils.   From that point we used the Echelon stapler with blue loads to transect the stomach with 2 firings of the 6 cm stapler. We carried the greater curvature side dissection proximally to completely mobilize this up to the left crus. At that point we transected the left gastric artery and oversewed it with a suture ligature were 2-0 Prolene.  The specimen was mobilized. We could feel the tumor and we were above this feeling up on the esophagus we had mobilized. We at this point put some stay sutures on either side of the esophagus using 2-0 Prolene. We passed a TA stapler giving [place] approximately 2 cm proximal margin where we then clamped and fired and then transected above the stapler to leave [place] with an open esophagus. The speciment was sent to pathology for clearance prior to final anastomosis.    We then used a 21 Ethicon end to end anastomosing stapler (Stealth) and created a pursestring of 4-0 Prolene to involve the distal esophagus including the squamous mucosa. Pexing sutures were used to hold squamous mucosa in place.  When the pursestring was completed we then inserted the anvil and tied it in place.  The distal stomach was then opened anteriorally  and the EEA passed with the stem coming out the backside. This was then coupled to the anvil and was closed to the mid range of staple height and then fired. It was removed without difficulty. We had 2  complete rings and we checked and these were all sent to pathology. The frozen section report had previously come back with negative margins and at least a 1 cm margin proximally.  The anterior gastrotomy was then closed with a TA 30 green load. Prior to this closure we   Re-endoscoped the patient. The scope in the left in place and were able to visualize the anastomosis, inflated with air, noticed that there were no bubbles from  the anastomosis. We attempted to pass an NG tube but it would not go alongside the endoscope and we felt that we did not really want to traumatize the fresh staple line and elected to leave it out.  Everything looked good. Hemostasis was present.  We identified the ligament of Treitz and went down lumen about 18 inches to insert a jejunostomy.  We  created a red Robinson catheter [place] [employer] feeding jejunostomy implanting this for about 4 cm with multiple holes in the red [place] catheter and securing it with a 4-0 Prolene. The entire device catheter and all was then secured the intra-abdominal wall with interrupted 4-0 silk sutures and to the skin with 4-0 nylon.   Sponge and needle counts reported as correct. The incision was closed with  double-stranded #1 PDS from above and below. All wounds were then closed with staples and the small trocar incisions were closed with vicryl and staples.  The patient tolerated the procedure well and was taken to intensive care unit to the room from which he came.  Matt B. Daphine Deutscher, MD, [facility], [place] [phone]

## 2012-02-23 NOTE — Preoperative (Signed)
Beta Blockers   Reason not to administer Beta Blockers:Not Applicable 

## 2012-02-23 NOTE — Progress Notes (Signed)
Bladder emptied and patient transported to pre-op holding area.  Report given to CRNA at bedside.  Patient on monitor.  VSS.

## 2012-02-23 NOTE — Anesthesia Procedure Notes (Signed)
Procedure Name: Intubation Date/Time: 02/23/2012 7:50 AM Performed by: Charm Barges, Yurianna Tusing R Pre-anesthesia Checklist: Patient identified, Timeout performed, Emergency Drugs available, Suction available and Patient being monitored Patient Re-evaluated:Patient Re-evaluated prior to inductionOxygen Delivery Method: Circle system utilized Preoxygenation: Pre-oxygenation with 100% oxygen Intubation Type: IV induction and Rapid sequence Laryngoscope Size: Miller and 2 Grade View: Grade I Tube type: Oral Tube size: 8.0 mm Number of attempts: 1 Airway Equipment and Method: Stylet and Patient positioned with wedge pillow Placement Confirmation: ETT inserted through vocal cords under direct vision,  breath sounds checked- equal and bilateral and positive ETCO2 Secured at: 23 cm Tube secured with: Tape Dental Injury: Teeth and Oropharynx as per pre-operative assessment

## 2012-02-23 NOTE — Progress Notes (Signed)
S/p Esophagogastrectomy  Extubated  Comfortable  BP 153/40  Pulse 91  Temp 98.3 F (36.8 C) (Oral)  Resp 20  Ht 5\' 7"  (1.702 m)  Wt 176 lb 5.9 oz (80 kg)  BMI 27.62 kg/m2  SpO2 99%   Intake/Output Summary (Last 24 hours) at 02/23/12 1712 Last data filed at 02/23/12 1700  Gross per 24 hour  Intake 6811.68 ml  Output   1332 ml  Net 5479.68 ml    Doing well early postop

## 2012-02-23 NOTE — Progress Notes (Addendum)
INITIAL ADULT NUTRITION ASSESSMENT Date: 02/23/2012   Time: 11:41 AM  INTERVENTION:  Once J-tube able to be used, recommend initiation of Vital AF 1.2 formula at 20 ml/hr and increase 10 ml every 6 hours to goal rate of 60 ml/hr to provide 1728 kcals, 108 gm protein, 1168 ml of free water RD to follow for nutrition care plan  Reason for Assessment: VDRF  ASSESSMENT: Male 78 y.o.  Dx: esophageal cancer  Hx:  Past Medical History  Diagnosis Date  . Anemia   . Hypertension   . COPD (chronic obstructive pulmonary disease)     Related Meds:     . cefOXitin  2 g Intravenous Once  . feeding supplement  1 Container Oral TID BM  . folic acid  1 mg Oral Daily  . pantoprazole (PROTONIX) IV  40 mg Intravenous Q12H  . prednisoLONE acetate  2 drop Right Eye QHS    Ht: 5\' 7"  (170.2 cm)  Wt: 177 lb 11.1 oz (80.6 kg)  Ideal Wt: 67.2 kg % Ideal Wt: 120%  Usual Wt: 82 kg % Usual Wt: 98%  Body mass index is 27.83 kg/(m^2).  Food/Nutrition Related Hx: no triggers per admission nutrition screen  Labs:  CMP     Component Value Date/Time   NA 138 02/22/2012 1215   K 4.1 02/22/2012 1215   CL 103 02/22/2012 1215   CO2 27 02/22/2012 1215   GLUCOSE 114* 02/22/2012 1215   BUN 7 02/22/2012 1215   CREATININE 0.69 02/22/2012 1215   CALCIUM 8.2* 02/22/2012 1215   PROT 5.4* 02/22/2012 1215   ALBUMIN 2.4* 02/22/2012 1215   AST 13 02/22/2012 1215   ALT 10 02/22/2012 1215   ALKPHOS 43 02/22/2012 1215   BILITOT 0.4 02/22/2012 1215   GFRNONAA 90* 02/22/2012 1215   GFRAA >90 02/22/2012 1215    Intake/Output Summary (Last 24 hours) at 02/23/12 1148 Last data filed at 02/23/12 1133  Gross per 24 hour  Intake   4130 ml  Output   1142 ml  Net   2988 ml    CBG (last 3)   Basename 02/20/12 1202  GLUCAP 102*    Diet Order: NPO  Supplements/Tube Feeding: N/A  IVF:    dexmedetomidine    Estimated Nutritional Needs:   Kcal: 1650-1750 Protein: 105-115 gm Fluid: 1.6-1.7  L  Patient with newly found esophageal mass after having some dark stools; underwent EGD with biopsy 10/11; AM of 10/12 developed acute hematemesis and transferred to SICU; seen by GI on 10/13 -- no further bleeding; diet advanced to Full Liquids 10/14.  Patient is currently intubated on ventilator support MV: 7.0 Temp: 37.1  Patient s/p laparoscopic staging and mobilization of the foregut, endoscopy, distal esophageal/proximal gastric resection with esophagogastrostomy and feeding jejunostomy 10/18.   NUTRITION DIAGNOSIS: -Inadequate oral intake (NI-2.1).  Status: Ongoing  RELATED TO: inability to eat, s/p esophagogastrostomy   AS EVIDENCE BY: NPO status  MONITORING/EVALUATION(Goals): Goal: EN to meet >/= 90% of estimated nutrition needs Monitor: EN regimen & tolerance, respiratory status, weight, labs, I/O's  EDUCATION NEEDS: -No education needs identified at this time  Dietitian #: [phone]  DOCUMENTATION CODES Per approved criteria  -Not Applicable    Alger Memos 02/23/2012, 11:41 AM

## 2012-02-23 NOTE — Brief Op Note (Signed)
02/15/2012 - 02/23/2012  1:14 PM  PATIENT:  [name]  78 y.o. male  PRE-OPERATIVE DIAGNOSIS:  Ca esophagus  POST-OPERATIVE DIAGNOSIS:  esophagus cancer  PROCEDURE:  Procedure(s) (LRB) with comments: ESOPHOGASTRECTOMY  (N/A) - PROXIMAL THIRD STOMACH AND DISTAL ESOPHAGUS ; jejunostomy  SURGEON:  Surgeon(s) and Role:    * Delight Ovens, MD - COSURGEON    Valarie Merino, MD - COSURGEON    ANESTHESIA:   general  EBL:  Total I/O In: 5850 [I.V.:4400; Blood:700; IV Piggyback:750] Out: 307 [Urine:207; Blood:100]  BLOOD ADMINISTERED:700 CC PRBC  DRAINS: Urinary Catheter (Foley), Jejunostomy Tube and RT ij AND ALINE   LOCAL MEDICATIONS USED:  NONE  SPECIMEN:  Source of Specimen:  distal esophagus and proximal stomach  DISPOSITION OF SPECIMEN:  PATHOLOGY  COUNTS:  YES   DICTATION: .Other Dictation: Dictation Number   PLAN OF CARE: Admit to inpatient   PATIENT DISPOSITION:  ICU - intubated and hemodynamically stable.   Delay start of Pharmacological VTE agent (>24hrs) due to surgical blood loss or risk of bleeding: yes

## 2012-02-24 ENCOUNTER — Inpatient Hospital Stay (HOSPITAL_COMMUNITY): Payer: Medicare HMO

## 2012-02-24 LAB — BASIC METABOLIC PANEL
BUN: 7 mg/dL (ref 6–23)
CO2: 25 mEq/L (ref 19–32)
Calcium: 7.4 mg/dL — ABNORMAL LOW (ref 8.4–10.5)
Chloride: 102 mEq/L (ref 96–112)
Creatinine, Ser: 0.63 mg/dL (ref 0.50–1.35)
GFR calc Af Amer: >90 mL/min (ref >90)
GFR calc non Af Amer: >90 mL/min (ref >90)
Glucose, Bld: 150 mg/dL — ABNORMAL HIGH (ref 70–99)
Potassium: 4.1 mEq/L (ref 3.5–5.1)
Sodium: 135 mEq/L (ref 135–145)

## 2012-02-24 LAB — TYPE AND SCREEN
ABO/RH(D): A POS
Antibody Screen: NEGATIVE
Unit division: 0

## 2012-02-24 LAB — CBC
HCT: 27 % — ABNORMAL LOW (ref 39.0–52.0)
Hemoglobin: 9 g/dL — ABNORMAL LOW (ref 13.0–17.0)
MCH: 28.8 pg (ref 26.0–34.0)
MCHC: 33.3 g/dL (ref 30.0–36.0)
MCV: 86.5 fL (ref 78.0–100.0)
Platelets: 154 10*3/uL (ref 150–400)
RBC: 3.12 MIL/uL — ABNORMAL LOW (ref 4.22–5.81)
RDW: 14.7 % (ref 11.5–15.5)
WBC: 9.2 10*3/uL (ref 4.0–10.5)

## 2012-02-24 LAB — POCT I-STAT 3, ART BLOOD GAS (G3+)
TCO2: 27 mmol/L (ref 0–100)
pCO2 arterial: 39.4 mmHg (ref 35.0–45.0)
pH, Arterial: 7.419 (ref 7.350–7.450)

## 2012-02-24 MED ORDER — ENOXAPARIN SODIUM 40 MG/0.4ML ~~LOC~~ SOLN
40.0000 mg | SUBCUTANEOUS | Status: DC
  Administered 2012-02-24 – 2012-03-06 (×12): 40 mg via SUBCUTANEOUS
  Filled 2012-02-24 (×16): qty 0.4

## 2012-02-24 MED ORDER — METOPROLOL TARTRATE 1 MG/ML IV SOLN
5.0000 mg | Freq: Four times a day (QID) | INTRAVENOUS | Status: DC
  Administered 2012-02-24 – 2012-02-28 (×17): 5 mg via INTRAVENOUS
  Filled 2012-02-24 (×18): qty 5
  Filled 2012-02-24: qty 10
  Filled 2012-02-24 (×2): qty 5

## 2012-02-24 MED ORDER — ALBUTEROL SULFATE (5 MG/ML) 0.5% IN NEBU: 2.5000 mg | INHALATION_SOLUTION | RESPIRATORY_TRACT | Status: DC | PRN

## 2012-02-24 NOTE — Progress Notes (Signed)
1 Day Post-Op Procedure(s) (LRB): GASTRECTOMY (N/A) JEJUNOSTOMY (Left) Subjective: C/o tenderness at incision Denies nausea  Objective: Vital signs in last 24 hours: Temp:  [97.6 F (36.4 C)-98.9 F (37.2 C)] 98.9 F (37.2 C) (10/19 0800) Pulse Rate:  [69-110] 98  (10/19 0800) Cardiac Rhythm:  [-] Normal sinus rhythm (10/19 0800) Resp:  [14-23] 20  (10/19 0800) BP: (122-164)/(40-86) 142/66 mmHg (10/19 0800) SpO2:  [26 %-100 %] 96 % (10/19 0800) Arterial Line BP: (87-188)/(59-85) 174/66 mmHg (10/19 0700) FiO2 (%):  [40 %] 40 % (10/18 1645) Weight:  [176 lb 5.9 oz (80 kg)] 176 lb 5.9 oz (80 kg) (10/18 1345)  Hemodynamic parameters for last 24 hours: CVP:  [11 mmHg-27 mmHg] 12 mmHg  Intake/Output from previous day: 10/18 0701 - 10/19 0700 In: 8662.8 [I.V.:6652.8; Blood:700; IV Piggyback:1250] Out: 1642 [Urine:1542; Blood:100] Intake/Output this shift: Total I/O In: 135.5 [I.V.:135.5] Out: 250 [Urine:250]  General appearance: alert and no distress Neurologic: intact Heart: regular rate and rhythm Lungs: diminished breath sounds bibasilar Abdomen: dressing clean and dry, distended, tender to touch  Lab Results:  Basename 02/24/12 0402 02/23/12 1430  WBC 9.2 10.0  HGB 9.0* 9.5*  HCT 27.0* 28.1*  PLT 154 145*   BMET:  Basename 02/24/12 0402 02/23/12 1430  NA 135 137  K 4.1 4.2  CL 102 103  CO2 25 26  GLUCOSE 150* 131*  BUN 7 8  CREATININE 0.63 0.70  CALCIUM 7.4* 7.7*    PT/INR:  Basename 02/22/12 1215  LABPROT 14.4  INR 1.14   ABG    Component Value Date/Time   PHART 7.419 02/24/2012 0444   HCO3 25.6* 02/24/2012 0444   TCO2 27 02/24/2012 0444   O2SAT 97.0 02/24/2012 0444   CBG (last 3)  No results found for this basename: GLUCAP:3 in the last 72 hours  Assessment/Plan: S/P Procedure(s) (LRB): GASTRECTOMY (N/A) JEJUNOSTOMY (Left) POD # 1 Esophagogastrectomy Hypertension- add IV lopressor, continue IV NTG PRN Pulmonary hygiene Mobilize SCD and  lovenox for DVT prophylaxis D/C A line   LOS: 9 days    HENDRICKSON,STEVEN C 02/24/2012

## 2012-02-24 NOTE — Progress Notes (Signed)
1 Day Post-Op  Subjective: Doing OK post op day 1 from esophagogastrectomy.  Hitting PCA.    Objective: Vital signs in last 24 hours: Temp:  [97.6 F (36.4 C)-98.9 F (37.2 C)] 98.9 F (37.2 C) (10/19 0720) Pulse Rate:  [69-110] 91  (10/19 0700) Resp:  [14-23] 21  (10/19 0700) BP: (122-164)/(40-86) 147/71 mmHg (10/19 0700) SpO2:  [26 %-100 %] 98 % (10/19 0700) Arterial Line BP: (87-188)/(59-85) 174/66 mmHg (10/19 0700) FiO2 (%):  [40 %] 40 % (10/18 1645) Weight:  [176 lb 5.9 oz (80 kg)] 176 lb 5.9 oz (80 kg) (10/18 1345) Last BM Date: 02/22/12  Intake/Output from previous day: 10/18 0701 - 10/19 0700 In: 8537.8 [I.V.:6527.8; Blood:700; IV Piggyback:1250] Out: 1642 [Urine:1542; Blood:100] Intake/Output this shift:    General appearance: alert, cooperative and no distress Resp: clear to auscultation bilaterally Cardio: regular rate and rhythm GI: soft, approp tender, moderately distended. Extremities: extremities normal, atraumatic, no cyanosis or edema  Lab Results:   Basename 02/24/12 0402 02/23/12 1430  WBC 9.2 10.0  HGB 9.0* 9.5*  HCT 27.0* 28.1*  PLT 154 145*   BMET  Basename 02/24/12 0402 02/23/12 1430  NA 135 137  K 4.1 4.2  CL 102 103  CO2 25 26  GLUCOSE 150* 131*  BUN 7 8  CREATININE 0.63 0.70  CALCIUM 7.4* 7.7*   PT/INR  Basename 02/22/12 1215  LABPROT 14.4  INR 1.14   ABG  Basename 02/24/12 0444 02/23/12 1624  PHART 7.419 7.420  HCO3 25.6* 28.5*    Studies/Results: Dg Chest 2 View  02/23/2012  *RADIOLOGY REPORT*  Clinical Data: Preop.  CHEST - 2 VIEW  Comparison: 02/18/2012.  Findings: Heart size normal.  Peribronchovascular nodularity seen on recent cross-sectional imaging is not well appreciated. A small nodular density is seen along the minor fissure, as on 02/20/2012. Lungs appear hyperinflated.  No pleural fluid.  IMPRESSION: Scattered peribronchovascular nodularity seen on recent cross- sectional imaging is not well appreciated.  No  acute findings.   Original Report Authenticated By: Reyes Ivan, M.D.    Dg Chest Portable 1 View  02/23/2012  *RADIOLOGY REPORT*  Clinical Data: Postoperative and esophagogastrectomy.  PORTABLE CHEST - 1 VIEW  Comparison: 02/23/2012 at 0527 hours.  Findings: An endotracheal tube is now in place.  The tip of this tube is 2 cm above the carina. Right central intravenous line has been inserted through the right jugular vein.  The tip of the catheter is at the level of the carina. The lungs are clear.  No effusions or pneumothoraces.  Previous median sternotomy.  The heart and mediastinal structures appear normal.  There are no acute bony changes.  IMPRESSION: No acute findings.  Endotracheal tube and right IJ catheter in position as described.   Original Report Authenticated By: Mervin Hack, M.D.     Anti-infectives: Anti-infectives     Start     Dose/Rate Route Frequency Ordered Stop   02/23/12 1430   cefOXitin (MEFOXIN) 1 g in dextrose 5 % 50 mL IVPB        1 g 100 mL/hr over 30 Minutes Intravenous 4 times per day 02/23/12 1334 02/24/12 0644   02/23/12 1315   cefOXitin (MEFOXIN) 2 g in dextrose 5 % 50 mL IVPB        2 g 100 mL/hr over 30 Minutes Intravenous To Surgery 02/23/12 1305 02/23/12 1428   02/23/12 0600   cefOXitin (MEFOXIN) 2 g in dextrose 5 % 50 mL IVPB  2 g 100 mL/hr over 30 Minutes Intravenous  Once 02/22/12 0904 02/23/12 0737          Assessment/Plan: s/p Procedure(s) (LRB) with comments: GASTRECTOMY (N/A) - esophogastrectomy, proximal third stomach and distal esophagus JEJUNOSTOMY (Left) NPO Out of bed, pulmonary toilet. Nitro gtt for HTN Protonix for GI prophylaxis.   Start lovenox today if OK with CT surg   LOS: 9 days    [name] 02/24/2012

## 2012-02-24 NOTE — Progress Notes (Signed)
Up in chair Stable day BP 160/76  Pulse 102  Temp 99 F (37.2 C) (Oral)  Resp 20  Ht 5\' 7"  (1.702 m)  Wt 176 lb 5.9 oz (80 kg)  BMI 27.62 kg/m2  SpO2 94%   Intake/Output Summary (Last 24 hours) at 02/24/12 1752 Last data filed at 02/24/12 1500  Gross per 24 hour  Intake 3136.16 ml  Output   2760 ml  Net 376.16 ml   No PM labs  Will probably start trickle TF tomorrow

## 2012-02-25 ENCOUNTER — Inpatient Hospital Stay (HOSPITAL_COMMUNITY): Payer: Medicare HMO

## 2012-02-25 LAB — CBC
HCT: 29.8 % — ABNORMAL LOW (ref 39.0–52.0)
Hemoglobin: 9.9 g/dL — ABNORMAL LOW (ref 13.0–17.0)
MCV: 86.9 fL (ref 78.0–100.0)
RBC: 3.43 MIL/uL — ABNORMAL LOW (ref 4.22–5.81)
WBC: 11.2 10*3/uL — ABNORMAL HIGH (ref 4.0–10.5)

## 2012-02-25 LAB — BASIC METABOLIC PANEL
CO2: 27 mEq/L (ref 19–32)
Chloride: 101 mEq/L (ref 96–112)
Potassium: 4.3 mEq/L (ref 3.5–5.1)
Sodium: 134 mEq/L — ABNORMAL LOW (ref 135–145)

## 2012-02-25 MED ORDER — ALPRAZOLAM 0.25 MG PO TABS
0.2500 mg | ORAL_TABLET | Freq: Three times a day (TID) | ORAL | Status: DC | PRN
  Filled 2012-02-25: qty 1

## 2012-02-25 MED ORDER — HALOPERIDOL LACTATE 5 MG/ML IJ SOLN: 2.0000 mg | Freq: Four times a day (QID) | INTRAMUSCULAR | Status: DC | PRN

## 2012-02-25 MED ORDER — SODIUM CHLORIDE 0.9 % IJ SOLN
10.0000 mL | INTRAMUSCULAR | Status: DC | PRN
  Administered 2012-03-02 – 2012-03-07 (×5): 10 mL

## 2012-02-25 MED ORDER — SODIUM CHLORIDE 0.9 % IJ SOLN
10.0000 mL | Freq: Two times a day (BID) | INTRAMUSCULAR | Status: DC
  Administered 2012-02-25: 10 mL
  Administered 2012-02-26: 20 mL
  Administered 2012-02-28: 10 mL

## 2012-02-25 NOTE — Progress Notes (Signed)
Resting quietly  BP 149/80  Pulse 88  Temp 98.8 F (37.1 C) (Oral)  Resp 19  Ht 5\' 7"  (1.702 m)  Wt 176 lb 5.9 oz (80 kg)  BMI 27.62 kg/m2  SpO2 100%   Intake/Output Summary (Last 24 hours) at 02/25/12 1949 Last data filed at 02/25/12 1900  Gross per 24 hour  Intake   2149 ml  Output   3545 ml  Net  -1396 ml    KUB shows stacked SB loops, some air in colon, possible ileus v partial SBO  Hold off on TF for now, consider TNA if not improving soon

## 2012-02-25 NOTE — Progress Notes (Signed)
2 Days Post-Op Procedure(s) (LRB): GASTRECTOMY (N/A) JEJUNOSTOMY (Left) Subjective: Confused last night- pulled out central line C/o incisional pain  Objective: Vital signs in last 24 hours: Temp:  [98.3 F (36.8 C)-100.2 F (37.9 C)] 98.9 F (37.2 C) (10/20 0729) Pulse Rate:  [78-112] 87  (10/20 0700) Cardiac Rhythm:  [-] Normal sinus rhythm (10/20 0030) Resp:  [17-23] 17  (10/20 0700) BP: (133-166)/(67-103) 153/81 mmHg (10/20 0700) SpO2:  [91 %-100 %] 95 % (10/20 0700)  Hemodynamic parameters for last 24 hours:    Intake/Output from previous day: 10/19 0701 - 10/20 0700 In: 2315.1 [I.V.:2175.1; IV Piggyback:110] Out: 3570 [Urine:3570] Intake/Output this shift:    General appearance: alert, cooperative and anxious Neurologic: intact Heart: regular rate and rhythm Lungs: diminished breath sounds bibasilar Abdomen: distended, minimal BS, tender to palpation  Lab Results:  Basename 02/25/12 0455 02/24/12 0402  WBC 11.2* 9.2  HGB 9.9* 9.0*  HCT 29.8* 27.0*  PLT 188 154   BMET:  Basename 02/25/12 0455 02/24/12 0402  NA 134* 135  K 4.3 4.1  CL 101 102  CO2 27 25  GLUCOSE 118* 150*  BUN 6 7  CREATININE 0.63 0.63  CALCIUM 7.8* 7.4*    PT/INR:  Basename 02/22/12 1215  LABPROT 14.4  INR 1.14   ABG    Component Value Date/Time   PHART 7.419 02/24/2012 0444   HCO3 25.6* 02/24/2012 0444   TCO2 27 02/24/2012 0444   O2SAT 97.0 02/24/2012 0444   CBG (last 3)  No results found for this basename: GLUCAP:3 in the last 72 hours  Assessment/Plan: S/P Procedure(s) (LRB): GASTRECTOMY (N/A) JEJUNOSTOMY (Left) POD # 2 esophagogastrectomy Confusion last night, ? Sundowning, he's cooperative this AM, but still anxious Pulled central line, needs central access- will have PICC placed Will delay starting TF given abdominal distention Will keep foley one more day to monitor UO Ambulate SCD + lovenox for DVT prophylaxis   LOS: 10 days    HENDRICKSON,STEVEN  C 02/25/2012

## 2012-02-25 NOTE — Progress Notes (Signed)
GS Progress Note Subjective: The patient pulled out his central line overnight.  Intermittently confused.  Objective: Vital signs in last 24 hours: Temp:  [98.3 F (36.8 C)-100.2 F (37.9 C)] 98.9 F (37.2 C) (10/20 0800) Pulse Rate:  [78-112] 91  (10/20 0800) Resp:  [17-23] 19  (10/20 0800) BP: (140-166)/(67-103) 154/72 mmHg (10/20 0800) SpO2:  [92 %-100 %] 96 % (10/20 0800) Last BM Date: 02/22/12  Intake/Output from previous day: 10/19 0701 - 10/20 0700 In: 2415.1 [I.V.:2275.1; IV Piggyback:110] Out: 3570 [Urine:3570] Intake/Output this shift: Total I/O In: 200 [I.V.:200] Out: -   Lungs: clear  Abd: Distended, good bowel sounds.  Extremities: No DVT signs or symptoms.  Neuro: Does not appear to be confused or disoriented currently  Lab Results: CBC   Basename 02/25/12 0455 02/24/12 0402  WBC 11.2* 9.2  HGB 9.9* 9.0*  HCT 29.8* 27.0*  PLT 188 154   BMET  Basename 02/25/12 0455 02/24/12 0402  NA 134* 135  K 4.3 4.1  CL 101 102  CO2 27 25  GLUCOSE 118* 150*  BUN 6 7  CREATININE 0.63 0.63  CALCIUM 7.8* 7.4*   PT/INR  Basename 02/22/12 1215  LABPROT 14.4  INR 1.14   ABG  Basename 02/24/12 0444 02/23/12 1624  PHART 7.419 7.420  HCO3 25.6* 28.5*    Studies/Results: Dg Chest Port 1 View  02/24/2012  *RADIOLOGY REPORT*  Clinical Data: Status post esophagectomy.  PORTABLE CHEST - 1 VIEW  Comparison: Plain film of the chest 02/23/2012.  Findings: The patient has been extubated.  Right IJ catheter remains in place.  There is some left basilar subsegmental atelectasis.  Right lung is clear.  No pneumothorax.  Cardiomegaly noted.  The patient is status post aortic valve replacement.  IMPRESSION:  1.  Status post extubation. 2.  Subsegmental atelectasis left lung base.   Original Report Authenticated By: Bernadene Bell. D'ALESSIO, M.D.    Dg Chest Portable 1 View  02/23/2012  *RADIOLOGY REPORT*  Clinical Data: Postoperative and esophagogastrectomy.  PORTABLE  CHEST - 1 VIEW  Comparison: 02/23/2012 at 0527 hours.  Findings: An endotracheal tube is now in place.  The tip of this tube is 2 cm above the carina. Right central intravenous line has been inserted through the right jugular vein.  The tip of the catheter is at the level of the carina. The lungs are clear.  No effusions or pneumothoraces.  Previous median sternotomy.  The heart and mediastinal structures appear normal.  There are no acute bony changes.  IMPRESSION: No acute findings.  Endotracheal tube and right IJ catheter in position as described.   Original Report Authenticated By: Mervin Hack, M.D.     Anti-infectives: Anti-infectives     Start     Dose/Rate Route Frequency Ordered Stop   02/23/12 1430   cefOXitin (MEFOXIN) 1 g in dextrose 5 % 50 mL IVPB        1 g 100 mL/hr over 30 Minutes Intravenous 4 times per day 02/23/12 1334 02/24/12 0644   02/23/12 1315   cefOXitin (MEFOXIN) 2 g in dextrose 5 % 50 mL IVPB        2 g 100 mL/hr over 30 Minutes Intravenous To Surgery 02/23/12 1305 02/23/12 1428   02/23/12 0600   cefOXitin (MEFOXIN) 2 g in dextrose 5 % 50 mL IVPB        2 g 100 mL/hr over 30 Minutes Intravenous  Once 02/22/12 0904 02/23/12 0737  Assessment/Plan: s/p Procedure(s): GASTRECTOMY JEJUNOSTOMY Continue foley due to diuresing patient and strict I&O Will review Op report to see if he has actual jejunostomy feeding tube Get flat plate KUB   LOS: 10 days    Marta Lamas. Gae Bon, MD, FACS [phone] [facility] 02/25/2012

## 2012-02-25 NOTE — Progress Notes (Signed)
Peripherally Inserted Central Catheter/Midline Placement  The IV Nurse has discussed with the patient and/or persons authorized to consent for the patient, the purpose of this procedure and the potential benefits and risks involved with this procedure.  The benefits include less needle sticks, lab draws from the catheter and patient may be discharged home with the catheter.  Risks include, but not limited to, infection, bleeding, blood clot (thrombus formation), and puncture of an artery; nerve damage and irregular heat beat.  Alternatives to this procedure were also discussed.  PICC/Midline Placement Documentation        [name] 02/25/2012, 11:21 AM Consent obtained from wife at bedside

## 2012-02-26 ENCOUNTER — Inpatient Hospital Stay (HOSPITAL_COMMUNITY): Payer: Medicare HMO

## 2012-02-26 LAB — CBC WITH DIFFERENTIAL/PLATELET
Eosinophils Absolute: 0.9 10*3/uL — ABNORMAL HIGH (ref 0.0–0.7)
Hemoglobin: 9.1 g/dL — ABNORMAL LOW (ref 13.0–17.0)
Lymphocytes Relative: 11 % — ABNORMAL LOW (ref 12–46)
Lymphs Abs: 1 10*3/uL (ref 0.7–4.0)
MCH: 29 pg (ref 26.0–34.0)
Monocytes Relative: 9 % (ref 3–12)
Neutro Abs: 5.9 10*3/uL (ref 1.7–7.7)
Neutrophils Relative %: 69 % (ref 43–77)
Platelets: 185 10*3/uL (ref 150–400)
RBC: 3.14 MIL/uL — ABNORMAL LOW (ref 4.22–5.81)
WBC: 8.5 10*3/uL (ref 4.0–10.5)

## 2012-02-26 LAB — POCT I-STAT 4, (NA,K, GLUC, HGB,HCT)
Glucose, Bld: 126 mg/dL — ABNORMAL HIGH (ref 70–99)
HCT: 24 % — ABNORMAL LOW (ref 39.0–52.0)
HCT: 24 % — ABNORMAL LOW (ref 39.0–52.0)
Hemoglobin: 8.2 g/dL — ABNORMAL LOW (ref 13.0–17.0)
Hemoglobin: 8.2 g/dL — ABNORMAL LOW (ref 13.0–17.0)
Potassium: 4.7 mEq/L (ref 3.5–5.1)
Sodium: 137 mEq/L (ref 135–145)

## 2012-02-26 LAB — POCT I-STAT 7, (LYTES, BLD GAS, ICA,H+H)
Bicarbonate: 28.6 mEq/L — ABNORMAL HIGH (ref 20.0–24.0)
Calcium, Ion: 1.12 mmol/L — ABNORMAL LOW (ref 1.13–1.30)
Calcium, Ion: 1.11 mmol/L — ABNORMAL LOW (ref 1.13–1.30)
HCT: 20 % — ABNORMAL LOW (ref 39.0–52.0)
Hemoglobin: 6.8 g/dL — CL (ref 13.0–17.0)
O2 Saturation: 100 %
Patient temperature: 35.9
Patient temperature: 35.5
Potassium: 4.5 mEq/L (ref 3.5–5.1)
Sodium: 137 mEq/L (ref 135–145)
pCO2 arterial: 44.5 mmHg (ref 35.0–45.0)
pCO2 arterial: 40 mmHg (ref 35.0–45.0)
pH, Arterial: 7.447 (ref 7.350–7.450)
pO2, Arterial: 283 mmHg — ABNORMAL HIGH (ref 80.0–100.0)

## 2012-02-26 LAB — BASIC METABOLIC PANEL
BUN: 7 mg/dL (ref 6–23)
CO2: 28 mEq/L (ref 19–32)
Chloride: 101 mEq/L (ref 96–112)
Glucose, Bld: 113 mg/dL — ABNORMAL HIGH (ref 70–99)
Potassium: 4 mEq/L (ref 3.5–5.1)
Sodium: 134 mEq/L — ABNORMAL LOW (ref 135–145)

## 2012-02-26 MED ORDER — ZINC TRACE METAL 1 MG/ML IV SOLN
INTRAVENOUS | Status: AC
  Administered 2012-02-26: 17:00:00 via INTRAVENOUS
  Filled 2012-02-26: qty 1000

## 2012-02-26 MED ORDER — FAT EMULSION 20 % IV EMUL
250.0000 mL | INTRAVENOUS | Status: AC
  Administered 2012-02-26: 250 mL via INTRAVENOUS
  Filled 2012-02-26: qty 250

## 2012-02-26 NOTE — Progress Notes (Signed)
Nutrition Follow-up  Intervention:    TPN per pharmacy  As medically appropriate, recommend initiation of Vital AF 1.2 formula via J-tube at 20 ml/hr and increase 10 ml every 6 hours to goal rate of 70 ml/hr to provide 2016 kcals, 126 gm protein, 1362 ml of free water RD to follow for nutrition care plan  Assessment:   Patient extubated 10/18 PM. EN initiation via J-tube delayed at this time due to abdominal distention. KUB 10/20 impression: possible low grade partial obstruction or ileus.  Patient is receiving TPN with Clinimix E 5/15 @ 42 ml/hr.  Lipids (20% IVFE @ 10.4 ml/hr), multivitamins, and trace elements are provided 3 times weekly (MWF) due to national backorder.  Provides 930 kcal and 50 grams protein daily (based on weekly average).  Meets 46% minimum estimated kcal and 48% minimum estimated protein needs  Diet Order:  NPO  Meds: Scheduled Meds:   . enoxaparin  40 mg Subcutaneous Q24H  . fentaNYL   Intravenous Q4H  . metoprolol  5 mg Intravenous Q6H  . pantoprazole (PROTONIX) IV  40 mg Intravenous Q12H  . prednisoLONE acetate  2 drop Right Eye QHS  . sodium chloride  10 mL Intravenous Q12H  . sodium chloride  10-40 mL Intracatheter Q12H   Continuous Infusions:   . dextrose 5 % and 0.45 % NaCl with KCl 20 mEq/L 75 mL/hr at 02/26/12 0700  . TPN (CLINIMIX) +/- additives     And  . fat emulsion    . DISCONTD: nitroGLYCERIN Stopped (02/24/12 0630)   PRN Meds:.albuterol, ALPRAZolam, diphenhydrAMINE, diphenhydrAMINE, haloperidol lactate, midazolam, naloxone, ondansetron (ZOFRAN) IV, ondansetron (ZOFRAN) IV, ondansetron (ZOFRAN) IV, potassium chloride, sodium chloride, sodium chloride  Labs:  CMP     Component Value Date/Time   NA 134* 02/26/2012 0414   K 4.0 02/26/2012 0414   CL 101 02/26/2012 0414   CO2 28 02/26/2012 0414   GLUCOSE 113* 02/26/2012 0414   BUN 7 02/26/2012 0414   CREATININE 0.59 02/26/2012 0414   CALCIUM 8.0* 02/26/2012 0414   PROT 5.4* 02/22/2012  1215   ALBUMIN 2.4* 02/22/2012 1215   AST 13 02/22/2012 1215   ALT 10 02/22/2012 1215   ALKPHOS 43 02/22/2012 1215   BILITOT 0.4 02/22/2012 1215   GFRNONAA >90 02/26/2012 0414   GFRAA >90 02/26/2012 0414     Intake/Output Summary (Last 24 hours) at 02/26/12 1201 Last data filed at 02/26/12 1100  Gross per 24 hour  Intake 2131.29 ml  Output   1175 ml  Net 956.29 ml    Weight Status:  78 kg (10/21) -- down 5 kg from 10/18  Re-estimated needs:  2000-2200 kcals, 115-125 gm protein  Nutrition Dx:  Inadequate Oral Intake r/t inability to eat, s/p esophagogastrostomy as evidenced by NPO status  New Goal:  TPN to meet >90% of estimated protein needs, maximize energy provision as able during national lipid backorder, currently unmet  Monitor:  EN initiation, TPN prescription, weight, labs, I/O's  Kirkland Hun, RD, LDN Pager #: [phone] After-Hours Pager #: [phone]

## 2012-02-26 NOTE — Progress Notes (Signed)
Patient ID: [name], male   DOB: 09/11/1932, 78 y.o.   MRN: [id number]                   [address]            [place] [address]          [phone]     3 Days Post-Op Procedure(s) (LRB): GASTRECTOMY (N/A) JEJUNOSTOMY (Left)  Total Length of Stay:  LOS: 11 days  BP 156/91  Pulse 92  Temp 99.3 F (37.4 C) (Oral)  Resp 21  Ht 5\' 7"  (1.702 m)  Wt 171 lb 15.3 oz (78 kg)  BMI 26.93 kg/m2  SpO2 93%  .Intake/Output      10/20 0701 - 10/21 0700 10/21 0701 - 10/22 0700   I.V. (mL/kg) 1804 (23.1) 585.8 (7.5)   Other 310    IV Piggyback 20 10   Total Intake(mL/kg) 2134 (27.4) 595.8 (7.6)   Urine (mL/kg/hr) 2350 (1.3) 700 (0.8)   Drains  30   Total Output 2350 730   Net -216 -134.2             . dextrose 5 % and 0.45 % NaCl with KCl 20 mEq/L 50 mL/hr at 02/26/12 1200  . TPN (CLINIMIX) +/- additives 42 mL/hr at 02/26/12 1721   And  . fat emulsion 250 mL (02/26/12 1721)  . DISCONTD: nitroGLYCERIN Stopped (02/24/12 0630)   Dg Abd Portable 1v  02/26/2012  *RADIOLOGY REPORT*  Clinical Data: Ileus versus small bowel obstruction  PORTABLE ABDOMEN - 1 VIEW  Comparison: 02/25/2012  Findings: Air filled loops of transverse colon.  Nondilated loops of small bowel in the left mid abdomen, improved.  While this may reflect improving adynamic ileus, it is certainly nonobstructive.  No evidence of free air under the diaphragm on the upright view.  Surgical drain in the right mid abdomen.  Overlying skin staples.  IMPRESSION: No evidence of small bowel obstruction or free air.  Possible improving adynamic ileus.   Original Report Authenticated By: Charline Bills, M.D.    Dg Abd Portable 1v  02/25/2012  *RADIOLOGY REPORT*  Clinical Data: Postop abdominal distention  PORTABLE ABDOMEN - 1 VIEW  Comparison:   02/14/2012  Findings: Midline skin staples noted.  There is a left lower quadrant jejunostomy tube which crosses the midline with tip in the right lower  quadrant.  Multiple air-filled loops of small bowel are identified within the left lower quadrant of the abdomen.  These measure up to 2.8 cm.  Gas and stool noted within the colon up to the rectum.  IMPRESSION:  1.  Nonspecific bowel gas pattern. 2.  Air-filled loops of small bowel and may represent low grade partial obstruction or ileus.   Original Report Authenticated By: Rosealee Albee, M.D.     Lab Results  Component Value Date   WBC 8.5 02/26/2012   HGB 9.1* 02/26/2012   HCT 27.3* 02/26/2012   PLT 185 02/26/2012   GLUCOSE 113* 02/26/2012   ALT 10 02/22/2012   AST 13 02/22/2012   NA 134* 02/26/2012   K 4.0 02/26/2012   CL 101 02/26/2012   CREATININE 0.59 02/26/2012   BUN 7 02/26/2012   CO2 28 02/26/2012   INR 1.14 02/22/2012   Walked 600 feet  Abdomen quiet on exam No bm yes Wounds ok  Abdominal film slightly better   Delight Ovens MD  Beeper [phone] Office [phone] 02/26/2012 5:41 PM

## 2012-02-26 NOTE — Progress Notes (Signed)
Patient ID: [name], male   DOB: Nov 13, 1932, 78 y.o.   MRN: [id number] [facility] Surgery Progress Note:   3 Days Post-Op  Subjective: Mental status is clear.  He knows it is Monday and appears oriented.  Objective: Vital signs in last 24 hours: Temp:  [97.6 F (36.4 C)-99.1 F (37.3 C)] 98.6 F (37 C) (10/21 0400) Pulse Rate:  [74-104] 84  (10/21 0600) Resp:  [15-22] 22  (10/21 0600) BP: (113-170)/(54-94) 148/67 mmHg (10/21 0600) SpO2:  [92 %-100 %] 92 % (10/21 0600) Weight:  [171 lb 15.3 oz (78 kg)] 171 lb 15.3 oz (78 kg) (10/21 0500)  Intake/Output from previous day: 10/20 0701 - 10/21 0700 In: 1659 [I.V.:1579; IV Piggyback:20] Out: 2350 [Urine:2350] Intake/Output this shift:    Physical Exam: Work of breathing is not labored.  Breath sounds faint.  P 77.  Incision bland.  He reports no flatus.  No nausea or vomiting.   Lab Results:  Results for orders placed during the hospital encounter of 02/15/12 (from the past 48 hour(s))  BASIC METABOLIC PANEL     Status: Abnormal   Collection Time   02/25/12  4:55 AM      Component Value Range Comment   Sodium 134 (*) 135 - 145 mEq/L    Potassium 4.3  3.5 - 5.1 mEq/L    Chloride 101  96 - 112 mEq/L    CO2 27  19 - 32 mEq/L    Glucose, Bld 118 (*) 70 - 99 mg/dL    BUN 6  6 - 23 mg/dL    Creatinine, Ser 1.47  0.50 - 1.35 mg/dL    Calcium 7.8 (*) 8.4 - 10.5 mg/dL    GFR calc non Af Amer >90  >90 mL/min    GFR calc Af Amer >90  >90 mL/min   CBC     Status: Abnormal   Collection Time   02/25/12  4:55 AM      Component Value Range Comment   WBC 11.2 (*) 4.0 - 10.5 K/uL    RBC 3.43 (*) 4.22 - 5.81 MIL/uL    Hemoglobin 9.9 (*) 13.0 - 17.0 g/dL    HCT 82.9 (*) 56.2 - 52.0 %    MCV 86.9  78.0 - 100.0 fL    MCH 28.9  26.0 - 34.0 pg    MCHC 33.2  30.0 - 36.0 g/dL    RDW 13.0  86.5 - 78.4 %    Platelets 188  150 - 400 K/uL   CBC WITH DIFFERENTIAL     Status: Abnormal   Collection Time   02/26/12  4:14 AM   Component Value Range Comment   WBC 8.5  4.0 - 10.5 K/uL    RBC 3.14 (*) 4.22 - 5.81 MIL/uL    Hemoglobin 9.1 (*) 13.0 - 17.0 g/dL    HCT 69.6 (*) 29.5 - 52.0 %    MCV 86.9  78.0 - 100.0 fL    MCH 29.0  26.0 - 34.0 pg    MCHC 33.3  30.0 - 36.0 g/dL    RDW 28.4  13.2 - 44.0 %    Platelets 185  150 - 400 K/uL    Neutrophils Relative 69  43 - 77 %    Neutro Abs 5.9  1.7 - 7.7 K/uL    Lymphocytes Relative 11 (*) 12 - 46 %    Lymphs Abs 1.0  0.7 - 4.0 K/uL    Monocytes Relative 9  3 -  12 %    Monocytes Absolute 0.8  0.1 - 1.0 K/uL    Eosinophils Relative 11 (*) 0 - 5 %    Eosinophils Absolute 0.9 (*) 0.0 - 0.7 K/uL    Basophils Relative 0  0 - 1 %    Basophils Absolute 0.0  0.0 - 0.1 K/uL   BASIC METABOLIC PANEL     Status: Abnormal   Collection Time   02/26/12  4:14 AM      Component Value Range Comment   Sodium 134 (*) 135 - 145 mEq/L    Potassium 4.0  3.5 - 5.1 mEq/L    Chloride 101  96 - 112 mEq/L    CO2 28  19 - 32 mEq/L    Glucose, Bld 113 (*) 70 - 99 mg/dL    BUN 7  6 - 23 mg/dL    Creatinine, Ser 1.61  0.50 - 1.35 mg/dL    Calcium 8.0 (*) 8.4 - 10.5 mg/dL    GFR calc non Af Amer >90  >90 mL/min    GFR calc Af Amer >90  >90 mL/min     Radiology/Results: Dg Chest Port 1 View  02/24/2012  *RADIOLOGY REPORT*  Clinical Data: Status post esophagectomy.  PORTABLE CHEST - 1 VIEW  Comparison: Plain film of the chest 02/23/2012.  Findings: The patient has been extubated.  Right IJ catheter remains in place.  There is some left basilar subsegmental atelectasis.  Right lung is clear.  No pneumothorax.  Cardiomegaly noted.  The patient is status post aortic valve replacement.  IMPRESSION:  1.  Status post extubation. 2.  Subsegmental atelectasis left lung base.   Original Report Authenticated By: Bernadene Bell. Maricela Curet, M.D.    Dg Abd Portable 1v  02/25/2012  *RADIOLOGY REPORT*  Clinical Data: Postop abdominal distention  PORTABLE ABDOMEN - 1 VIEW  Comparison:   02/14/2012  Findings:  Midline skin staples noted.  There is a left lower quadrant jejunostomy tube which crosses the midline with tip in the right lower quadrant.  Multiple air-filled loops of small bowel are identified within the left lower quadrant of the abdomen.  These measure up to 2.8 cm.  Gas and stool noted within the colon up to the rectum.  IMPRESSION:  1.  Nonspecific bowel gas pattern. 2.  Air-filled loops of small bowel and may represent low grade partial obstruction or ileus.   Original Report Authenticated By: Rosealee Albee, M.D.     Anti-infectives: Anti-infectives     Start     Dose/Rate Route Frequency Ordered Stop   02/23/12 1430   cefOXitin (MEFOXIN) 1 g in dextrose 5 % 50 mL IVPB        1 g 100 mL/hr over 30 Minutes Intravenous 4 times per day 02/23/12 1334 02/24/12 0644   02/23/12 1315   cefOXitin (MEFOXIN) 2 g in dextrose 5 % 50 mL IVPB        2 g 100 mL/hr over 30 Minutes Intravenous To Surgery 02/23/12 1305 02/23/12 1428   02/23/12 0600   cefOXitin (MEFOXIN) 2 g in dextrose 5 % 50 mL IVPB        2 g 100 mL/hr over 30 Minutes Intravenous  Once 02/22/12 0904 02/23/12 0737          Assessment/Plan: Problem List: Patient Active Problem List  Diagnosis  . Acute upper GI bleed  . Esophageal mass  . S/P aortic valve replacement    WBC down today. Appears stable.  Foley  in place.  No flatus yet-if jejunostomy feed starts would start slowly.   Path pending  3 Days Post-Op    LOS: 11 days   Matt B. Daphine Deutscher, MD, [facility] [phone] beeper [phone]  02/26/2012 7:12 AM

## 2012-02-26 NOTE — Progress Notes (Signed)
PARENTERAL NUTRITION CONSULT NOTE - INITIAL  Pharmacy Consult for *TPN Indication: prolonged ileus  No Known Allergies  Patient Measurements: Height: 5\' 7"  (170.2 cm) Weight: 171 lb 15.3 oz (78 kg) (standing weight scale) IBW/kg (Calculated) : 66.1  Adjusted Body Weight: 70kg Usual Weight: 82kg  Vital Signs: Temp: 98.7 F (37.1 C) (10/21 0724) Temp src: Oral (10/21 0724) BP: 142/91 mmHg (10/21 0800) Pulse Rate: 88  (10/21 0800) Intake/Output from previous day: 10/20 0701 - 10/21 0700 In: 2284 [I.V.:1804; IV Piggyback:20] Out: 2350 [Urine:2350] Intake/Output from this shift: Total I/O In: 101.8 [I.V.:101.8] Out: 125 [Urine:125]  Labs:  [facility] 02/26/12 0414 02/25/12 0455 02/24/12 0402  WBC 8.5 11.2* 9.2  HGB 9.1* 9.9* 9.0*  HCT 27.3* 29.8* 27.0*  PLT 185 188 154  APTT -- -- --  INR -- -- --     Basename 02/26/12 0414 02/25/12 0455 02/24/12 0402  NA 134* 134* 135  K 4.0 4.3 4.1  CL 101 101 102  CO2 28 27 25   GLUCOSE 113* 118* 150*  BUN 7 6 7   CREATININE 0.59 0.63 0.63  LABCREA -- -- --  CREAT24HRUR -- -- --  CALCIUM 8.0* 7.8* 7.4*  MG -- -- --  PHOS -- -- --  PROT -- -- --  ALBUMIN -- -- --  AST -- -- --  ALT -- -- --  ALKPHOS -- -- --  BILITOT -- -- --  BILIDIR -- -- --  IBILI -- -- --  PREALBUMIN -- -- --  TRIG -- -- --  CHOLHDL -- -- --  CHOL -- -- --   Estimated Creatinine Clearance: 73.4 ml/min (by C-G formula based on Cr of 0.59).   No results found for this basename: GLUCAP:3 in the last 72 hours  Medical History: Past Medical History  Diagnosis Date  . Anemia   . Hypertension   . COPD (chronic obstructive pulmonary disease)     Medications:  Scheduled:    . enoxaparin  40 mg Subcutaneous Q24H  . fentaNYL   Intravenous Q4H  . metoprolol  5 mg Intravenous Q6H  . pantoprazole (PROTONIX) IV  40 mg Intravenous Q12H  . prednisoLONE acetate  2 drop Right Eye QHS  . sodium chloride  10 mL Intravenous Q12H  . sodium chloride  10-40  mL Intracatheter Q12H   Infusions:    . dextrose 5 % and 0.45 % NaCl with KCl 20 mEq/L 75 mL/hr at 02/26/12 0700  . DISCONTD: nitroGLYCERIN Stopped (02/24/12 0630)    Insulin Requirements in the past 24 hours:  zero  Current Nutrition:  NPO  Assessment: 78 y/o male patient admitted with esophageal mass, now POD#3 s/p gastrectomy and jejunostomy requiring TPN for prolonged ileus. Patient at low risk for refeeding and should tolerate parental nutrition without difficulty.  Nutritional Goals:  1650-1750 kCal, 105-115 grams of protein per day  Plan:  Begin Clinimix E 5/15 at 52ml/hr, which is approximately 1/2 of goal. Goal rate of 53ml/hr will provide 1747kcal and 108 protein daily based upon 3 times weekly lipid supplementation. Monitor cbg q8h and I/O status with daily tpn labs. Lipid and MVI supplements MWF d/t national backorder. Will also decrease maintenance IVF to 41ml/hr.  Verlene Mayer, PharmD, BCPS Pager [phone] 02/26/2012,10:49 AM

## 2012-02-26 NOTE — Progress Notes (Addendum)
TCTS DAILY PROGRESS NOTE                   [address]            [name] [address]          [phone]      3 Days Post-Op Procedure(s) (LRB): GASTRECTOMY (N/A) JEJUNOSTOMY (Left)  Total Length of Stay:  LOS: 11 days   Subjective: Feels pretty well, no flatus, good pain control, doing some walkjing  Objective: Vital signs in last 24 hours: Temp:  [97.6 F (36.4 C)-99.1 F (37.3 C)] 98.7 F (37.1 C) (10/21 0724) Pulse Rate:  [74-104] 84  (10/21 0600) Cardiac Rhythm:  [-] Normal sinus rhythm (10/21 0600) Resp:  [15-22] 22  (10/21 0600) BP: (113-170)/(54-94) 148/67 mmHg (10/21 0600) SpO2:  [92 %-100 %] 92 % (10/21 0600) Weight:  [171 lb 15.3 oz (78 kg)] 171 lb 15.3 oz (78 kg) (10/21 0500)  Filed Weights   02/15/12 2000 02/23/12 1345 02/26/12 0500  Weight: 177 lb 11.1 oz (80.6 kg) 176 lb 5.9 oz (80 kg) 171 lb 15.3 oz (78 kg)    Weight change:    Hemodynamic parameters for last 24 hours:    Intake/Output from previous day: 10/20 0701 - 10/21 0700 In: 2284 [I.V.:1804; IV Piggyback:20] Out: 2350 [Urine:2350]  Intake/Output this shift:    Current Meds: Scheduled Meds:   . enoxaparin  40 mg Subcutaneous Q24H  . fentaNYL   Intravenous Q4H  . metoprolol  5 mg Intravenous Q6H  . pantoprazole (PROTONIX) IV  40 mg Intravenous Q12H  . prednisoLONE acetate  2 drop Right Eye QHS  . sodium chloride  10 mL Intravenous Q12H  . sodium chloride  10-40 mL Intracatheter Q12H   Continuous Infusions:   . dextrose 5 % and 0.45 % NaCl with KCl 20 mEq/L 75 mL/hr at 02/26/12 0700  . nitroGLYCERIN Stopped (02/24/12 0630)   PRN Meds:.albuterol, ALPRAZolam, diphenhydrAMINE, diphenhydrAMINE, haloperidol lactate, midazolam, naloxone, ondansetron (ZOFRAN) IV, ondansetron (ZOFRAN) IV, ondansetron (ZOFRAN) IV, potassium chloride, sodium chloride, sodium chloride  General appearance: alert, appears stated age and no distress Heart: regular rate and rhythm Lungs:  mild dim in bases Abdomen: quiet, mod distension, mild ttp Extremities: no edema Wound: dressings CDI  Lab Results: CBC: Basename 02/26/12 0414 02/25/12 0455  WBC 8.5 11.2*  HGB 9.1* 9.9*  HCT 27.3* 29.8*  PLT 185 188   BMET:  Basename 02/26/12 0414 02/25/12 0455  NA 134* 134*  K 4.0 4.3  CL 101 101  CO2 28 27  GLUCOSE 113* 118*  BUN 7 6  CREATININE 0.59 0.63  CALCIUM 8.0* 7.8*    PT/INR: No results found for this basename: LABPROT,INR in the last 72 hours Radiology: Dg Chest Port 1 View  02/24/2012  *RADIOLOGY REPORT*  Clinical Data: Status post esophagectomy.  PORTABLE CHEST - 1 VIEW  Comparison: Plain film of the chest 02/23/2012.  Findings: The patient has been extubated.  Right IJ catheter remains in place.  There is some left basilar subsegmental atelectasis.  Right lung is clear.  No pneumothorax.  Cardiomegaly noted.  The patient is status post aortic valve replacement.  IMPRESSION:  1.  Status post extubation. 2.  Subsegmental atelectasis left lung base.   Original Report Authenticated By: Bernadene Bell. Maricela Curet, M.D.    Dg Abd Portable 1v  02/25/2012  *RADIOLOGY REPORT*  Clinical Data: Postop abdominal distention  PORTABLE ABDOMEN - 1 VIEW  Comparison:   02/14/2012  Findings: Midline skin staples noted.  There is a left lower quadrant jejunostomy tube which crosses the midline with tip in the right lower quadrant.  Multiple air-filled loops of small bowel are identified within the left lower quadrant of the abdomen.  These measure up to 2.8 cm.  Gas and stool noted within the colon up to the rectum.  IMPRESSION:  1.  Nonspecific bowel gas pattern. 2.  Air-filled loops of small bowel and may represent low grade partial obstruction or ileus.   Original Report Authenticated By: Rosealee Albee, M.D.      Assessment/Plan: S/P Procedure(s) (LRB): GASTRECTOMY (N/A) JEJUNOSTOMY (Left)   1 Doing very well 2 ileus, cont npo, start  TNA/poss low rate J tube feeds, limit narcs,  increase ambulation as able, recheck AXR 3 labs stable, monitor H/H closely, WBC in normal range 4 pulm toilet 5 path pending     GOLD,[name] 02/26/2012 7:53 AM  Still with ileus pattern so will wait on tube feeding, TNA until can start tube feeding I have seen and examined [name] and agree with the above assessment  and plan.  Delight Ovens MD Beeper [phone] Office [phone] 02/26/2012 8:35 AM

## 2012-02-27 ENCOUNTER — Encounter (HOSPITAL_COMMUNITY): Payer: Self-pay | Admitting: Cardiothoracic Surgery

## 2012-02-27 LAB — COMPREHENSIVE METABOLIC PANEL
ALT: 27 U/L (ref 0–53)
AST: 12 U/L (ref 0–37)
Albumin: 2 g/dL — ABNORMAL LOW (ref 3.5–5.2)
Alkaline Phosphatase: 38 U/L — ABNORMAL LOW (ref 39–117)
BUN: 7 mg/dL (ref 6–23)
CO2: 29 mEq/L (ref 19–32)
Calcium: 7.9 mg/dL — ABNORMAL LOW (ref 8.4–10.5)
Chloride: 100 mEq/L (ref 96–112)
Creatinine, Ser: 0.56 mg/dL (ref 0.50–1.35)
GFR calc Af Amer: >90 mL/min (ref >90)
GFR calc non Af Amer: >90 mL/min (ref >90)
Glucose, Bld: 127 mg/dL — ABNORMAL HIGH (ref 70–99)
Potassium: 3.7 mEq/L (ref 3.5–5.1)
Sodium: 136 mEq/L (ref 135–145)
Total Bilirubin: 0.4 mg/dL (ref 0.3–1.2)
Total Protein: 4.9 g/dL — ABNORMAL LOW (ref 6.0–8.3)

## 2012-02-27 LAB — CBC
HCT: 26 % — ABNORMAL LOW (ref 39.0–52.0)
Hemoglobin: 8.5 g/dL — ABNORMAL LOW (ref 13.0–17.0)
MCH: 28.1 pg (ref 26.0–34.0)
MCHC: 32.7 g/dL (ref 30.0–36.0)
MCV: 86.1 fL (ref 78.0–100.0)
Platelets: 216 10*3/uL (ref 150–400)
RBC: 3.02 MIL/uL — ABNORMAL LOW (ref 4.22–5.81)
RDW: 13.9 % (ref 11.5–15.5)
WBC: 8.3 10*3/uL (ref 4.0–10.5)

## 2012-02-27 LAB — DIFFERENTIAL
Basophils Absolute: 0 10*3/uL (ref 0.0–0.1)
Basophils Relative: 0 % (ref 0–1)
Eosinophils Absolute: 0.7 10*3/uL (ref 0.0–0.7)
Eosinophils Relative: 8 % — ABNORMAL HIGH (ref 0–5)
Lymphocytes Relative: 8 % — ABNORMAL LOW (ref 12–46)
Lymphs Abs: 0.7 10*3/uL (ref 0.7–4.0)
Monocytes Absolute: 0.9 10*3/uL (ref 0.1–1.0)
Monocytes Relative: 11 % (ref 3–12)
Neutro Abs: 6 10*3/uL (ref 1.7–7.7)
Neutrophils Relative %: 73 % (ref 43–77)

## 2012-02-27 LAB — GLUCOSE, CAPILLARY
Glucose-Capillary: 123 mg/dL — ABNORMAL HIGH (ref 70–99)
Glucose-Capillary: 134 mg/dL — ABNORMAL HIGH (ref 70–99)

## 2012-02-27 LAB — PREALBUMIN: Prealbumin: 8.8 mg/dL — ABNORMAL LOW (ref 17.0–34.0)

## 2012-02-27 LAB — CHOLESTEROL, TOTAL: Cholesterol: 140 mg/dL (ref 0–200)

## 2012-02-27 LAB — MAGNESIUM: Magnesium: 2.1 mg/dL (ref 1.5–2.5)

## 2012-02-27 LAB — PHOSPHORUS: Phosphorus: 3.7 mg/dL (ref 2.3–4.6)

## 2012-02-27 LAB — TRIGLYCERIDES: Triglycerides: 102 mg/dL (ref <150)

## 2012-02-27 MED ORDER — POTASSIUM CHLORIDE 10 MEQ/50ML IV SOLN
10.0000 meq | INTRAVENOUS | Status: AC | PRN
  Administered 2012-02-27 (×3): 10 meq via INTRAVENOUS

## 2012-02-27 MED ORDER — CLINIMIX E/DEXTROSE (5/15) 5 % IV SOLN
INTRAVENOUS | Status: AC
  Administered 2012-02-27: 18:00:00 via INTRAVENOUS
  Filled 2012-02-27: qty 2200

## 2012-02-27 NOTE — Op Note (Signed)
NAMETRACI, PLEMONS             ACCOUNT NO.:  [id number]  MEDICAL RECORD NO.:  [id number]  LOCATION:                                 FACILITY:  PHYSICIAN:  Sheliah Plane, MD    DATE OF BIRTH:  [birth year]  DATE OF PROCEDURE:  02/23/2012 DATE OF DISCHARGE:                              OPERATIVE REPORT   PREOPERATIVE DIAGNOSIS:  Adenocarcinoma of the gastroesophageal junction.  POSTOPERATIVE DIAGNOSIS:  Adenocarcinoma of the gastroesophageal junction.  SURGICAL PROCEDURE:  Laparoscopic staging and mobilization of the foregut endoscopically, open distal esophageal proximal gastric resection with esophagogastroenterostomy, and placement of a feeding jejunostomy tube.  CO-SURGEONS:  Sheliah Plane, MD and [place]. Daphine Deutscher, MD  BRIEF HISTORY:  The patient is a 78 year old male who previously undergone aortic valve replacement in [place] last year from which he did well.  Recently, he presented with melanotic stools and an episode of hematemesis.  Evaluation with endoscopically and with esophageal ultrasound demonstrated an approximately 4 cm mass at the distal esophagus nonobstructing.  PET scan showed some areas in the lung question of inflammation but was not felt to have metastatic disease. Surgical resection was recommended after the patient had a second episode of hematemesis while in the hospital.  When this occurred, Gastroenterology saw the patient, and declined to re-scope in sclerosing as was done in [place] and he  underwent embolization of the left gastric artery.  The risks and options were discussed with the patient and his family in detail.  The procedure was done with co-surgeon, General Surgery, and Thoracic Surgery because of the location of the tumor.  DESCRIPTION OF PROCEDURE:  The patient underwent general endotracheal anesthesia without incident.  The abdomen, chest and left neck were prepped with Betadine and draped in sterile manner.  Initially,  we proceeded with laparoscopic staging and mobilization of the foregut. This is described in Dr. Ermalene Searing note.  Basically, the right crus and the short gastrics were mobilized, and a Penrose drain placed around the distal esophagus.  There was no evidence of wildly metastatic disease in the abdomen.  The endoscope was passed into the stomach and retroflex showed the tumor to be posteriorly in the stomach right at the GE junction.  At this point, we elected to open the upper abdomen and proceed with open resection.  The distal esophagus was mobilized into the posterior mediastinum.  The area along the lesser curve was very thickened.  The left gastric artery was firm probably related to the previous placed coils in thrombosis.  The greater curvature of the stomach was dissected down a little further.  The lesser curve was mobilized.  Using an Echelon stapler, the proximal third of the stomach was resected.  Stay sutures were placed on the distal esophagus approximately 1-1.5 cm above the GE junction and esophagus was divided at this point.  Under direct vision, a 2-0 Prolene suture was used to create a pursestring of the distal esophagus and squamous mucosa.  The end-to-end anastomosis stapler was used to create a distal anastomosis. The anterior gastrotomy was performed.  Previously, a stapler was passed and the posterior wall of the stomach was anastomosed to the anvil and fired.  Two complete rings were checked and sent to pathology.  The proximal and distal margins were negative for tumor.  Anterior gastrotomy was then closed with a TA stapler.  An 18-French red rubber catheter was whisteled into the proximal jejunum.  The catheter was brought out through a separate stab wound in the abdomen.  The small bowel was tacked to the abdominal wall.  With the operative field hemostatic, and sponge and needle count was reported as correct the abdominal closure with double-stranded #1 PDS were  used. Small trocar incisions were closed with Vicryl and skin staples.  Dry dressings were applied.  Estimated blood loss was less than 100 mL.  The patient tolerated the procedure without obvious complication.  Because of low hematocrit prior to surgery, he was transfused with a unit of packed red blood cells.     Sheliah Plane, MD     EG/MEDQ  D:  02/26/2012  T:  02/27/2012  Job:  [id number]

## 2012-02-27 NOTE — Progress Notes (Signed)
TCTS BRIEF SICU PROGRESS NOTE  4 Days Post-Op  S/P Procedure(s) (LRB): GASTRECTOMY (N/A) JEJUNOSTOMY (Left)   Stable day  Plan: Continue current plan  [name] H 02/27/2012 10:16 PM

## 2012-02-27 NOTE — Progress Notes (Addendum)
TCTS DAILY PROGRESS NOTE                   [name]            [name] [address]          [phone]      4 Days Post-Op Procedure(s) (LRB): GASTRECTOMY (N/A) JEJUNOSTOMY (Left)  Total Length of Stay:  LOS: 12 days   Subjective: No flatus, no nausea or vomiting, mild soreness with moderate PCA use  Objective: Vital signs in last 24 hours: Temp:  [97.5 F (36.4 C)-99.3 F (37.4 C)] 98.7 F (37.1 C) (10/22 0000) Pulse Rate:  [70-105] 80  (10/22 0700) Cardiac Rhythm:  [-] Normal sinus rhythm (10/22 0600) Resp:  [15-22] 20  (10/22 0700) BP: (130-162)/(54-97) 143/67 mmHg (10/22 0700) SpO2:  [91 %-100 %] 100 % (10/22 0700)  Filed Weights   02/15/12 2000 02/23/12 1345 02/26/12 0500  Weight: 177 lb 11.1 oz (80.6 kg) 176 lb 5.9 oz (80 kg) 171 lb 15.3 oz (78 kg)    Weight change:    Hemodynamic parameters for last 24 hours:    Intake/Output from previous day: 10/21 0701 - 10/22 0700 In: 2136.1 [I.V.:1270.8; IV Piggyback:120; TPN:715.3] Out: 2460 [Urine:2400; Drains:60]  Intake/Output this shift: Total I/O In: 50 [IV Piggyback:50] Out: -   Current Meds: Scheduled Meds:   . enoxaparin  40 mg Subcutaneous Q24H  . fentaNYL   Intravenous Q4H  . metoprolol  5 mg Intravenous Q6H  . pantoprazole (PROTONIX) IV  40 mg Intravenous Q12H  . prednisoLONE acetate  2 drop Right Eye QHS  . sodium chloride  10 mL Intravenous Q12H  . sodium chloride  10-40 mL Intracatheter Q12H   Continuous Infusions:   . dextrose 5 % and 0.45 % NaCl with KCl 20 mEq/L 50 mL/hr at 02/26/12 2240  . TPN (CLINIMIX) +/- additives 42 mL/hr at 02/26/12 1900   And  . fat emulsion 250 mL (02/26/12 1900)  . DISCONTD: nitroGLYCERIN Stopped (02/24/12 0630)   PRN Meds:.albuterol, ALPRAZolam, diphenhydrAMINE, diphenhydrAMINE, haloperidol lactate, midazolam, naloxone, ondansetron (ZOFRAN) IV, ondansetron (ZOFRAN) IV, ondansetron (ZOFRAN) IV, potassium chloride, potassium chloride,  sodium chloride, sodium chloride  General appearance: alert, cooperative and no distress Heart: regular rate and rhythm Lungs: clear to auscultation bilaterally Abdomen: + BS, mod distension, mild incision TTP Extremities: no edema Wound: incisions healing well, staples intact  Lab Results: CBC: Basename 02/27/12 0350 02/26/12 0414  WBC 8.3 8.5  HGB 8.5* 9.1*  HCT 26.0* 27.3*  PLT 216 185   BMET:  Basename 02/27/12 0350 02/26/12 0414  NA 136 134*  K 3.7 4.0  CL 100 101  CO2 29 28  GLUCOSE 127* 113*  BUN 7 7  CREATININE 0.56 0.59  CALCIUM 7.9* 8.0*    PT/INR: No results found for this basename: LABPROT,INR in the last 72 hours Radiology: Dg Abd Portable 1v  02/26/2012  *RADIOLOGY REPORT*  Clinical Data: Ileus versus small bowel obstruction  PORTABLE ABDOMEN - 1 VIEW  Comparison: 02/25/2012  Findings: Air filled loops of transverse colon.  Nondilated loops of small bowel in the left mid abdomen, improved.  While this may reflect improving adynamic ileus, it is certainly nonobstructive.  No evidence of free air under the diaphragm on the upright view.  Surgical drain in the right mid abdomen.  Overlying skin staples.  IMPRESSION: No evidence of small bowel obstruction or free air.  Possible improving adynamic ileus.   Original Report Authenticated  By: Charline Bills, M.D.    Dg Abd Portable 1v  02/25/2012  *RADIOLOGY REPORT*  Clinical Data: Postop abdominal distention  PORTABLE ABDOMEN - 1 VIEW  Comparison:   02/14/2012  Findings: Midline skin staples noted.  There is a left lower quadrant jejunostomy tube which crosses the midline with tip in the right lower quadrant.  Multiple air-filled loops of small bowel are identified within the left lower quadrant of the abdomen.  These measure up to 2.8 cm.  Gas and stool noted within the colon up to the rectum.  IMPRESSION:  1.  Nonspecific bowel gas pattern. 2.  Air-filled loops of small bowel and may represent low grade partial  obstruction or ileus.   Original Report Authenticated By: Rosealee Albee, M.D.      Assessment/Plan: S/P Procedure(s) (LRB): GASTRECTOMY (N/A) JEJUNOSTOMY (Left)  1 Making good progress, still with significant ileus but now with +BS 2 cont TNA 3 Cont PCA for now- he understands to limit use as much as feasible with ileus 4 labs stable , needs a little K+- In TNA 5 routine pulm toilet, cont rehab-ambulation going well   [name] 02/27/2012 7:30 AM  Stable, now with bowel sounds, wait until less distended then start tube feeding I have seen and examined [name] and agree with the above assessment  and plan.  Delight Ovens MD Beeper [phone] Office [phone] 02/27/2012 8:03 AM

## 2012-02-27 NOTE — Progress Notes (Signed)
Patient ID: [name], male   DOB: 11/22/1932, 78 y.o.   MRN: [id number] [facility] Surgery Progress Note:   4 Days Post-Op  Subjective: Mental status is clear.  Sitting up Objective: Vital signs in last 24 hours: Temp:  [97.5 F (36.4 C)-99.3 F (37.4 C)] 98.1 F (36.7 C) (10/22 0739) Pulse Rate:  [70-105] 80  (10/22 0700) Resp:  [15-22] 20  (10/22 0700) BP: (130-162)/(54-97) 143/67 mmHg (10/22 0700) SpO2:  [91 %-100 %] 100 % (10/22 0700)  Intake/Output from previous day: 10/21 0701 - 10/22 0700 In: 2136.1 [I.V.:1270.8; IV Piggyback:120; TPN:715.3] Out: 2460 [Urine:2400; Drains:60] Intake/Output this shift: Total I/O In: 50 [IV Piggyback:50] Out: -   Physical Exam: Work of breathing is appropriate-using the incentive spirometer without difficulty.  No flatus yet  Lab Results:  Results for orders placed during the hospital encounter of 02/15/12 (from the past 48 hour(s))  CBC WITH DIFFERENTIAL     Status: Abnormal   Collection Time   02/26/12  4:14 AM      Component Value Range Comment   WBC 8.5  4.0 - 10.5 K/uL    RBC 3.14 (*) 4.22 - 5.81 MIL/uL    Hemoglobin 9.1 (*) 13.0 - 17.0 g/dL    HCT 29.5 (*) 28.4 - 52.0 %    MCV 86.9  78.0 - 100.0 fL    MCH 29.0  26.0 - 34.0 pg    MCHC 33.3  30.0 - 36.0 g/dL    RDW 13.2  44.0 - 10.2 %    Platelets 185  150 - 400 K/uL    Neutrophils Relative 69  43 - 77 %    Neutro Abs 5.9  1.7 - 7.7 K/uL    Lymphocytes Relative 11 (*) 12 - 46 %    Lymphs Abs 1.0  0.7 - 4.0 K/uL    Monocytes Relative 9  3 - 12 %    Monocytes Absolute 0.8  0.1 - 1.0 K/uL    Eosinophils Relative 11 (*) 0 - 5 %    Eosinophils Absolute 0.9 (*) 0.0 - 0.7 K/uL    Basophils Relative 0  0 - 1 %    Basophils Absolute 0.0  0.0 - 0.1 K/uL   BASIC METABOLIC PANEL     Status: Abnormal   Collection Time   02/26/12  4:14 AM      Component Value Range Comment   Sodium 134 (*) 135 - 145 mEq/L    Potassium 4.0  3.5 - 5.1 mEq/L    Chloride 101  96 - 112  mEq/L    CO2 28  19 - 32 mEq/L    Glucose, Bld 113 (*) 70 - 99 mg/dL    BUN 7  6 - 23 mg/dL    Creatinine, Ser 7.25  0.50 - 1.35 mg/dL    Calcium 8.0 (*) 8.4 - 10.5 mg/dL    GFR calc non Af Amer >90  >90 mL/min    GFR calc Af Amer >90  >90 mL/min   COMPREHENSIVE METABOLIC PANEL     Status: Abnormal   Collection Time   02/27/12  3:50 AM      Component Value Range Comment   Sodium 136  135 - 145 mEq/L    Potassium 3.7  3.5 - 5.1 mEq/L    Chloride 100  96 - 112 mEq/L    CO2 29  19 - 32 mEq/L    Glucose, Bld 127 (*) 70 - 99 mg/dL  BUN 7  6 - 23 mg/dL    Creatinine, Ser 4.09  0.50 - 1.35 mg/dL    Calcium 7.9 (*) 8.4 - 10.5 mg/dL    Total Protein 4.9 (*) 6.0 - 8.3 g/dL    Albumin 2.0 (*) 3.5 - 5.2 g/dL    AST 12  0 - 37 U/L    ALT 27  0 - 53 U/L    Alkaline Phosphatase 38 (*) 39 - 117 U/L    Total Bilirubin 0.4  0.3 - 1.2 mg/dL    GFR calc non Af Amer >90  >90 mL/min    GFR calc Af Amer >90  >90 mL/min   MAGNESIUM     Status: Normal   Collection Time   02/27/12  3:50 AM      Component Value Range Comment   Magnesium 2.1  1.5 - 2.5 mg/dL   PHOSPHORUS     Status: Normal   Collection Time   02/27/12  3:50 AM      Component Value Range Comment   Phosphorus 3.7  2.3 - 4.6 mg/dL   CHOLESTEROL, TOTAL     Status: Normal   Collection Time   02/27/12  3:50 AM      Component Value Range Comment   Cholesterol 140  0 - 200 mg/dL   TRIGLYCERIDES     Status: Normal   Collection Time   02/27/12  3:50 AM      Component Value Range Comment   Triglycerides 102  <150 mg/dL   CBC     Status: Abnormal   Collection Time   02/27/12  3:50 AM      Component Value Range Comment   WBC 8.3  4.0 - 10.5 K/uL    RBC 3.02 (*) 4.22 - 5.81 MIL/uL    Hemoglobin 8.5 (*) 13.0 - 17.0 g/dL    HCT 81.1 (*) 91.4 - 52.0 %    MCV 86.1  78.0 - 100.0 fL    MCH 28.1  26.0 - 34.0 pg    MCHC 32.7  30.0 - 36.0 g/dL    RDW 78.2  95.6 - 21.3 %    Platelets 216  150 - 400 K/uL   DIFFERENTIAL     Status: Abnormal    Collection Time   02/27/12  3:50 AM      Component Value Range Comment   Neutrophils Relative 73  43 - 77 %    Neutro Abs 6.0  1.7 - 7.7 K/uL    Lymphocytes Relative 8 (*) 12 - 46 %    Lymphs Abs 0.7  0.7 - 4.0 K/uL    Monocytes Relative 11  3 - 12 %    Monocytes Absolute 0.9  0.1 - 1.0 K/uL    Eosinophils Relative 8 (*) 0 - 5 %    Eosinophils Absolute 0.7  0.0 - 0.7 K/uL    Basophils Relative 0  0 - 1 %    Basophils Absolute 0.0  0.0 - 0.1 K/uL     Radiology/Results: Dg Abd Portable 1v  02/26/2012  *RADIOLOGY REPORT*  Clinical Data: Ileus versus small bowel obstruction  PORTABLE ABDOMEN - 1 VIEW  Comparison: 02/25/2012  Findings: Air filled loops of transverse colon.  Nondilated loops of small bowel in the left mid abdomen, improved.  While this may reflect improving adynamic ileus, it is certainly nonobstructive.  No evidence of free air under the diaphragm on the upright view.  Surgical drain in the right mid abdomen.  Overlying skin staples.  IMPRESSION: No evidence of small bowel obstruction or free air.  Possible improving adynamic ileus.   Original Report Authenticated By: Charline Bills, M.D.    Dg Abd Portable 1v  02/25/2012  *RADIOLOGY REPORT*  Clinical Data: Postop abdominal distention  PORTABLE ABDOMEN - 1 VIEW  Comparison:   02/14/2012  Findings: Midline skin staples noted.  There is a left lower quadrant jejunostomy tube which crosses the midline with tip in the right lower quadrant.  Multiple air-filled loops of small bowel are identified within the left lower quadrant of the abdomen.  These measure up to 2.8 cm.  Gas and stool noted within the colon up to the rectum.  IMPRESSION:  1.  Nonspecific bowel gas pattern. 2.  Air-filled loops of small bowel and may represent low grade partial obstruction or ileus.   Original Report Authenticated By: Rosealee Albee, M.D.     Anti-infectives: Anti-infectives     Start     Dose/Rate Route Frequency Ordered Stop   02/23/12  1430   cefOXitin (MEFOXIN) 1 g in dextrose 5 % 50 mL IVPB        1 g 100 mL/hr over 30 Minutes Intravenous 4 times per day 02/23/12 1334 02/24/12 0644   02/23/12 1315   cefOXitin (MEFOXIN) 2 g in dextrose 5 % 50 mL IVPB        2 g 100 mL/hr over 30 Minutes Intravenous To Surgery 02/23/12 1305 02/23/12 1428   02/23/12 0600   cefOXitin (MEFOXIN) 2 g in dextrose 5 % 50 mL IVPB        2 g 100 mL/hr over 30 Minutes Intravenous  Once 02/22/12 0904 02/23/12 0737          Assessment/Plan: Problem List: Patient Active Problem List  Diagnosis  . Acute upper GI bleed  . Esophageal mass  . S/P aortic valve replacement    Path pending.  Ileus - on TNA.  Stable 4 Days Post-Op    LOS: 12 days   Matt B. Daphine Deutscher, MD, [facility] [phone] beeper [phone]  02/27/2012 8:05 AM

## 2012-02-27 NOTE — Progress Notes (Signed)
PARENTERAL NUTRITION CONSULT NOTE - FOLLOW UP  Pharmacy Consult for TPN Indication: prolonged ileus  No Known Allergies  Patient Measurements: Height: 5\' 7"  (170.2 cm) Weight: 171 lb 15.3 oz (78 kg) (standing weight scale) IBW/kg (Calculated) : 66.1  Adjusted Body Weight: 70kg Usual Weight: 82kg  Vital Signs: Temp: 98.1 F (36.7 C) (10/22 0739) Temp src: Oral (10/22 0739) BP: 143/67 mmHg (10/22 0700) Pulse Rate: 80  (10/22 0700) Intake/Output from previous day: 10/21 0701 - 10/22 0700 In: 2136.1 [I.V.:1270.8; IV Piggyback:120; TPN:715.3] Out: 2460 [Urine:2400; Drains:60] Intake/Output from this shift: Total I/O In: 50 [IV Piggyback:50] Out: -   Labs:  Basename 02/27/12 0350 02/26/12 0414 02/25/12 0455  WBC 8.3 8.5 11.2*  HGB 8.5* 9.1* 9.9*  HCT 26.0* 27.3* 29.8*  PLT 216 185 188  APTT -- -- --  INR -- -- --     Basename 02/27/12 0350 02/26/12 0414 02/25/12 0455  NA 136 134* 134*  K 3.7 4.0 4.3  CL 100 101 101  CO2 29 28 27   GLUCOSE 127* 113* 118*  BUN 7 7 6   CREATININE 0.56 0.59 0.63  LABCREA -- -- --  CREAT24HRUR -- -- --  CALCIUM 7.9* 8.0* 7.8*  MG 2.1 -- --  PHOS 3.7 -- --  PROT 4.9* -- --  ALBUMIN 2.0* -- --  AST 12 -- --  ALT 27 -- --  ALKPHOS 38* -- --  BILITOT 0.4 -- --  BILIDIR -- -- --  IBILI -- -- --  PREALBUMIN -- -- --  TRIG 102 -- --  CHOLHDL -- -- --  CHOL 140 -- --   Estimated Creatinine Clearance: 73.4 ml/min (by C-G formula based on Cr of 0.56).   No results found for this basename: GLUCAP:3 in the last 72 hours  Medications:  Scheduled:    . enoxaparin  40 mg Subcutaneous Q24H  . fentaNYL   Intravenous Q4H  . metoprolol  5 mg Intravenous Q6H  . pantoprazole (PROTONIX) IV  40 mg Intravenous Q12H  . prednisoLONE acetate  2 drop Right Eye QHS  . sodium chloride  10 mL Intravenous Q12H  . sodium chloride  10-40 mL Intracatheter Q12H   Infusions:    . dextrose 5 % and 0.45 % NaCl with KCl 20 mEq/L 50 mL/hr at 02/26/12  2240  . TPN (CLINIMIX) +/- additives 42 mL/hr at 02/26/12 1900   And  . fat emulsion 250 mL (02/26/12 1900)  . DISCONTD: nitroGLYCERIN Stopped (02/24/12 0630)    Insulin Requirements in the past 24 hours:  zero  Current Nutrition:  Clinimix E 5/15 at 42 ml/hr, remains npo  Assessment: 78 y/o male patient admitted with esophageal mass, now POD#4 s/p gastrectomy and jejunostomy requiring TPN for prolonged ileus. Patient tolerated initial rate, will increase to goal rate.  GI: ileus s/p gastrectomy/jejunostomy; no flatus/n/v; + BS; tolerated TPN at initial rate. Esophageal mass, bx pending Endo: cbg<150 Lytes: labs wnl; k below goal; MD replaced x3; Ca low but nL when corrected for low albumin Renal: scr output good Pulm: 100% on 1L [place] Cards: bp 143/67 Hepatobil: prealbumin pending; labs wnl Neuro: pca for pain ID:   afebrile Best Practices: lovenox for dvt px TPN Access: PICC TPN day#: 2    Nutritional Goals:  1650-1750 kCal, 105-115 grams of protein per day  Plan:  Increase Clinimix E 5/15 to goal rate of 54ml/hr. Goal rate of 90ml/hr will provide 1747kcal and 108 protein daily based upon 3 times weekly lipid supplementation.  Monitor cbg q8h and I/O status with daily tpn labs. Lipid and MVI supplements MWF d/t national backorder. Will also decrease maintenance IVF to kvo.  Verlene Mayer, PharmD, BCPS Pager [phone] 02/27/2012,8:10 AM

## 2012-02-28 ENCOUNTER — Encounter (HOSPITAL_COMMUNITY): Payer: Self-pay

## 2012-02-28 ENCOUNTER — Encounter (HOSPITAL_COMMUNITY): Payer: Self-pay | Admitting: Cardiothoracic Surgery

## 2012-02-28 LAB — BASIC METABOLIC PANEL
BUN: 10 mg/dL (ref 6–23)
CO2: 29 mEq/L (ref 19–32)
Calcium: 8.4 mg/dL (ref 8.4–10.5)
Chloride: 99 mEq/L (ref 96–112)
Creatinine, Ser: 0.53 mg/dL (ref 0.50–1.35)
GFR calc Af Amer: >90 mL/min (ref >90)
GFR calc non Af Amer: >90 mL/min (ref >90)
Glucose, Bld: 114 mg/dL — ABNORMAL HIGH (ref 70–99)
Potassium: 3.9 mEq/L (ref 3.5–5.1)
Sodium: 134 mEq/L — ABNORMAL LOW (ref 135–145)

## 2012-02-28 LAB — CBC
HCT: 27.5 % — ABNORMAL LOW (ref 39.0–52.0)
Hemoglobin: 9 g/dL — ABNORMAL LOW (ref 13.0–17.0)
MCH: 28 pg (ref 26.0–34.0)
MCHC: 32.7 g/dL (ref 30.0–36.0)
MCV: 85.7 fL (ref 78.0–100.0)
Platelets: 226 10*3/uL (ref 150–400)
RBC: 3.21 MIL/uL — ABNORMAL LOW (ref 4.22–5.81)
RDW: 13.9 % (ref 11.5–15.5)
WBC: 6.9 10*3/uL (ref 4.0–10.5)

## 2012-02-28 MED ORDER — METOPROLOL TARTRATE 12.5 MG HALF TABLET: 12.5000 mg | ORAL_TABLET | Freq: Two times a day (BID) | ORAL | Status: DC

## 2012-02-28 MED ORDER — ZINC TRACE METAL 1 MG/ML IV SOLN
INTRAVENOUS | Status: AC
  Administered 2012-02-28: 18:00:00 via INTRAVENOUS
  Filled 2012-02-28: qty 2000

## 2012-02-28 MED ORDER — METOPROLOL TARTRATE 25 MG/10 ML ORAL SUSPENSION
12.5000 mg | Freq: Two times a day (BID) | ORAL | Status: DC
  Administered 2012-02-28 (×2): 12.5 mg via JEJUNOSTOMY
  Filled 2012-02-28 (×4): qty 5

## 2012-02-28 MED ORDER — FAT EMULSION 20 % IV EMUL
250.0000 mL | INTRAVENOUS | Status: AC
  Administered 2012-02-28: 250 mL via INTRAVENOUS
  Filled 2012-02-28: qty 250

## 2012-02-28 NOTE — Progress Notes (Signed)
Patient ambulated 3 times around unit (900') on room air with monitor.  Tolerated well.

## 2012-02-28 NOTE — Progress Notes (Signed)
PARENTERAL NUTRITION CONSULT NOTE - FOLLOW UP  Pharmacy Consult for TPN Indication: prolonged ileus  No Known Allergies  Patient Measurements: Height: 5\' 7"  (170.2 cm) Weight: 168 lb 6.9 oz (76.4 kg) IBW/kg (Calculated) : 66.1  Adjusted Body Weight: 70kg Usual Weight: 82kg  Vital Signs: Temp: 99.1 F (37.3 C) (10/23 0729) Temp src: Oral (10/23 0729) BP: 159/62 mmHg (10/23 0800) Pulse Rate: 74  (10/23 0800) Intake/Output from previous day: 10/22 0701 - 10/23 0700 In: 2463.1 [I.V.:581.1; IV Piggyback:60; TPN:1732] Out: 1725 [Urine:1725] Intake/Output from this shift: Total I/O In: 150.5 [I.V.:30.5; Other:30; TPN:90] Out: 200 [Urine:200]  Labs:  [facility] 02/28/12 0355 02/27/12 0350 02/26/12 0414  WBC 6.9 8.3 8.5  HGB 9.0* 8.5* 9.1*  HCT 27.5* 26.0* 27.3*  PLT 226 216 185  APTT -- -- --  INR -- -- --     Basename 02/28/12 0355 02/27/12 0350 02/26/12 0414  NA 134* 136 134*  K 3.9 3.7 4.0  CL 99 100 101  CO2 29 29 28   GLUCOSE 114* 127* 113*  BUN 10 7 7   CREATININE 0.53 0.56 0.59  LABCREA -- -- --  CREAT24HRUR -- -- --  CALCIUM 8.4 7.9* 8.0*  MG -- 2.1 --  PHOS -- 3.7 --  PROT -- 4.9* --  ALBUMIN -- 2.0* --  AST -- 12 --  ALT -- 27 --  ALKPHOS -- 38* --  BILITOT -- 0.4 --  BILIDIR -- -- --  IBILI -- -- --  PREALBUMIN -- 8.8* --  TRIG -- 102 --  CHOLHDL -- -- --  CHOL -- 140 --   Estimated Creatinine Clearance: 73.4 ml/min (by C-G formula based on Cr of 0.53).    Basename 02/28/12 0727 02/27/12 1552 02/27/12 0738  GLUCAP 129* 115* 123*    Medications:  Scheduled:     . enoxaparin  40 mg Subcutaneous Q24H  . fentaNYL   Intravenous Q4H  . metoprolol  5 mg Intravenous Q6H  . metoprolol tartrate  12.5 mg Per J Tube BID  . pantoprazole (PROTONIX) IV  40 mg Intravenous Q12H  . prednisoLONE acetate  2 drop Right Eye QHS  . sodium chloride  10 mL Intravenous Q12H  . sodium chloride  10-40 mL Intracatheter Q12H   Infusions:     . dextrose 5 %  and 0.45 % NaCl with KCl 20 mEq/L 20 mL/hr at 02/27/12 0900  . TPN (CLINIMIX) +/- additives 42 mL/hr at 02/26/12 1900   And  . fat emulsion 250 mL (02/26/12 1900)  . TPN (CLINIMIX) +/- additives 90 mL/hr at 02/27/12 2000    Insulin Requirements in the past 24 hours:  zero  Current Nutrition:  Clinimix E 5/15 at 90 ml/hr, remains npo  Assessment: 78 y/o male patient admitted with esophageal mass, now POD#5 s/p gastrectomy and jejunostomy requiring TPN for prolonged ileus. Patient tolerating goal rate.  GI: ileus s/p gastrectomy/jejunostomy; no flatus/n/v; + BS; tolerated TPN at initial rate. Esophageal mass/carcinoma, possibly start TF tomorrow Endo: cbg<150 Lytes: labs wnl; Na slightly low Renal: scr /output good Pulm: 96% on ra Cards: bp 168/72, started lopressor down j-tube Hepatobil: prealbumin 8.8, below goal Neuro: no c/o , plan for sdu ID:   afebrile Best Practices: lovenox for dvt px TPN Access: PICC TPN day#: 3    Nutritional Goals:  1650-1750 kCal, 105-115 grams of protein per day  Plan:  Continue Clinimix E 5/15 at goal rate of 62ml/hr. Goal rate provides 1747kcal and 108 protein daily based upon  3 times weekly lipid supplementation. Monitor cbg q8h and I/O status with daily tpn labs. Lipid and MVI supplements MWF d/t national backorder.   Verlene Mayer, PharmD, BCPS Pager [phone] 02/28/2012,8:57 AM

## 2012-02-28 NOTE — Progress Notes (Signed)
Patient ID: [name], male   DOB: 1932-07-30, 78 y.o.   MRN: [id number] [facility] Progress Note:   5 Days Post-Op  Subjective: Mental status is clear.  Spoke with wife and he is cranky.  I did discuss his path with him briefly.  T3 N1 Mx Objective: Vital signs in last 24 hours: Temp:  [97.5 F (36.4 C)-99.9 F (37.7 C)] 99.1 F (37.3 C) (10/23 0729) Pulse Rate:  [71-90] 83  (10/23 0938) Resp:  [18-22] 18  (10/23 0900) BP: (137-168)/(61-91) 168/72 mmHg (10/23 0938) SpO2:  [92 %-99 %] 96 % (10/23 0900) Weight:  [168 lb 6.9 oz (76.4 kg)] 168 lb 6.9 oz (76.4 kg) (10/23 0600)  Intake/Output from previous day: 10/22 0701 - 10/23 0700 In: 2463.1 [I.V.:581.1; IV Piggyback:60; TPN:1732] Out: 1725 [Urine:1725] Intake/Output this shift: Total I/O In: 270.5 [I.V.:60.5; Other:30; TPN:180] Out: 200 [Urine:200]  Physical Exam: Work of breathing is normal.  Sitting up;  Scant flatus;  Incision OK with staples in place.    Lab Results:  Results for orders placed during the hospital encounter of 02/15/12 (from the past 48 hour(s))  GLUCOSE, CAPILLARY     Status: Abnormal   Collection Time   02/27/12 12:39 AM      Component Value Range Comment   Glucose-Capillary 134 (*) 70 - 99 mg/dL   COMPREHENSIVE METABOLIC PANEL     Status: Abnormal   Collection Time   02/27/12  3:50 AM      Component Value Range Comment   Sodium 136  135 - 145 mEq/L    Potassium 3.7  3.5 - 5.1 mEq/L    Chloride 100  96 - 112 mEq/L    CO2 29  19 - 32 mEq/L    Glucose, Bld 127 (*) 70 - 99 mg/dL    BUN 7  6 - 23 mg/dL    Creatinine, Ser 4.09  0.50 - 1.35 mg/dL    Calcium 7.9 (*) 8.4 - 10.5 mg/dL    Total Protein 4.9 (*) 6.0 - 8.3 g/dL    Albumin 2.0 (*) 3.5 - 5.2 g/dL    AST 12  0 - 37 U/L    ALT 27  0 - 53 U/L    Alkaline Phosphatase 38 (*) 39 - 117 U/L    Total Bilirubin 0.4  0.3 - 1.2 mg/dL    GFR calc non Af Amer >90  >90 mL/min    GFR calc Af Amer >90  >90 mL/min   PREALBUMIN      Status: Abnormal   Collection Time   02/27/12  3:50 AM      Component Value Range Comment   Prealbumin 8.8 (*) 17.0 - 34.0 mg/dL   MAGNESIUM     Status: Normal   Collection Time   02/27/12  3:50 AM      Component Value Range Comment   Magnesium 2.1  1.5 - 2.5 mg/dL   PHOSPHORUS     Status: Normal   Collection Time   02/27/12  3:50 AM      Component Value Range Comment   Phosphorus 3.7  2.3 - 4.6 mg/dL   CHOLESTEROL, TOTAL     Status: Normal   Collection Time   02/27/12  3:50 AM      Component Value Range Comment   Cholesterol 140  0 - 200 mg/dL   TRIGLYCERIDES     Status: Normal   Collection Time   02/27/12  3:50 AM  Component Value Range Comment   Triglycerides 102  <150 mg/dL   CBC     Status: Abnormal   Collection Time   02/27/12  3:50 AM      Component Value Range Comment   WBC 8.3  4.0 - 10.5 K/uL    RBC 3.02 (*) 4.22 - 5.81 MIL/uL    Hemoglobin 8.5 (*) 13.0 - 17.0 g/dL    HCT 08.6 (*) 57.8 - 52.0 %    MCV 86.1  78.0 - 100.0 fL    MCH 28.1  26.0 - 34.0 pg    MCHC 32.7  30.0 - 36.0 g/dL    RDW 46.9  62.9 - 52.8 %    Platelets 216  150 - 400 K/uL   DIFFERENTIAL     Status: Abnormal   Collection Time   02/27/12  3:50 AM      Component Value Range Comment   Neutrophils Relative 73  43 - 77 %    Neutro Abs 6.0  1.7 - 7.7 K/uL    Lymphocytes Relative 8 (*) 12 - 46 %    Lymphs Abs 0.7  0.7 - 4.0 K/uL    Monocytes Relative 11  3 - 12 %    Monocytes Absolute 0.9  0.1 - 1.0 K/uL    Eosinophils Relative 8 (*) 0 - 5 %    Eosinophils Absolute 0.7  0.0 - 0.7 K/uL    Basophils Relative 0  0 - 1 %    Basophils Absolute 0.0  0.0 - 0.1 K/uL   GLUCOSE, CAPILLARY     Status: Abnormal   Collection Time   02/27/12  7:38 AM      Component Value Range Comment   Glucose-Capillary 123 (*) 70 - 99 mg/dL    Comment 1 Documented in Chart      Comment 2 Notify RN     GLUCOSE, CAPILLARY     Status: Abnormal   Collection Time   02/27/12  3:52 PM      Component Value Range  Comment   Glucose-Capillary 115 (*) 70 - 99 mg/dL    Comment 1 Documented in Chart      Comment 2 Notify RN     BASIC METABOLIC PANEL     Status: Abnormal   Collection Time   02/28/12  3:55 AM      Component Value Range Comment   Sodium 134 (*) 135 - 145 mEq/L    Potassium 3.9  3.5 - 5.1 mEq/L    Chloride 99  96 - 112 mEq/L    CO2 29  19 - 32 mEq/L    Glucose, Bld 114 (*) 70 - 99 mg/dL    BUN 10  6 - 23 mg/dL    Creatinine, Ser 4.13  0.50 - 1.35 mg/dL    Calcium 8.4  8.4 - 24.4 mg/dL    GFR calc non Af Amer >90  >90 mL/min    GFR calc Af Amer >90  >90 mL/min   CBC     Status: Abnormal   Collection Time   02/28/12  3:55 AM      Component Value Range Comment   WBC 6.9  4.0 - 10.5 K/uL    RBC 3.21 (*) 4.22 - 5.81 MIL/uL    Hemoglobin 9.0 (*) 13.0 - 17.0 g/dL    HCT 01.0 (*) 27.2 - 52.0 %    MCV 85.7  78.0 - 100.0 fL    MCH 28.0  26.0 - 34.0  pg    MCHC 32.7  30.0 - 36.0 g/dL    RDW 86.5  78.4 - 69.6 %    Platelets 226  150 - 400 K/uL   GLUCOSE, CAPILLARY     Status: Abnormal   Collection Time   02/28/12  7:27 AM      Component Value Range Comment   Glucose-Capillary 129 (*) 70 - 99 mg/dL     Radiology/Results: Dg Abd Portable 1v  02/26/2012  *RADIOLOGY REPORT*  Clinical Data: Ileus versus small bowel obstruction  PORTABLE ABDOMEN - 1 VIEW  Comparison: 02/25/2012  Findings: Air filled loops of transverse colon.  Nondilated loops of small bowel in the left mid abdomen, improved.  While this may reflect improving adynamic ileus, it is certainly nonobstructive.  No evidence of free air under the diaphragm on the upright view.  Surgical drain in the right mid abdomen.  Overlying skin staples.  IMPRESSION: No evidence of small bowel obstruction or free air.  Possible improving adynamic ileus.   Original Report Authenticated By: Charline Bills, M.D.     Anti-infectives: Anti-infectives     Start     Dose/Rate Route Frequency Ordered Stop   02/23/12 1430   cefOXitin (MEFOXIN) 1 g  in dextrose 5 % 50 mL IVPB        1 g 100 mL/hr over 30 Minutes Intravenous 4 times per day 02/23/12 1334 02/24/12 0644   02/23/12 1315   cefOXitin (MEFOXIN) 2 g in dextrose 5 % 50 mL IVPB        2 g 100 mL/hr over 30 Minutes Intravenous To Surgery 02/23/12 1305 02/23/12 1428   02/23/12 0600   cefOXitin (MEFOXIN) 2 g in dextrose 5 % 50 mL IVPB        2 g 100 mL/hr over 30 Minutes Intravenous  Once 02/22/12 0904 02/23/12 0737          Assessment/Plan: Problem List: Patient Active Problem List  Diagnosis  . Acute upper GI bleed  . Gastric cancer  . S/P aortic valve replacement    TNA meeting some nutritional needs.  Will start some jejunal feedings as soon as ileus has resolved.  5 Days Post-Op    LOS: 13 days   Matt B. Daphine Deutscher, MD, [facility] [phone] beeper [phone]  02/28/2012 10:59 AM

## 2012-02-28 NOTE — Progress Notes (Signed)
Report called to Raynelle Fanning, RN and met RN at bedside.  Patient transferred to 2017 and attached to tele monitor without complications.

## 2012-02-28 NOTE — Progress Notes (Addendum)
TCTS DAILY PROGRESS NOTE                   [address]            [name] [address]          [phone]      5 Days Post-Op Procedure(s) (LRB): GASTRECTOMY (N/A) JEJUNOSTOMY (Left)  Total Length of Stay:  LOS: 13 days   Subjective: Feels pretty well, no nausea, mild pain  Objective: Vital signs in last 24 hours: Temp:  [97.5 F (36.4 C)-99.9 F (37.7 C)] 98.7 F (37.1 C) (10/23 0400) Pulse Rate:  [71-90] 85  (10/23 0700) Cardiac Rhythm:  [-] Normal sinus rhythm (10/22 2000) Resp:  [16-22] 21  (10/23 0700) BP: (137-163)/(61-91) 160/77 mmHg (10/23 0700) SpO2:  [92 %-99 %] 94 % (10/23 0700) Weight:  [168 lb 6.9 oz (76.4 kg)] 168 lb 6.9 oz (76.4 kg) (10/23 0600)  Filed Weights   02/23/12 1345 02/26/12 0500 02/28/12 0600  Weight: 176 lb 5.9 oz (80 kg) 171 lb 15.3 oz (78 kg) 168 lb 6.9 oz (76.4 kg)    Weight change:    Hemodynamic parameters for last 24 hours:    Intake/Output from previous day: 10/22 0701 - 10/23 0700 In: 2453.1 [I.V.:581.1; IV Piggyback:50; TPN:1732] Out: 1725 [Urine:1725]  Intake/Output this shift:    Current Meds: Scheduled Meds:   . enoxaparin  40 mg Subcutaneous Q24H  . fentaNYL   Intravenous Q4H  . metoprolol  5 mg Intravenous Q6H  . pantoprazole (PROTONIX) IV  40 mg Intravenous Q12H  . prednisoLONE acetate  2 drop Right Eye QHS  . sodium chloride  10 mL Intravenous Q12H  . sodium chloride  10-40 mL Intracatheter Q12H   Continuous Infusions:   . dextrose 5 % and 0.45 % NaCl with KCl 20 mEq/L 20 mL/hr at 02/27/12 0900  . TPN (CLINIMIX) +/- additives 42 mL/hr at 02/26/12 1900   And  . fat emulsion 250 mL (02/26/12 1900)  . TPN (CLINIMIX) +/- additives 90 mL/hr at 02/27/12 2000   PRN Meds:.albuterol, ALPRAZolam, diphenhydrAMINE, diphenhydrAMINE, haloperidol lactate, midazolam, naloxone, ondansetron (ZOFRAN) IV, ondansetron (ZOFRAN) IV, ondansetron (ZOFRAN) IV, potassium chloride, potassium chloride, sodium  chloride, sodium chloride  General appearance: alert, cooperative and no distress Heart: regular rate and rhythm Lungs: clear to auscultation bilaterally Abdomen: soft, + distension, not hearing BS this am Extremities: no edema Wound: incisions healing well  Lab Results: CBC: Basename 02/28/12 0355 02/27/12 0350  WBC 6.9 8.3  HGB 9.0* 8.5*  HCT 27.5* 26.0*  PLT 226 216   BMET:  Basename 02/28/12 0355 02/27/12 0350  NA 134* 136  K 3.9 3.7  CL 99 100  CO2 29 29  GLUCOSE 114* 127*  BUN 10 7  CREATININE 0.53 0.56  CALCIUM 8.4 7.9*    PT/INR: No results found for this basename: LABPROT,INR in the last 72 hours Radiology: Dg Abd Portable 1v  02/26/2012  *RADIOLOGY REPORT*  Clinical Data: Ileus versus small bowel obstruction  PORTABLE ABDOMEN - 1 VIEW  Comparison: 02/25/2012  Findings: Air filled loops of transverse colon.  Nondilated loops of small bowel in the left mid abdomen, improved.  While this may reflect improving adynamic ileus, it is certainly nonobstructive.  No evidence of free air under the diaphragm on the upright view.  Surgical drain in the right mid abdomen.  Overlying skin staples.  IMPRESSION: No evidence of small bowel obstruction or free air.  Possible improving adynamic  ileus.   Original Report Authenticated By: Charline Bills, M.D.      Assessment/Plan: S/P Procedure(s) (LRB): GASTRECTOMY (N/A) JEJUNOSTOMY (Left)  1. Ileus persists, cont TNA/NPO 2 cont rehab- ambulating well 3 labs stable 4 will need BP meds restarted 5 poss tx to floor soon  GOLD,[name] 02/28/2012 7:28 AM    Still some abdominal distention, but positive bowel sounds continue tna until another day then consider tube feeding  Path back and discussed with patien  Stage IIB signet ring adenoca [id number]  To circle today  I have seen and examined [name] and agree with the above assessment  and plan.  Delight Ovens MD Beeper [phone] Office  [phone] 02/28/2012 8:50 AM

## 2012-02-29 LAB — CBC
HCT: 26.3 % — ABNORMAL LOW (ref 39.0–52.0)
Hemoglobin: 8.6 g/dL — ABNORMAL LOW (ref 13.0–17.0)
MCH: 27.9 pg (ref 26.0–34.0)
MCHC: 32.7 g/dL (ref 30.0–36.0)
MCV: 85.4 fL (ref 78.0–100.0)
Platelets: 225 10*3/uL (ref 150–400)
RBC: 3.08 MIL/uL — ABNORMAL LOW (ref 4.22–5.81)
RDW: 14.1 % (ref 11.5–15.5)
WBC: 7.3 10*3/uL (ref 4.0–10.5)

## 2012-02-29 LAB — COMPREHENSIVE METABOLIC PANEL
ALT: 23 U/L (ref 0–53)
AST: 20 U/L (ref 0–37)
Albumin: 2.1 g/dL — ABNORMAL LOW (ref 3.5–5.2)
Alkaline Phosphatase: 70 U/L (ref 39–117)
BUN: 10 mg/dL (ref 6–23)
CO2: 26 mEq/L (ref 19–32)
Calcium: 8.3 mg/dL — ABNORMAL LOW (ref 8.4–10.5)
Chloride: 99 mEq/L (ref 96–112)
Creatinine, Ser: 0.53 mg/dL (ref 0.50–1.35)
GFR calc Af Amer: >90 mL/min (ref >90)
GFR calc non Af Amer: >90 mL/min (ref >90)
Glucose, Bld: 112 mg/dL — ABNORMAL HIGH (ref 70–99)
Potassium: 3.7 mEq/L (ref 3.5–5.1)
Sodium: 134 mEq/L — ABNORMAL LOW (ref 135–145)
Total Bilirubin: 0.4 mg/dL (ref 0.3–1.2)
Total Protein: 5.4 g/dL — ABNORMAL LOW (ref 6.0–8.3)

## 2012-02-29 LAB — MAGNESIUM: Magnesium: 2 mg/dL (ref 1.5–2.5)

## 2012-02-29 LAB — GLUCOSE, CAPILLARY
Glucose-Capillary: 125 mg/dL — ABNORMAL HIGH (ref 70–99)
Glucose-Capillary: 120 mg/dL — ABNORMAL HIGH (ref 70–99)

## 2012-02-29 LAB — PHOSPHORUS: Phosphorus: 3.6 mg/dL (ref 2.3–4.6)

## 2012-02-29 MED ORDER — METOPROLOL TARTRATE 25 MG/10 ML ORAL SUSPENSION
25.0000 mg | Freq: Two times a day (BID) | ORAL | Status: DC
  Administered 2012-02-29 – 2012-03-01 (×4): 25 mg via JEJUNOSTOMY
  Filled 2012-02-29 (×6): qty 10

## 2012-02-29 MED ORDER — CLINIMIX E/DEXTROSE (5/15) 5 % IV SOLN
INTRAVENOUS | Status: AC
  Administered 2012-02-29: 18:00:00 via INTRAVENOUS
  Filled 2012-02-29: qty 2200

## 2012-02-29 NOTE — Progress Notes (Signed)
Nutrition Follow-up  Intervention:    TPN per pharmacy  When medically appropriate, recommend initiation of Vital AF 1.2 formula via J-tube at 20 ml/hr and increase 10 ml every 6 hours to goal rate of 70 ml/hr to provide 2016 kcals, 126 gm protein, 1362 ml of free water RD to follow for nutrition care plan  Assessment:   Ileus stable. No flatus or BM yet. To start EN via J-tube once ileus resolved.  Patient is receiving TPN with Clinimix E 5/15 @ 90 ml/hr.  Lipids (20% IVFE @ 10.4 ml/hr), multivitamins, and trace elements are provided 3 times weekly (MWF) due to national backorder.  Provides 1747 kcal and 108 grams protein daily (based on weekly average).  Meets 87% minimum estimated kcal and 94% minimum estimated protein needs.  Diet Order:  NPO  Meds: Scheduled Meds:   . enoxaparin  40 mg Subcutaneous Q24H  . fentaNYL   Intravenous Q4H  . metoprolol tartrate  25 mg Per J Tube BID  . pantoprazole (PROTONIX) IV  40 mg Intravenous Q12H  . prednisoLONE acetate  2 drop Right Eye QHS  . sodium chloride  10 mL Intravenous Q12H  . sodium chloride  10-40 mL Intracatheter Q12H  . DISCONTD: metoprolol tartrate  12.5 mg Per J Tube BID   Continuous Infusions:   . dextrose 5 % and 0.45 % NaCl with KCl 20 mEq/L 20 mL/hr at 02/28/12 1300  . TPN (CLINIMIX) +/- additives 90 mL/hr at 02/28/12 1756   And  . fat emulsion 250 mL (02/28/12 1800)  . TPN (CLINIMIX) +/- additives 90 mL/hr at 02/28/12 1300  . TPN (CLINIMIX) +/- additives     PRN Meds:.albuterol, diphenhydrAMINE, diphenhydrAMINE, naloxone, ondansetron (ZOFRAN) IV, ondansetron (ZOFRAN) IV, ondansetron (ZOFRAN) IV, potassium chloride, sodium chloride, sodium chloride  Labs:  CMP     Component Value Date/Time   NA 134* 02/29/2012 0500   K 3.7 02/29/2012 0500   CL 99 02/29/2012 0500   CO2 26 02/29/2012 0500   GLUCOSE 112* 02/29/2012 0500   BUN 10 02/29/2012 0500   CREATININE 0.53 02/29/2012 0500   CALCIUM 8.3* 02/29/2012 0500   PROT 5.4* 02/29/2012 0500   ALBUMIN 2.1* 02/29/2012 0500   AST 20 02/29/2012 0500   ALT 23 02/29/2012 0500   ALKPHOS 70 02/29/2012 0500   BILITOT 0.4 02/29/2012 0500   GFRNONAA >90 02/29/2012 0500   GFRAA >90 02/29/2012 0500    Magnesium  Date Value Range Status  02/29/2012 2.0  1.5 - 2.5 mg/dL Final    Phosphorus  Date Value Range Status  02/29/2012 3.6  2.3 - 4.6 mg/dL Final     Intake/Output Summary (Last 24 hours) at 02/29/12 1016 Last data filed at 02/29/12 0900  Gross per 24 hour  Intake  891.9 ml  Output    950 ml  Net  -58.1 ml    CBG (last 3)   Basename 02/29/12 0759 02/28/12 2356 02/28/12 1948  GLUCAP 125* 129* 121*    Weight Status: 77.6 kg (10/24) -- fluctuating  Re-estimated needs: 2000-2200 kcals, 115-125 gm protein   Nutrition Dx: Inadequate Oral Intake r/t inability to eat, s/p esophagogastrostomy as evidenced by NPO status, ongoing  New Goal: TPN to meet >90% of estimated protein needs, maximize energy provision as able during national lipid backorder, met  Monitor: EN initiation, TPN prescription, weight, labs, I/O's  Kirkland Hun, RD, LDN Pager #: [phone] After-Hours Pager #: [phone]

## 2012-02-29 NOTE — Progress Notes (Signed)
PARENTERAL NUTRITION CONSULT NOTE - FOLLOW UP  Pharmacy Consult for TPN Indication: prolonged ileus  No Known Allergies  Patient Measurements: Height: 5\' 7"  (170.2 cm) Weight: 171 lb 3.2 oz (77.656 kg) IBW/kg (Calculated) : 66.1  Adjusted Body Weight: 70kg Usual Weight: 82kg  Vital Signs: Temp: 97.9 F (36.6 C) (10/24 0431) Temp src: Oral (10/24 0431) BP: 158/85 mmHg (10/24 0431) Pulse Rate: 85  (10/24 0431) Intake/Output from previous day: 10/23 0701 - 10/24 0700 In: 1282.4 [I.V.:242; IV Piggyback:10; TPN:1000.4] Out: 950 [Urine:950] Intake/Output from this shift:    Labs:  [facility] 02/29/12 0500 02/28/12 0355 02/27/12 0350  WBC 7.3 6.9 8.3  HGB 8.6* 9.0* 8.5*  HCT 26.3* 27.5* 26.0*  PLT 225 226 216  APTT -- -- --  INR -- -- --     Basename 02/29/12 0500 02/28/12 0355 02/27/12 0350  NA 134* 134* 136  K 3.7 3.9 3.7  CL 99 99 100  CO2 26 29 29   GLUCOSE 112* 114* 127*  BUN 10 10 7   CREATININE 0.53 0.53 0.56  LABCREA -- -- --  CREAT24HRUR -- -- --  CALCIUM 8.3* 8.4 7.9*  MG 2.0 -- 2.1  PHOS 3.6 -- 3.7  PROT 5.4* -- 4.9*  ALBUMIN 2.1* -- 2.0*  AST 20 -- 12  ALT 23 -- 27  ALKPHOS 70 -- 38*  BILITOT 0.4 -- 0.4  BILIDIR -- -- --  IBILI -- -- --  PREALBUMIN -- -- 8.8*  TRIG -- -- 102  CHOLHDL -- -- --  CHOL -- -- 140   Estimated Creatinine Clearance: 73.4 ml/min (by C-G formula based on Cr of 0.53).    Basename 02/29/12 0759 02/28/12 2356 02/28/12 1948  GLUCAP 125* 129* 121*    Medications:  Scheduled:     . enoxaparin  40 mg Subcutaneous Q24H  . fentaNYL   Intravenous Q4H  . metoprolol tartrate  25 mg Per J Tube BID  . pantoprazole (PROTONIX) IV  40 mg Intravenous Q12H  . prednisoLONE acetate  2 drop Right Eye QHS  . sodium chloride  10 mL Intravenous Q12H  . sodium chloride  10-40 mL Intracatheter Q12H  . DISCONTD: metoprolol  5 mg Intravenous Q6H  . DISCONTD: metoprolol tartrate  12.5 mg Per J Tube BID  . DISCONTD: metoprolol tartrate   12.5 mg Per J Tube BID   Infusions:     . dextrose 5 % and 0.45 % NaCl with KCl 20 mEq/L 20 mL/hr at 02/28/12 1300  . TPN (CLINIMIX) +/- additives 90 mL/hr at 02/28/12 1756   And  . fat emulsion 250 mL (02/28/12 1800)  . TPN (CLINIMIX) +/- additives 90 mL/hr at 02/28/12 1300    Insulin Requirements in the past 24 hours:  zero  Current Nutrition:  Clinimix E 5/15 at 90 ml/hr, remains npo  Assessment: 78 y/o male patient admitted with esophageal mass, now POD#6 s/p gastrectomy and jejunostomy requiring TPN for prolonged ileus. Patient tolerating goal rate.  GI: ileus s/p gastrectomy/jejunostomy; no flatus/n/v; + BS; tolerated TPN at goal rate. Esophageal mass/carcinoma, possibly start TF down jtube Endo: cbg<150 Lytes: labs wnl; Na slightly low Renal: scr /output good Pulm: 96% on ra Cards: bp 158/85, lopressor down j-tube Hepatobil: prealbumin 8.8, below goal Neuro: no c/o ; fent pca ID:   afebrile Best Practices: lovenox for dvt px TPN Access: PICC TPN day#: 4    Nutritional Goals:  1650-1750 kCal, 105-115 grams of protein per day  Plan:  Continue Clinimix E  5/15 at goal rate of 7ml/hr. Goal rate provides 1747kcal and 108 protein daily based upon 3 times weekly lipid supplementation. Monitor cbg q8h and I/O status with daily tpn labs. Lipid and MVI supplements MWF d/t national backorder.   Verlene Mayer, PharmD, BCPS Pager [phone] 02/29/2012,8:34 AM

## 2012-02-29 NOTE — Progress Notes (Signed)
Pt ambulated 400 feet x1 assist with wheel chair. On room air pt tolerated well, pt back in chair call bell within reach will monitor patient. Kuba Shepherd, Randall An RN

## 2012-02-29 NOTE — Progress Notes (Addendum)
[address]            [place] [address]          [phone]     6 Days Post-Op Procedure(s) (LRB): GASTRECTOMY (N/A) JEJUNOSTOMY (Left)  Subjective: No nausea this am.  Walked several times yesterday.  No flatus or BM.   Objective: Vital signs in last 24 hours: Patient Vitals for the past 24 hrs:  BP Temp Temp src Pulse Resp SpO2 Weight  02/29/12 0431 158/85 mmHg 97.9 F (36.6 C) Oral 85  18  94 % 171 lb 3.2 oz (77.656 kg)  02/28/12 2024 - - - - 18  94 % -  02/28/12 2005 162/82 mmHg - - - - - -  02/28/12 1951 180/79 mmHg 98.2 F (36.8 C) Oral 85  20  94 % -  02/28/12 1842 172/84 mmHg 99 F (37.2 C) Oral 88  20  93 % -  02/28/12 1800 160/68 mmHg - - 88  20  97 % -  02/28/12 1700 156/74 mmHg - - 87  24  95 % -  02/28/12 1600 161/96 mmHg - - 92  25  97 % -  02/28/12 1519 - 98.8 F (37.1 C) Oral - - - -  02/28/12 1500 162/71 mmHg - - 104  - 97 % -  02/28/12 1400 168/75 mmHg - - 90  20  96 % -  02/28/12 1300 160/74 mmHg - - 89  23  95 % -  02/28/12 1200 163/76 mmHg - - 80  22  97 % -  02/28/12 1140 - 98.7 F (37.1 C) Oral - - - -  02/28/12 1100 147/74 mmHg - - 81  21  96 % -  02/28/12 0938 168/72 mmHg - - 83  - - -  02/28/12 0900 168/72 mmHg - - 82  18  96 % -  02/28/12 0800 159/62 mmHg - - 74  20  96 % -   Current Weight  02/29/12 171 lb 3.2 oz (77.656 kg)     Intake/Output from previous day: 10/23 0701 - 10/24 0700 In: 1282.4 [I.V.:242; IV Piggyback:10; TPN:1000.4] Out: 950 [Urine:950]  CBGs 114-121-129-112  PHYSICAL EXAM:  Heart: RRR Lungs: Clear Wound: Clean and dry Abdomen: soft but distended, few BS    Lab Results: CBC: Basename 02/29/12 0500 02/28/12 0355  WBC 7.3 6.9  HGB 8.6* 9.0*  HCT 26.3* 27.5*  PLT 225 226   BMET:  Basename 02/29/12 0500 02/28/12 0355  NA 134* 134*  K 3.7 3.9  CL 99 99  CO2 26 29  GLUCOSE 112* 114*  BUN 10 10  CREATININE 0.53 0.53  CALCIUM 8.3* 8.4    PT/INR: No results  found for this basename: LABPROT,INR in the last 72 hours    Assessment/Plan: S/P Procedure(s) (LRB): GASTRECTOMY (N/A) JEJUNOSTOMY (Left) Ileus- stable.  Continue NPO, TFs for now. Hopefully once this starts to resolve, we can start feeding via J tube. HTN- BPs high.  Will increase Lopressor and watch. Mobilize, continue pulm toilet/IS.   LOS: 14 days    COLLINS,GINA H 02/29/2012    Chart reviewed, patient examined, agree with above.  He has bowel sounds this am but still distended. No flatus or BM yet but said he feels like he might be getting ready to pass something. Will keep NPO on TNA until bowels working and distention  resolves. Dr. Tyrone Sage was planning GG swallow tomorrow but will wait until gut function returns.

## 2012-02-29 NOTE — Progress Notes (Signed)
Pt had a 5 beat run of V-Tach.  Pt was asymptomatic.  Will continue to monitor.

## 2012-02-29 NOTE — Progress Notes (Signed)
Pt ambulated x 2 assist with wheelchair 650 feet on room air and tolerated well. Pt back in chair call bell in reach, willmonitor patient. Whittley Carandang, Randall An RN

## 2012-02-29 NOTE — Progress Notes (Signed)
Pt ambulated x1 assist with wheel chair, on room air, tolerated well  650 feet. Pt back in chair call bell in reach will monitor patient. Mavrik Bynum, Randall An RN

## 2012-03-01 LAB — GLUCOSE, CAPILLARY

## 2012-03-01 MED ORDER — FAT EMULSION 20 % IV EMUL
250.0000 mL | INTRAVENOUS | Status: AC
  Administered 2012-03-01: 250 mL via INTRAVENOUS
  Filled 2012-03-01 (×2): qty 250

## 2012-03-01 MED ORDER — JEVITY 1.2 CAL PO LIQD
1000.0000 mL | ORAL | Status: DC
  Administered 2012-03-01 – 2012-03-02 (×2): 1000 mL
  Filled 2012-03-01 (×4): qty 1000

## 2012-03-01 MED ORDER — TRACE MINERALS CR-CU-F-FE-I-MN-MO-SE-ZN IV SOLN
INTRAVENOUS | Status: AC
  Administered 2012-03-01: 18:00:00 via INTRAVENOUS
  Filled 2012-03-01: qty 2200

## 2012-03-01 NOTE — Progress Notes (Signed)
Pt amb 500 ft x1 assist with W/C.  Pt tolerated well. Pt back in chair with call bell in reach. Will continue to monitor.

## 2012-03-01 NOTE — Progress Notes (Addendum)
                    [address]            [place] [address]          [phone]     7 Days Post-Op Procedure(s) (LRB): GASTRECTOMY (N/A) JEJUNOSTOMY (Left)  Subjective: Doesn't feel quite as well this am.  Walked a lot yesterday.  Passed a small amount of flatus this am, no BM.  Denies nausea or pain.   Objective: Vital signs in last 24 hours: Patient Vitals for the past 24 hrs:  BP Temp Temp src Pulse Resp SpO2 Weight  03/01/12 0735 - - - - 16  96 % -  03/01/12 0539 152/82 mmHg 98.3 F (36.8 C) Oral 90  19  95 % 172 lb 11.2 oz (78.336 kg)  03/01/12 0400 - - - - 19  98 % -  02/29/12 2321 - - - - 21  95 % -  02/29/12 2016 162/71 mmHg 99.6 F (37.6 C) Oral 80  18  95 % -  02/29/12 2000 - - - - 23  94 % -  02/29/12 1600 - - - - 26  96 % -  02/29/12 1410 151/68 mmHg 98 F (36.7 C) - 83  18  98 % -  02/29/12 1200 - - - - 23  99 % -  02/29/12 1106 168/77 mmHg - - 84  - - -  02/29/12 0800 - - - - 18  95 % -   Current Weight  03/01/12 172 lb 11.2 oz (78.336 kg)     Intake/Output from previous day: 10/24 0701 - 10/25 0700 In: 2858.4 [P.O.:720; TPN:2108.4] Out: 875 [Urine:875]    PHYSICAL EXAM:  Heart: RRR Lungs: Clear Wound: Clean and dry Abdomen: soft, slightly less distended today, +BS more active    Lab Results: CBC: Basename 02/29/12 0500 02/28/12 0355  WBC 7.3 6.9  HGB 8.6* 9.0*  HCT 26.3* 27.5*  PLT 225 226   BMET:  Basename 02/29/12 0500 02/28/12 0355  NA 134* 134*  K 3.7 3.9  CL 99 99  CO2 26 29  GLUCOSE 112* 114*  BUN 10 10  CREATININE 0.53 0.53  CALCIUM 8.3* 8.4    PT/INR: No results found for this basename: LABPROT,INR in the last 72 hours    Assessment/Plan: S/P Procedure(s) (LRB): GASTRECTOMY (N/A) JEJUNOSTOMY (Left) Ileus- resolving.  Continue NPO for now.  Hopefully if his bowel function continues to improve, we can consider switching from TNA to TFs via his J tube. HTN- BPs a little better after Lopressor  increased yesterday.  May need to increase further if BPs remain high. Continue ambulation, pulm toilet.      LOS: 15 days    COLLINS,GINA H 03/01/2012   Passing more flatus and had a BM. Jevity started by Dr. Daphine Deutscher. Will continue TNA for now and plan GG swallow Monday,

## 2012-03-01 NOTE — Progress Notes (Signed)
Nutrition Follow-up  Intervention:    Continue same TPN for now.  If Jevity 1.2 at 15 ml/h is tolerated and no leak found on gastrografin swallow, recommend increase by 10 ml every 6 hours to goal rate of 70 ml/h with Prostat 30 ml BID to provide 2216 kcals, 123 gm protein, 1361 ml free water daily.  If Jevity 1.2 is not tolerated, can switch to Vital AF 1.2 formula via J-tube at 20 ml/hr and increase by 10 ml every 6 hours to goal rate of 70 ml/hr to provide 2016 kcals, 126 gm protein, 1362 ml of free water. RD to follow for nutrition care plan.  Assessment:   Ileus is resolving.  Passing more flatus and had a BM today. Trickle TF of Jevity 1.2 at 15 ml/h has been ordered via J-tube.  TPN also continues.  Plans for gastrografin swallow on Monday.  Patient is receiving TPN with Clinimix E 5/15 @ 90 ml/hr.  Lipids (20% IVFE @ 10.4 ml/hr), multivitamins, and trace elements are provided 3 times weekly (MWF) due to national backorder.  Provides 1747 kcal and 108 grams protein daily (based on weekly average).  Meets 87% minimum estimated kcal and 94% minimum estimated protein needs.  Diet Order:  NPO  TF order:  Jevity 1.2 at 15 ml/h will provide 432 kcals, 20 gm protein, 292 ml free water daily.  TF plus TPN will provide a total of 2179 kcals, 128 gm protein daily, 100% of estimated nutrition needs.  Meds: Scheduled Meds:    . enoxaparin  40 mg Subcutaneous Q24H  . feeding supplement (JEVITY 1.2 CAL)  1,000 mL Per Tube Q24H  . fentaNYL   Intravenous Q4H  . metoprolol tartrate  25 mg Per J Tube BID  . pantoprazole (PROTONIX) IV  40 mg Intravenous Q12H  . prednisoLONE acetate  2 drop Right Eye QHS  . sodium chloride  10 mL Intravenous Q12H  . sodium chloride  10-40 mL Intracatheter Q12H   Continuous Infusions:    . dextrose 5 % and 0.45 % NaCl with KCl 20 mEq/L 20 mL/hr at 02/28/12 1300  . TPN (CLINIMIX) +/- additives 90 mL/hr at 02/28/12 1756   And  . fat emulsion 250 mL (02/28/12  1800)  . TPN (CLINIMIX) +/- additives     And  . fat emulsion    . TPN (CLINIMIX) +/- additives 90 mL/hr at 02/29/12 1756   PRN Meds:.albuterol, diphenhydrAMINE, diphenhydrAMINE, naloxone, ondansetron (ZOFRAN) IV, ondansetron (ZOFRAN) IV, ondansetron (ZOFRAN) IV, potassium chloride, sodium chloride, sodium chloride  Labs:  CMP     Component Value Date/Time   NA 134* 02/29/2012 0500   K 3.7 02/29/2012 0500   CL 99 02/29/2012 0500   CO2 26 02/29/2012 0500   GLUCOSE 112* 02/29/2012 0500   BUN 10 02/29/2012 0500   CREATININE 0.53 02/29/2012 0500   CALCIUM 8.3* 02/29/2012 0500   PROT 5.4* 02/29/2012 0500   ALBUMIN 2.1* 02/29/2012 0500   AST 20 02/29/2012 0500   ALT 23 02/29/2012 0500   ALKPHOS 70 02/29/2012 0500   BILITOT 0.4 02/29/2012 0500   GFRNONAA >90 02/29/2012 0500   GFRAA >90 02/29/2012 0500    Magnesium  Date Value Range Status  02/29/2012 2.0  1.5 - 2.5 mg/dL Final    Phosphorus  Date Value Range Status  02/29/2012 3.6  2.3 - 4.6 mg/dL Final     Intake/Output Summary (Last 24 hours) at 03/01/12 1522 Last data filed at 03/01/12 1500  Gross per 24  hour  Intake   1150 ml  Output    875 ml  Net    275 ml    CBG (last 3)   Basename 03/01/12 0752 02/29/12 1604 02/29/12 0759  GLUCAP 129* 120* 125*    Weight Status: 78.3 kg (10/25) -- fluctuating  Estimated needs: 2000-2200 kcals, 115-125 gm protein   Nutrition Dx: Inadequate Oral Intake r/t inability to eat, s/p esophagogastrostomy as evidenced by NPO status, ongoing  New Goal: Intake to meet >90% of estimated needs, met.  Monitor: EN tolerance, TPN prescription, weight, labs, I/O's   Joaquin Courts, RD, LDN, CNSC Pager# [phone] After Hours Pager# [phone]

## 2012-03-01 NOTE — Progress Notes (Signed)
Pt ambulated x1 assist, with wheel chair, pt tolerated well 400 feet. Pt back in chair call bell in reach  Will continue to monitor patient . Keniesha Adderly, Randall An RN

## 2012-03-01 NOTE — Progress Notes (Signed)
Patient ID: [name], male   DOB: 04-18-1933, 78 y.o.   MRN: [id number] [facility] Progress Note:   7 Days Post-Op  Subjective: Mental status is clear.  Sitting up in chair  2017.  Wife present.   Objective: Vital signs in last 24 hours: Temp:  [98 F (36.7 C)-99.6 F (37.6 C)] 98.3 F (36.8 C) (10/25 0539) Pulse Rate:  [80-90] 85  (10/25 1046) Resp:  [16-26] 16  (10/25 0735) BP: (151-162)/(68-82) 158/70 mmHg (10/25 1046) SpO2:  [94 %-98 %] 96 % (10/25 0735) Weight:  [172 lb 11.2 oz (78.336 kg)] 172 lb 11.2 oz (78.336 kg) (10/25 0539)  Intake/Output from previous day: 10/24 0701 - 10/25 0700 In: 2858.4 [P.O.:720; TPN:2108.4] Out: 875 [Urine:875] Intake/Output this shift:    Physical Exam: Work of breathing is normal.  Had BM and more flatus this am.    Lab Results:  Results for orders placed during the hospital encounter of 02/15/12 (from the past 48 hour(s))  GLUCOSE, CAPILLARY     Status: Abnormal   Collection Time   02/28/12  3:26 PM      Component Value Range Comment   Glucose-Capillary 114 (*) 70 - 99 mg/dL   GLUCOSE, CAPILLARY     Status: Abnormal   Collection Time   02/28/12  7:48 PM      Component Value Range Comment   Glucose-Capillary 121 (*) 70 - 99 mg/dL   GLUCOSE, CAPILLARY     Status: Abnormal   Collection Time   02/28/12 11:56 PM      Component Value Range Comment   Glucose-Capillary 129 (*) 70 - 99 mg/dL   COMPREHENSIVE METABOLIC PANEL     Status: Abnormal   Collection Time   02/29/12  5:00 AM      Component Value Range Comment   Sodium 134 (*) 135 - 145 mEq/L    Potassium 3.7  3.5 - 5.1 mEq/L    Chloride 99  96 - 112 mEq/L    CO2 26  19 - 32 mEq/L    Glucose, Bld 112 (*) 70 - 99 mg/dL    BUN 10  6 - 23 mg/dL    Creatinine, Ser 4.09  0.50 - 1.35 mg/dL    Calcium 8.3 (*) 8.4 - 10.5 mg/dL    Total Protein 5.4 (*) 6.0 - 8.3 g/dL    Albumin 2.1 (*) 3.5 - 5.2 g/dL    AST 20  0 - 37 U/L    ALT 23  0 - 53 U/L    Alkaline  Phosphatase 70  39 - 117 U/L    Total Bilirubin 0.4  0.3 - 1.2 mg/dL    GFR calc non Af Amer >90  >90 mL/min    GFR calc Af Amer >90  >90 mL/min   MAGNESIUM     Status: Normal   Collection Time   02/29/12  5:00 AM      Component Value Range Comment   Magnesium 2.0  1.5 - 2.5 mg/dL   PHOSPHORUS     Status: Normal   Collection Time   02/29/12  5:00 AM      Component Value Range Comment   Phosphorus 3.6  2.3 - 4.6 mg/dL   CBC     Status: Abnormal   Collection Time   02/29/12  5:00 AM      Component Value Range Comment   WBC 7.3  4.0 - 10.5 K/uL    RBC 3.08 (*) 4.22 -  5.81 MIL/uL    Hemoglobin 8.6 (*) 13.0 - 17.0 g/dL    HCT 16.1 (*) 09.6 - 52.0 %    MCV 85.4  78.0 - 100.0 fL    MCH 27.9  26.0 - 34.0 pg    MCHC 32.7  30.0 - 36.0 g/dL    RDW 04.5  40.9 - 81.1 %    Platelets 225  150 - 400 K/uL   GLUCOSE, CAPILLARY     Status: Abnormal   Collection Time   02/29/12  7:59 AM      Component Value Range Comment   Glucose-Capillary 125 (*) 70 - 99 mg/dL   GLUCOSE, CAPILLARY     Status: Abnormal   Collection Time   02/29/12  4:04 PM      Component Value Range Comment   Glucose-Capillary 120 (*) 70 - 99 mg/dL   GLUCOSE, CAPILLARY     Status: Abnormal   Collection Time   03/01/12  7:52 AM      Component Value Range Comment   Glucose-Capillary 129 (*) 70 - 99 mg/dL    Comment 1 Notify RN      Comment 2 Documented in Chart       Radiology/Results: No results found.  Anti-infectives: Anti-infectives     Start     Dose/Rate Route Frequency Ordered Stop   02/23/12 1430   cefOXitin (MEFOXIN) 1 g in dextrose 5 % 50 mL IVPB        1 g 100 mL/hr over 30 Minutes Intravenous 4 times per day 02/23/12 1334 02/24/12 0644   02/23/12 1315   cefOXitin (MEFOXIN) 2 g in dextrose 5 % 50 mL IVPB        2 g 100 mL/hr over 30 Minutes Intravenous To Surgery 02/23/12 1305 02/23/12 1428   02/23/12 0600   cefOXitin (MEFOXIN) 2 g in dextrose 5 % 50 mL IVPB        2 g 100 mL/hr over 30 Minutes  Intravenous  Once 02/22/12 0904 02/23/12 0737          Assessment/Plan: Problem List: Patient Active Problem List  Diagnosis  . Acute upper GI bleed  . Gastric cancer  . S/P aortic valve replacement    Will begin Jevity per jejunostomy tube at 15/hr and maintain TNA for now.  May get gastrograffin swallow on Monday.   7 Days Post-Op    LOS: 15 days   Matt B. Daphine Deutscher, MD, [facility] [phone] beeper [phone]  03/01/2012 12:13 PM

## 2012-03-01 NOTE — Progress Notes (Signed)
PARENTERAL NUTRITION CONSULT NOTE - FOLLOW UP  Pharmacy Consult for TPN Indication: prolonged ileus  No Known Allergies  Patient Measurements: Height: 5\' 7"  (170.2 cm) Weight: 172 lb 11.2 oz (78.336 kg) (bed weight) IBW/kg (Calculated) : 66.1  Adjusted Body Weight: 70kg Usual Weight: 82kg  Vital Signs: Temp: 98.3 F (36.8 C) (10/25 0539) Temp src: Oral (10/25 0539) BP: 152/82 mmHg (10/25 0539) Pulse Rate: 90  (10/25 0539) Intake/Output from previous day: 10/24 0701 - 10/25 0700 In: 2858.4 [P.O.:720; TPN:2108.4] Out: 875 [Urine:875] Intake/Output from this shift:    Labs:  Basename 02/29/12 0500 02/28/12 0355  WBC 7.3 6.9  HGB 8.6* 9.0*  HCT 26.3* 27.5*  PLT 225 226  APTT -- --  INR -- --     Basename 02/29/12 0500 02/28/12 0355  NA 134* 134*  K 3.7 3.9  CL 99 99  CO2 26 29  GLUCOSE 112* 114*  BUN 10 10  CREATININE 0.53 0.53  LABCREA -- --  CREAT24HRUR -- --  CALCIUM 8.3* 8.4  MG 2.0 --  PHOS 3.6 --  PROT 5.4* --  ALBUMIN 2.1* --  AST 20 --  ALT 23 --  ALKPHOS 70 --  BILITOT 0.4 --  BILIDIR -- --  IBILI -- --  PREALBUMIN -- --  TRIG -- --  CHOLHDL -- --  CHOL -- --   Estimated Creatinine Clearance: 73.4 ml/min (by C-G formula based on Cr of 0.53).    Basename 03/01/12 0752 02/29/12 1604 02/29/12 0759  GLUCAP 129* 120* 125*    Medications:  Scheduled:     . enoxaparin  40 mg Subcutaneous Q24H  . fentaNYL   Intravenous Q4H  . metoprolol tartrate  25 mg Per J Tube BID  . pantoprazole (PROTONIX) IV  40 mg Intravenous Q12H  . prednisoLONE acetate  2 drop Right Eye QHS  . sodium chloride  10 mL Intravenous Q12H  . sodium chloride  10-40 mL Intracatheter Q12H   Infusions:     . dextrose 5 % and 0.45 % NaCl with KCl 20 mEq/L 20 mL/hr at 02/28/12 1300  . TPN (CLINIMIX) +/- additives 90 mL/hr at 02/28/12 1756   And  . fat emulsion 250 mL (02/28/12 1800)  . TPN (CLINIMIX) +/- additives 90 mL/hr at 02/29/12 1756    Insulin Requirements  in the past 24 hours:  zero  Current Nutrition:  Clinimix E 5/15 at 90 ml/hr, Lipids 20% MWF  Assessment: 78 y/o male patient admitted with esophageal mass, now POD#7 s/p gastrectomy and jejunostomy requiring TPN for prolonged ileus. Patient tolerating goal rate.  GI: ileus s/p gastrectomy/jejunostomy; small amt of flatus ;no n/v; + BS; tolerating TPN at goal rate. Esophageal mass/carcinoma, possibly start TF down jtube Endo: cbg<150 Lytes: last labs wnl; no labs today Renal: scr /output good Pulm: 96% on ra Cards: bp 152/82, lopressor down j-tube Hepatobil: prealbumin 8.8, below goal Neuro: no c/o ; fent pca ID:   afebrile Best Practices: lovenox for dvt px TPN Access: PICC TPN day#: 5    Nutritional Goals:  1650-1750 kCal, 105-115 grams of protein per day  Plan:  Continue Clinimix E 5/15 at goal rate of 6ml/hr. Goal rate provides 1747kcal and 108 protein daily based upon 3 times weekly lipid supplementation. Monitor cbg q8h and I/O status with twice weekly tpn labs. Lipid and MVI supplements MWF d/t national backorder.   Verlene Mayer, PharmD, BCPS Pager [phone] 03/01/2012,8:14 AM

## 2012-03-01 NOTE — Progress Notes (Signed)
Received order to start Jevity per J-Tube. Notified PA that pt has not a BM but has passed flatus throughout the day. PA stated that was ok to start Jevity regardless and to contact if pt not able to tolerate feeding. Will continue to monitor.

## 2012-03-02 LAB — GLUCOSE, CAPILLARY
Glucose-Capillary: 126 mg/dL — ABNORMAL HIGH (ref 70–99)
Glucose-Capillary: 118 mg/dL — ABNORMAL HIGH (ref 70–99)
Glucose-Capillary: 109 mg/dL — ABNORMAL HIGH (ref 70–99)

## 2012-03-02 LAB — BASIC METABOLIC PANEL
CO2: 26 mEq/L (ref 19–32)
Calcium: 8.5 mg/dL (ref 8.4–10.5)
Creatinine, Ser: 0.54 mg/dL (ref 0.50–1.35)
GFR calc Af Amer: >90 mL/min (ref >90)
GFR calc non Af Amer: >90 mL/min (ref >90)
Sodium: 135 mEq/L (ref 135–145)

## 2012-03-02 LAB — CBC
MCV: 84.8 fL (ref 78.0–100.0)
Platelets: 243 10*3/uL (ref 150–400)
RBC: 3.3 MIL/uL — ABNORMAL LOW (ref 4.22–5.81)
RDW: 14.4 % (ref 11.5–15.5)
WBC: 6.6 10*3/uL (ref 4.0–10.5)

## 2012-03-02 MED ORDER — CLINIMIX E/DEXTROSE (5/15) 5 % IV SOLN
INTRAVENOUS | Status: AC
  Administered 2012-03-02: 18:00:00 via INTRAVENOUS
  Filled 2012-03-02: qty 2200

## 2012-03-02 MED ORDER — METOPROLOL TARTRATE 25 MG/10 ML ORAL SUSPENSION
37.5000 mg | Freq: Two times a day (BID) | ORAL | Status: DC
  Administered 2012-03-02 – 2012-03-05 (×8): 37.5 mg via JEJUNOSTOMY
  Filled 2012-03-02 (×13): qty 15

## 2012-03-02 NOTE — Progress Notes (Addendum)
                   [address]            [place] [address]          [phone]     8 Days Post-Op  Procedure(s) (LRB): GASTRECTOMY (N/A) JEJUNOSTOMY (Left) Subjective: Passing flatus  Objective  Telemetry sinus rhythm  Temp:  [97.7 F (36.5 C)-98.5 F (36.9 C)] 98.4 F (36.9 C) (10/26 0445) Pulse Rate:  [84-97] 95  (10/26 0445) Resp:  [15-23] 20  (10/26 0445) BP: (152-162)/(70-82) 152/80 mmHg (10/26 0445) SpO2:  [95 %-96 %] 95 % (10/26 0445)   Intake/Output Summary (Last 24 hours) at 03/02/12 0817 Last data filed at 03/02/12 0300  Gross per 24 hour  Intake    955 ml  Output    800 ml  Net    155 ml       General appearance: alert, cooperative and no distress Heart: regular rate and rhythm Lungs: mildly dim in bases Abdomen: + BS, mild distension, mild incisional tenderness Extremities: pas in place Wound: incisions healing well  Lab Results:  Basename 03/02/12 0500 02/29/12 0500  NA 135 134*  K 3.9 3.7  CL 101 99  CO2 26 26  GLUCOSE 118* 112*  BUN 15 10  CREATININE 0.54 0.53  CALCIUM 8.5 8.3*  MG -- 2.0  PHOS -- 3.6    Basename 02/29/12 0500  AST 20  ALT 23  ALKPHOS 70  BILITOT 0.4  PROT 5.4*  ALBUMIN 2.1*   No results found for this basename: LIPASE:2,AMYLASE:2 in the last 72 hours  Basename 03/02/12 0500 02/29/12 0500  WBC 6.6 7.3  NEUTROABS -- --  HGB 9.1* 8.6*  HCT 28.0* 26.3*  MCV 84.8 85.4  PLT 243 225   No results found for this basename: CKTOTAL:4,CKMB:4,TROPONINI:4 in the last 72 hours No components found with this basename: POCBNP:3 No results found for this basename: DDIMER in the last 72 hours No results found for this basename: HGBA1C in the last 72 hours No results found for this basename: CHOL,HDL,LDLCALC,TRIG,CHOLHDL in the last 72 hours No results found for this basename: TSH,T4TOTAL,FREET3,T3FREE,THYROIDAB in the last 72 hours No results found for this basename:  VITAMINB12,FOLATE,FERRITIN,TIBC,IRON,RETICCTPCT in the last 72 hours  Medications: Scheduled    . enoxaparin  40 mg Subcutaneous Q24H  . feeding supplement (JEVITY 1.2 CAL)  1,000 mL Per Tube Q24H  . fentaNYL   Intravenous Q4H  . metoprolol tartrate  25 mg Per J Tube BID  . pantoprazole (PROTONIX) IV  40 mg Intravenous Q12H  . prednisoLONE acetate  2 drop Right Eye QHS  . sodium chloride  10 mL Intravenous Q12H  . sodium chloride  10-40 mL Intracatheter Q12H     Radiology/Studies:  No results found.  INR: Will add last result for INR, ABG once components are confirmed Will add last 4 CBG results once components are confirmed  Assessment/Plan: S/P Procedure(s) (LRB): GASTRECTOMY (N/A) JEJUNOSTOMY (Left)  1. Ileus mostly resolved with + Flatus/ active BS in all quadrants, mild distension- conts TNA/ jtube feedings for now, poss GG swallow monday 2 BP still elevated- increase Beta blocker, may need additional agent 3 push rehab as able    LOS: 16 days    [name],Nicholas E 10/26/20138:17 AM     Chart reviewed, patient examined, agree with above. Will plan on GG swallow Monday.

## 2012-03-02 NOTE — Progress Notes (Signed)
8 Days Post-Op  Subjective: Doing well. Lots of flatus. Started on trophic J tube feeds yesterday. Min abd pain. No n/v  Objective: Vital signs in last 24 hours: Temp:  [97.7 F (36.5 C)-98.5 F (36.9 C)] 98.4 F (36.9 C) (10/26 0445) Pulse Rate:  [84-97] 95  (10/26 0445) Resp:  [15-23] 20  (10/26 0445) BP: (152-162)/(70-82) 152/80 mmHg (10/26 0445) SpO2:  [95 %-96 %] 95 % (10/26 0445) Last BM Date:  (pre op)  Intake/Output from previous day: 10/25 0701 - 10/26 0700 In: 955 [I.V.:880] Out: 1000 [Urine:1000] Intake/Output this shift:    Alert, nad Soft, nt, nd. Incision c/d/i; j tube ok  Lab Results:   Basename 03/02/12 0500 02/29/12 0500  WBC 6.6 7.3  HGB 9.1* 8.6*  HCT 28.0* 26.3*  PLT 243 225   BMET  Basename 03/02/12 0500 02/29/12 0500  NA 135 134*  K 3.9 3.7  CL 101 99  CO2 26 26  GLUCOSE 118* 112*  BUN 15 10  CREATININE 0.54 0.53  CALCIUM 8.5 8.3*   PT/INR No results found for this basename: LABPROT:2,INR:2 in the last 72 hours ABG No results found for this basename: PHART:2,PCO2:2,PO2:2,HCO3:2 in the last 72 hours  Studies/Results: No results found.  Anti-infectives: Anti-infectives     Start     Dose/Rate Route Frequency Ordered Stop   02/23/12 1430   cefOXitin (MEFOXIN) 1 g in dextrose 5 % 50 mL IVPB        1 g 100 mL/hr over 30 Minutes Intravenous 4 times per day 02/23/12 1334 02/24/12 0644   02/23/12 1315   cefOXitin (MEFOXIN) 2 g in dextrose 5 % 50 mL IVPB        2 g 100 mL/hr over 30 Minutes Intravenous To Surgery 02/23/12 1305 02/23/12 1428   02/23/12 0600   cefOXitin (MEFOXIN) 2 g in dextrose 5 % 50 mL IVPB        2 g 100 mL/hr over 30 Minutes Intravenous  Once 02/22/12 0904 02/23/12 0737          Assessment/Plan: s/p Procedure(s) (LRB) with comments: GASTRECTOMY (N/A) - esophogastrectomy, proximal third stomach and distal esophagus JEJUNOSTOMY (Left)  Looks good Will adv J tube feeds to 25cc/hr. Will get nutrition to come  back by and leave new recs - Dr Daphine Deutscher used a different TF formula then dietary initially recommended; MD WILL ORDER ADVANCEMENT OF TF ONLY Swallow test on Monday Pt/ot/pulm toilet  Mary Sella. Andrey Campanile, MD, FACS General, Bariatric, & Minimally Invasive Surgery [facility], [place]    LOS: 16 days    Atilano Ina 03/02/2012

## 2012-03-02 NOTE — Progress Notes (Signed)
PARENTERAL NUTRITION CONSULT NOTE - FOLLOW UP  Pharmacy Consult for TPN Indication: prolonged ileus  No Known Allergies  Patient Measurements: Height: 5\' 7"  (170.2 cm) Weight: 172 lb 11.2 oz (78.336 kg) (bed weight) IBW/kg (Calculated) : 66.1  Adjusted Body Weight: 70kg Usual Weight: 82kg  Vital Signs: Temp: 98.4 F (36.9 C) (10/26 0445) Temp src: Oral (10/26 0445) BP: 152/80 mmHg (10/26 0445) Pulse Rate: 95  (10/26 0445) Intake/Output from previous day: 10/25 0701 - 10/26 0700 In: 955 [I.V.:880] Out: 1000 [Urine:1000] Intake/Output from this shift:    Labs:  Basename 03/02/12 0500 02/29/12 0500  WBC 6.6 7.3  HGB 9.1* 8.6*  HCT 28.0* 26.3*  PLT 243 225  APTT -- --  INR -- --     Basename 03/02/12 0500 02/29/12 0500  NA 135 134*  K 3.9 3.7  CL 101 99  CO2 26 26  GLUCOSE 118* 112*  BUN 15 10  CREATININE 0.54 0.53  LABCREA -- --  CREAT24HRUR -- --  CALCIUM 8.5 8.3*  MG -- 2.0  PHOS -- 3.6  PROT -- 5.4*  ALBUMIN -- 2.1*  AST -- 20  ALT -- 23  ALKPHOS -- 70  BILITOT -- 0.4  BILIDIR -- --  IBILI -- --  PREALBUMIN -- --  TRIG -- --  CHOLHDL -- --  CHOL -- --   Estimated Creatinine Clearance: 73.4 ml/min (by C-G formula based on Cr of 0.54).    Basename 03/02/12 0607 03/01/12 0752 02/29/12 1604  GLUCAP 126* 129* 120*    Medications:  Scheduled:     . enoxaparin  40 mg Subcutaneous Q24H  . feeding supplement (JEVITY 1.2 CAL)  1,000 mL Per Tube Q24H  . fentaNYL   Intravenous Q4H  . metoprolol tartrate  25 mg Per J Tube BID  . pantoprazole (PROTONIX) IV  40 mg Intravenous Q12H  . prednisoLONE acetate  2 drop Right Eye QHS  . sodium chloride  10 mL Intravenous Q12H  . sodium chloride  10-40 mL Intracatheter Q12H   Infusions:     . dextrose 5 % and 0.45 % NaCl with KCl 20 mEq/L 20 mL/hr at 02/28/12 1300  . TPN (CLINIMIX) +/- additives 90 mL/hr at 03/01/12 1800   And  . fat emulsion 250 mL (03/01/12 1800)  . TPN (CLINIMIX) +/- additives 90  mL/hr at 02/29/12 1756    Insulin Requirements in the past 24 hours:  none  Current Nutrition:  Clinimix E 5/15 at 90 ml/hr, Lipids 20% MWF  Assessment: 78 y/o male patient admitted with esophageal mass, now POD#8 s/p gastrectomy and jejunostomy requiring TPN for prolonged ileus. Patient tolerating goal rate.  Jevity 1.2 per J-tube at 15 mL/hr started 10/25.  GI: ileus s/p gastrectomy/jejunostomy; small amt of flatus ;no n/v; + BS; tolerating TPN at goal rate. Esophageal mass/carcinoma, Jevity started at 36mL/hr on 10/25 Endo: cbg<150 Lytes: K 3.9, Na 135 Renal: scr /output good Pulm: 95% on ra Cards: bp 152/80, lopressor down j-tube Hepatobil: prealbumin 8.8, below goal Neuro: no c/o ; fent pca ID:   [email or web address] Best Practices: lovenox for dvt px TPN Access: PICC TPN day#: 6    Nutritional Goals:  1650-1750 kCal, 105-115 grams of protein per day  Plan:  Continue Clinimix E 5/15 at goal rate of 66ml/hr. Goal rate provides 1747kcal and 108 protein daily based upon 3 times weekly lipid supplementation. Monitor cbg q8h and I/O status with twice weekly tpn labs. Lipid and MVI supplements MWF d/t national  backorder.  Monitor tube feed tolerance and decrease TPN accordingly.  Celedonio Miyamoto, PharmD, BCPS Clinical Pharmacist Pager [phone]  03/02/2012,7:29 AM

## 2012-03-02 NOTE — Progress Notes (Signed)
Nutrition Consult Note  Intervention:  TPN per pharmacy - noted they will decrease TPN as TF advances  If pt appropriate for TF to be primary source of nutrition and no leak found on gastrografin swallow, recommend increase current rate of Jevity 1.2 at 57ml/hr by 10 ml every 6 hours to goal rate of 70 ml/h with Prostat 30 ml BID to provide 2216 kcals, 123 gm protein, 1361 ml free water daily. If IVF d/c, recommend water flushes q4hr via jejunostomy tube.  RD to follow for nutrition care plan  Diet: NPO  TF: Jevity 1.2 via jejunostomy tube at 67ml/hr - provides 720 calories, 33g protein  TPN: Clinimix E 5/15 @ 90 ml/hr. Lipids (20% IVFE @ 10.4 ml/hr), multivitamins, and trace elements are provided 3 times weekly (MWF) due to national backorder. Provides 1747 kcal and 108 grams protein daily (based on weekly average). Meets 87% minimum estimated kcal and 94% minimum estimated protein needs.  Additional IVF with D5 1/2 NS @ 10-20 ml/hr.  - Follow up RD note completed yesterday. Met with pt who reports tolerating TF well. Pt denies any nausea/cramping. Pt reports having BM this morning. Wife states it is the first one pt has had since the 7th of October. Pt without any nutrition questions/concerns.   Levon Hedger MS, RD, LDN [phone] Pager

## 2012-03-03 LAB — GLUCOSE, CAPILLARY
Glucose-Capillary: 137 mg/dL — ABNORMAL HIGH (ref 70–99)
Glucose-Capillary: 131 mg/dL — ABNORMAL HIGH (ref 70–99)

## 2012-03-03 MED ORDER — JEVITY 1.2 CAL PO LIQD
1000.0000 mL | ORAL | Status: DC
  Administered 2012-03-03 – 2012-03-04 (×2): 1000 mL
  Administered 2012-03-05: 21:00:00
  Filled 2012-03-03 (×7): qty 1000

## 2012-03-03 MED ORDER — JEVITY 1.2 CAL PO LIQD
1000.0000 mL | ORAL | Status: DC
  Filled 2012-03-03 (×2): qty 1000

## 2012-03-03 MED ORDER — CLINIMIX E/DEXTROSE (5/15) 5 % IV SOLN
INTRAVENOUS | Status: DC
  Administered 2012-03-03: 18:00:00 via INTRAVENOUS
  Filled 2012-03-03: qty 2000

## 2012-03-03 NOTE — Progress Notes (Signed)
Patient did not want to walk this evening. Will continue to monitor.

## 2012-03-03 NOTE — Progress Notes (Signed)
Patient had five beats of VTach @0430 . Patient was in the bathroom having a bowel movement. Strip placed in chart. Will continue to monitor.

## 2012-03-03 NOTE — Progress Notes (Signed)
Patient ambulated using wheelchair and RN assist 500 ft. Tolerated well.

## 2012-03-03 NOTE — Progress Notes (Signed)
9 Days Post-Op  Subjective:  Looks OK having BM  Objective: Vital signs in last 24 hours: Temp:  [97.6 F (36.4 C)-98.4 F (36.9 C)] 97.6 F (36.4 C) (10/27 0435) Pulse Rate:  [76-88] 88  (10/27 0435) Resp:  [18-20] 18  (10/27 0435) BP: (150-162)/(68-79) 150/73 mmHg (10/27 0435) SpO2:  [96 %-98 %] 96 % (10/27 0435) Weight:  [162 lb 6.4 oz (73.664 kg)] 162 lb 6.4 oz (73.664 kg) (10/27 0435) Last BM Date: 03/03/12  Intake/Output from previous day: 10/26 0701 - 10/27 0700 In: -  Out: 1176 [Urine:1175; Stool:1] Intake/Output this shift:    Incision/Wound:CLEAN DRY INTACT   Lab Results:   Basename 03/02/12 0500  WBC 6.6  HGB 9.1*  HCT 28.0*  PLT 243   BMET  Basename 03/02/12 0500  NA 135  K 3.9  CL 101  CO2 26  GLUCOSE 118*  BUN 15  CREATININE 0.54  CALCIUM 8.5   PT/INR No results found for this basename: LABPROT:2,INR:2 in the last 72 hours ABG No results found for this basename: PHART:2,PCO2:2,PO2:2,HCO3:2 in the last 72 hours  Studies/Results: No results found.  Anti-infectives: Anti-infectives     Start     Dose/Rate Route Frequency Ordered Stop   02/23/12 1430   cefOXitin (MEFOXIN) 1 g in dextrose 5 % 50 mL IVPB        1 g 100 mL/hr over 30 Minutes Intravenous 4 times per day 02/23/12 1334 02/24/12 0644   02/23/12 1315   cefOXitin (MEFOXIN) 2 g in dextrose 5 % 50 mL IVPB        2 g 100 mL/hr over 30 Minutes Intravenous To Surgery 02/23/12 1305 02/23/12 1428   02/23/12 0600   cefOXitin (MEFOXIN) 2 g in dextrose 5 % 50 mL IVPB        2 g 100 mL/hr over 30 Minutes Intravenous  Once 02/22/12 0904 02/23/12 0737          Assessment/Plan: s/p Procedure(s) (LRB) with comments: GASTRECTOMY (N/A) - esophogastrectomy, proximal third stomach and distal esophagus JEJUNOSTOMY (Left) Increase TF to 50 cc/hr  LOS: 17 days    [name] A. 03/03/2012

## 2012-03-03 NOTE — Progress Notes (Signed)
PARENTERAL NUTRITION CONSULT NOTE - FOLLOW UP  Pharmacy Consult for TPN Indication: prolonged ileus  No Known Allergies  Patient Measurements: Height: 5\' 7"  (170.2 cm) Weight: 162 lb 6.4 oz (73.664 kg) IBW/kg (Calculated) : 66.1  Adjusted Body Weight: 70kg Usual Weight: 82kg  Vital Signs: Temp: 97.6 F (36.4 C) (10/27 0435) Temp src: Oral (10/27 0435) BP: 150/73 mmHg (10/27 0435) Pulse Rate: 88  (10/27 0435) Intake/Output from previous day: 10/26 0701 - 10/27 0700 In: -  Out: 1176 [Urine:1175; Stool:1] Intake/Output from this shift:    Labs:  Basename 03/02/12 0500  WBC 6.6  HGB 9.1*  HCT 28.0*  PLT 243  APTT --  INR --     Basename 03/02/12 0500  NA 135  K 3.9  CL 101  CO2 26  GLUCOSE 118*  BUN 15  CREATININE 0.54  LABCREA --  CREAT24HRUR --  CALCIUM 8.5  MG --  PHOS --  PROT --  ALBUMIN --  AST --  ALT --  ALKPHOS --  BILITOT --  BILIDIR --  IBILI --  PREALBUMIN --  TRIG --  CHOLHDL --  CHOL --   Estimated Creatinine Clearance: 73.4 ml/min (by C-G formula based on Cr of 0.54).    Basename 03/03/12 0615 03/03/12 0125 03/02/12 2043  GLUCAP 131* 137* 109*    Medications:  Scheduled:     . enoxaparin  40 mg Subcutaneous Q24H  . feeding supplement (JEVITY 1.2 CAL)  1,000 mL Per Tube Q24H  . fentaNYL   Intravenous Q4H  . metoprolol tartrate  37.5 mg Per J Tube BID  . pantoprazole (PROTONIX) IV  40 mg Intravenous Q12H  . prednisoLONE acetate  2 drop Right Eye QHS  . sodium chloride  10 mL Intravenous Q12H  . sodium chloride  10-40 mL Intracatheter Q12H  . DISCONTD: feeding supplement (JEVITY 1.2 CAL)  1,000 mL Per Tube Q24H  . DISCONTD: metoprolol tartrate  25 mg Per J Tube BID   Infusions:     . dextrose 5 % and 0.45 % NaCl with KCl 20 mEq/L 20 mL/hr at 02/28/12 1300  . TPN (CLINIMIX) +/- additives 90 mL/hr at 03/01/12 1800   And  . fat emulsion 250 mL (03/01/12 1800)  . TPN (CLINIMIX) +/- additives 90 mL/hr at 03/02/12 1739     Insulin Requirements in the past 24 hours:  none  Current Nutrition:  Clinimix E 5/15 at 90 ml/hr, Lipids 20% MWF  Assessment: 78 y/o male patient admitted with esophageal mass, now POD#8 s/p gastrectomy and jejunostomy requiring TPN for prolonged ileus. Patient tolerating goal rate.  Jevity 1.2 per J-tube at 15 mL/hr started 10/25.  This was increased to 63mL/hr on 10/26.  Discussed with RN - he is tolerating this and having bowel movements.  GI:  tolerating TPN at goal rate. Esophageal mass/carcinoma, Jevity started at 88mL/hr on 10/25.  Rate increased 10/26 to 31mL/hr. Endo: cbg<150 Lytes: K 3.9, Na 135 Renal: scr /output good Pulm: 95% on ra Cards: bp 152/80, lopressor down j-tube Hepatobil: prealbumin 8.8, below goal Neuro: no c/o ; fent pca ID:   afebrile Best Practices: lovenox for dvt px TPN Access: PICC TPN day#: 7    Nutritional Goals:  1650-1750 kCal, 105-115 grams of protein per day  Plan:  Decrease Clinimix E 5/15 at goal rate of 70ml/hr.  Monitor cbg q8h and I/O status with twice weekly tpn labs. Lipid and MVI supplements MWF d/t national backorder.  Monitor tube feed tolerance  and decrease TPN accordingly.  Rate changed in tube feeds per MD.  Celedonio Miyamoto, PharmD, BCPS Clinical Pharmacist Pager [phone]  03/03/2012,8:15 AM

## 2012-03-03 NOTE — Progress Notes (Addendum)
                   [name]            [place] [address]          [phone]     9 Days Post-Op  Procedure(s) (LRB): GASTRECTOMY (N/A) JEJUNOSTOMY (Left) Subjective: Passing some flatus  Objective  Telemetry sinus, PAC's, PVC's  Temp:  [97.6 F (36.4 C)-98.4 F (36.9 C)] 97.6 F (36.4 C) (10/27 0435) Pulse Rate:  [76-88] 88  (10/27 0435) Resp:  [18-20] 18  (10/27 0435) BP: (150-162)/(68-79) 150/73 mmHg (10/27 0435) SpO2:  [96 %-98 %] 96 % (10/27 0435) Weight:  [162 lb 6.4 oz (73.664 kg)] 162 lb 6.4 oz (73.664 kg) (10/27 0435)   Intake/Output Summary (Last 24 hours) at 03/03/12 1043 Last data filed at 03/03/12 0436  Gross per 24 hour  Intake      0 ml  Output    976 ml  Net   -976 ml       General appearance: alert, cooperative and no distress Heart: regular rate and rhythm Lungs: clear to auscultation bilaterally Abdomen: +BS, soft, nontender Extremities: warm, well perfused Wound: incisions healing well  Lab Results:  Basename 03/02/12 0500  NA 135  K 3.9  CL 101  CO2 26  GLUCOSE 118*  BUN 15  CREATININE 0.54  CALCIUM 8.5  MG --  PHOS --   No results found for this basename: AST:2,ALT:2,ALKPHOS:2,BILITOT:2,PROT:2,ALBUMIN:2 in the last 72 hours No results found for this basename: LIPASE:2,AMYLASE:2 in the last 72 hours  Basename 03/02/12 0500  WBC 6.6  NEUTROABS --  HGB 9.1*  HCT 28.0*  MCV 84.8  PLT 243   No results found for this basename: CKTOTAL:4,CKMB:4,TROPONINI:4 in the last 72 hours No components found with this basename: POCBNP:3 No results found for this basename: DDIMER in the last 72 hours No results found for this basename: HGBA1C in the last 72 hours No results found for this basename: CHOL,HDL,LDLCALC,TRIG,CHOLHDL in the last 72 hours No results found for this basename: TSH,T4TOTAL,FREET3,T3FREE,THYROIDAB in the last 72 hours No results found for this basename:  VITAMINB12,FOLATE,FERRITIN,TIBC,IRON,RETICCTPCT in the last 72 hours  Medications: Scheduled    . enoxaparin  40 mg Subcutaneous Q24H  . fentaNYL   Intravenous Q4H  . metoprolol tartrate  37.5 mg Per J Tube BID  . pantoprazole (PROTONIX) IV  40 mg Intravenous Q12H  . prednisoLONE acetate  2 drop Right Eye QHS  . sodium chloride  10 mL Intravenous Q12H  . sodium chloride  10-40 mL Intracatheter Q12H  . DISCONTD: feeding supplement (JEVITY 1.2 CAL)  1,000 mL Per Tube Q24H  . DISCONTD: feeding supplement (JEVITY 1.2 CAL)  1,000 mL Per Tube Q24H  . DISCONTD: feeding supplement (JEVITY 1.2 CAL)  1,000 mL Per Tube Q24H     Radiology/Studies:  No results found.  INR: Will add last result for INR, ABG once components are confirmed Will add last 4 CBG results once components are confirmed  Assessment/Plan: S/P Procedure(s) (LRB): GASTRECTOMY (N/A) JEJUNOSTOMY (Left)  1. GG swallow in am, conts current rx, will prob need additional BP agent when taking po     LOS: 17 days    [name],[name] 10/27/201310:43 AM    Agree with above. Bowel function has returned. Will get GG swallow in am.

## 2012-03-04 ENCOUNTER — Telehealth: Payer: Self-pay | Admitting: Oncology

## 2012-03-04 ENCOUNTER — Inpatient Hospital Stay (HOSPITAL_COMMUNITY): Payer: Medicare HMO

## 2012-03-04 LAB — CBC
HCT: 27.9 % — ABNORMAL LOW (ref 39.0–52.0)
Hemoglobin: 8.8 g/dL — ABNORMAL LOW (ref 13.0–17.0)
MCH: 26.6 pg (ref 26.0–34.0)
MCHC: 31.5 g/dL (ref 30.0–36.0)
MCV: 84.3 fL (ref 78.0–100.0)
Platelets: 264 10*3/uL (ref 150–400)
RBC: 3.31 MIL/uL — ABNORMAL LOW (ref 4.22–5.81)
RDW: 14.4 % (ref 11.5–15.5)
WBC: 7.1 10*3/uL (ref 4.0–10.5)

## 2012-03-04 LAB — COMPREHENSIVE METABOLIC PANEL
ALT: 50 U/L (ref 0–53)
AST: 21 U/L (ref 0–37)
Albumin: 2.1 g/dL — ABNORMAL LOW (ref 3.5–5.2)
Alkaline Phosphatase: 72 U/L (ref 39–117)
BUN: 14 mg/dL (ref 6–23)
CO2: 27 mEq/L (ref 19–32)
Calcium: 8.2 mg/dL — ABNORMAL LOW (ref 8.4–10.5)
Chloride: 100 mEq/L (ref 96–112)
Creatinine, Ser: 0.52 mg/dL (ref 0.50–1.35)
GFR calc Af Amer: >90 mL/min (ref >90)
GFR calc non Af Amer: >90 mL/min (ref >90)
Glucose, Bld: 123 mg/dL — ABNORMAL HIGH (ref 70–99)
Potassium: 3.9 mEq/L (ref 3.5–5.1)
Sodium: 133 mEq/L — ABNORMAL LOW (ref 135–145)
Total Bilirubin: 0.2 mg/dL — ABNORMAL LOW (ref 0.3–1.2)
Total Protein: 5.3 g/dL — ABNORMAL LOW (ref 6.0–8.3)

## 2012-03-04 LAB — GLUCOSE, CAPILLARY: Glucose-Capillary: 103 mg/dL — ABNORMAL HIGH (ref 70–99)

## 2012-03-04 LAB — DIFFERENTIAL
Basophils Absolute: 0 10*3/uL (ref 0.0–0.1)
Basophils Relative: 1 % (ref 0–1)
Eosinophils Absolute: 0.9 10*3/uL — ABNORMAL HIGH (ref 0.0–0.7)
Eosinophils Relative: 13 % — ABNORMAL HIGH (ref 0–5)
Lymphocytes Relative: 11 % — ABNORMAL LOW (ref 12–46)
Lymphs Abs: 0.8 10*3/uL (ref 0.7–4.0)
Monocytes Absolute: 0.8 10*3/uL (ref 0.1–1.0)
Monocytes Relative: 11 % (ref 3–12)
Neutro Abs: 4.5 10*3/uL (ref 1.7–7.7)
Neutrophils Relative %: 64 % (ref 43–77)

## 2012-03-04 LAB — PHOSPHORUS: Phosphorus: 3.3 mg/dL (ref 2.3–4.6)

## 2012-03-04 LAB — TRIGLYCERIDES: Triglycerides: 85 mg/dL (ref <150)

## 2012-03-04 LAB — CHOLESTEROL, TOTAL: Cholesterol: 107 mg/dL (ref 0–200)

## 2012-03-04 LAB — MAGNESIUM: Magnesium: 2 mg/dL (ref 1.5–2.5)

## 2012-03-04 LAB — PREALBUMIN: Prealbumin: 17.2 mg/dL — ABNORMAL LOW (ref 17.0–34.0)

## 2012-03-04 MED ORDER — IOHEXOL 300 MG/ML  SOLN
100.0000 mL | Freq: Once | INTRAMUSCULAR | Status: AC | PRN
  Administered 2012-03-04: 100 mL via ORAL

## 2012-03-04 NOTE — Telephone Encounter (Signed)
LVOM for pt to return call in re apt.

## 2012-03-04 NOTE — Progress Notes (Addendum)
10 Days Post-Op Procedure(s) (LRB): GASTRECTOMY (N/A) JEJUNOSTOMY (Left) Subjective:  [name] is without complaints this morning.  He is having bowel movements, but states they are mainly diarrhea  Objective: Vital signs in last 24 hours: Temp:  [98.5 F (36.9 C)-99.4 F (37.4 C)] 99.4 F (37.4 C) (10/28 0423) Pulse Rate:  [72-80] 80  (10/28 0423) Cardiac Rhythm:  [-] Normal sinus rhythm (10/27 1939) Resp:  [18-19] 19  (10/28 0423) BP: (143-161)/(67-78) 143/78 mmHg (10/28 0423) SpO2:  [96 %-97 %] 96 % (10/28 0423) Weight:  [162 lb 0.6 oz (73.5 kg)] 162 lb 0.6 oz (73.5 kg) (10/28 0423)  Intake/Output from previous day: 10/27 0701 - 10/28 0700 In: -  Out: 150 [Urine:150]   General appearance: alert, cooperative and no distress Heart: regular rate and rhythm Lungs: clear to auscultation bilaterally Abdomen: soft, distended, normal bowel sounds Wound: clean and dry, staples in place  Lab Results:  Basename 03/04/12 0500 03/02/12 0500  WBC 7.1 6.6  HGB 8.8* 9.1*  HCT 27.9* 28.0*  PLT 264 243   BMET:  Basename 03/04/12 0500 03/02/12 0500  NA 133* 135  K 3.9 3.9  CL 100 101  CO2 27 26  GLUCOSE 123* 118*  BUN 14 15  CREATININE 0.52 0.54  CALCIUM 8.2* 8.5    PT/INR: No results found for this basename: LABPROT,INR in the last 72 hours ABG    Component Value Date/Time   PHART 7.419 02/24/2012 0444   HCO3 25.6* 02/24/2012 0444   TCO2 27 02/24/2012 0444   O2SAT 97.0 02/24/2012 0444   CBG (last 3)   Basename 03/04/12 0012 03/03/12 1608 03/03/12 0615  GLUCAP 116* 121* 131*    Assessment/Plan: S/P Procedure(s) (LRB): GASTRECTOMY (N/A) JEJUNOSTOMY (Left)  1. Gastrograffin swallow this morning 2. CV- NSR, mild hypertension will add additional agent as diet advances 3. Continue Tube feeds for now   LOS: 18 days    BARRETT, ERIN 03/04/2012   Swallow done today, will stop TNA and continue with tube feeding, slowly advance po diet  Dg Esophagus  W/water Sol Cm  03/04/2012  *RADIOLOGY REPORT*  Clinical Data: The patient underwent recent gastrectomy, partial, for A gastroesophageal junction tumor.  This is for postop evaluation.  ESOPHOGRAM/BARIUM SWALLOW  Technique:  Single contrast examination was performed using water soluble contrast, specifically 100 ml of Omnipaque-300 mixed with water, orally.  Fluoroscopy time:  1.13 minutes.  Comparison:  None.  Findings:  There is no evidence of contrast extravasation.  There is some mucosal irregularity of the distal esophagus likely from postoperative edema and some spasm.  There is a bulge from the region of the gastric cardia posteriorly and to the left, and another protrusion of contrast along the upper aspect of the remaining gastric body, also to the left.  The gastric outlet is patent although emptying of contrast into the duodenum was sluggish, likely from a postoperative gastroparesis.  Some stomach peristalsis was noted during exam.  The duodenum is normal in configuration and widely patent.  IMPRESSION: No evidence of a postoperative and anastomotic leak.  Mild gastroparesis with sluggish emptying of contrast through the gastric outlet.   Original Report Authenticated By: Domenic Moras, M.D.     I have seen and examined [name] and agree with the above assessment  and plan.  Delight Ovens MD Beeper [phone] Office [phone] 03/04/2012 12:34 PM

## 2012-03-04 NOTE — Progress Notes (Addendum)
Pt ambulated approximately 1200 ft on RA, pushing a wheelchair, pt had steady gait, slow pace.  Pt tolerated ambulation well, back to bed without incidence.[name], Shayann Garbutt P

## 2012-03-04 NOTE — Progress Notes (Signed)
Patient ID: [name], male   DOB: December 04, 1932, 78 y.o.   MRN: [id number] [facility] Progress Note:   10 Days Post-Op  Subjective: Mental status is clear Objective: Vital signs in last 24 hours: Temp:  [97.9 F (36.6 C)-99.4 F (37.4 C)] 97.9 F (36.6 C) (10/28 1456) Pulse Rate:  [77-80] 77  (10/28 1456) Resp:  [18-19] 18  (10/28 1456) BP: (143-158)/(63-78) 151/63 mmHg (10/28 1456) SpO2:  [96 %-98 %] 98 % (10/28 1456) Weight:  [162 lb 0.6 oz (73.5 kg)] 162 lb 0.6 oz (73.5 kg) (10/28 0423)  Intake/Output from previous day: 10/27 0701 - 10/28 0700 In: -  Out: 350 [Urine:350] Intake/Output this shift: Total I/O In: 480 [P.O.:480] Out: -   Physical Exam: Work of breathing is normal.  Sitting up drinking OK.  No complaints  Lab Results:  Results for orders placed during the hospital encounter of 02/15/12 (from the past 48 hour(s))  GLUCOSE, CAPILLARY     Status: Abnormal   Collection Time   03/02/12  4:17 PM      Component Value Range Comment   Glucose-Capillary 118 (*) 70 - 99 mg/dL    Comment 1 Documented in Chart      Comment 2 Notify RN     GLUCOSE, CAPILLARY     Status: Abnormal   Collection Time   03/02/12  8:43 PM      Component Value Range Comment   Glucose-Capillary 109 (*) 70 - 99 mg/dL    Comment 1 Documented in Chart      Comment 2 Notify RN     GLUCOSE, CAPILLARY     Status: Abnormal   Collection Time   03/03/12  1:25 AM      Component Value Range Comment   Glucose-Capillary 137 (*) 70 - 99 mg/dL   GLUCOSE, CAPILLARY     Status: Abnormal   Collection Time   03/03/12  6:15 AM      Component Value Range Comment   Glucose-Capillary 131 (*) 70 - 99 mg/dL    Comment 1 Documented in Chart      Comment 2 Notify RN     GLUCOSE, CAPILLARY     Status: Abnormal   Collection Time   03/03/12  4:08 PM      Component Value Range Comment   Glucose-Capillary 121 (*) 70 - 99 mg/dL    Comment 1 Notify RN      Comment 2 Documented in Chart       GLUCOSE, CAPILLARY     Status: Abnormal   Collection Time   03/04/12 12:12 AM      Component Value Range Comment   Glucose-Capillary 116 (*) 70 - 99 mg/dL    Comment 1 Documented in Chart      Comment 2 Notify RN     COMPREHENSIVE METABOLIC PANEL     Status: Abnormal   Collection Time   03/04/12  5:00 AM      Component Value Range Comment   Sodium 133 (*) 135 - 145 mEq/L    Potassium 3.9  3.5 - 5.1 mEq/L    Chloride 100  96 - 112 mEq/L    CO2 27  19 - 32 mEq/L    Glucose, Bld 123 (*) 70 - 99 mg/dL    BUN 14  6 - 23 mg/dL    Creatinine, Ser 6.44  0.50 - 1.35 mg/dL    Calcium 8.2 (*) 8.4 - 10.5 mg/dL    Total  Protein 5.3 (*) 6.0 - 8.3 g/dL    Albumin 2.1 (*) 3.5 - 5.2 g/dL    AST 21  0 - 37 U/L    ALT 50  0 - 53 U/L    Alkaline Phosphatase 72  39 - 117 U/L    Total Bilirubin 0.2 (*) 0.3 - 1.2 mg/dL    GFR calc non Af Amer >90  >90 mL/min    GFR calc Af Amer >90  >90 mL/min   MAGNESIUM     Status: Normal   Collection Time   03/04/12  5:00 AM      Component Value Range Comment   Magnesium 2.0  1.5 - 2.5 mg/dL   PHOSPHORUS     Status: Normal   Collection Time   03/04/12  5:00 AM      Component Value Range Comment   Phosphorus 3.3  2.3 - 4.6 mg/dL   CBC     Status: Abnormal   Collection Time   03/04/12  5:00 AM      Component Value Range Comment   WBC 7.1  4.0 - 10.5 K/uL    RBC 3.31 (*) 4.22 - 5.81 MIL/uL    Hemoglobin 8.8 (*) 13.0 - 17.0 g/dL    HCT 40.9 (*) 81.1 - 52.0 %    MCV 84.3  78.0 - 100.0 fL    MCH 26.6  26.0 - 34.0 pg    MCHC 31.5  30.0 - 36.0 g/dL    RDW 91.4  78.2 - 95.6 %    Platelets 264  150 - 400 K/uL   DIFFERENTIAL     Status: Abnormal   Collection Time   03/04/12  5:00 AM      Component Value Range Comment   Neutrophils Relative 64  43 - 77 %    Neutro Abs 4.5  1.7 - 7.7 K/uL    Lymphocytes Relative 11 (*) 12 - 46 %    Lymphs Abs 0.8  0.7 - 4.0 K/uL    Monocytes Relative 11  3 - 12 %    Monocytes Absolute 0.8  0.1 - 1.0 K/uL    Eosinophils  Relative 13 (*) 0 - 5 %    Eosinophils Absolute 0.9 (*) 0.0 - 0.7 K/uL    Basophils Relative 1  0 - 1 %    Basophils Absolute 0.0  0.0 - 0.1 K/uL   PREALBUMIN     Status: Abnormal   Collection Time   03/04/12  5:00 AM      Component Value Range Comment   Prealbumin 17.2 (*) 17.0 - 34.0 mg/dL   CHOLESTEROL, TOTAL     Status: Normal   Collection Time   03/04/12  5:00 AM      Component Value Range Comment   Cholesterol 107  0 - 200 mg/dL   TRIGLYCERIDES     Status: Normal   Collection Time   03/04/12  5:00 AM      Component Value Range Comment   Triglycerides 85  <150 mg/dL   GLUCOSE, CAPILLARY     Status: Abnormal   Collection Time   03/04/12  8:04 AM      Component Value Range Comment   Glucose-Capillary 122 (*) 70 - 99 mg/dL    Comment 1 Notify RN       Radiology/Results: Dg Esophagus W/water Sol Cm  03/04/2012  *RADIOLOGY REPORT*  Clinical Data: The patient underwent recent gastrectomy, partial, for A gastroesophageal junction tumor.  This is  for postop evaluation.  ESOPHOGRAM/BARIUM SWALLOW  Technique:  Single contrast examination was performed using water soluble contrast, specifically 100 ml of Omnipaque-300 mixed with water, orally.  Fluoroscopy time:  1.13 minutes.  Comparison:  None.  Findings:  There is no evidence of contrast extravasation.  There is some mucosal irregularity of the distal esophagus likely from postoperative edema and some spasm.  There is a bulge from the region of the gastric cardia posteriorly and to the left, and another protrusion of contrast along the upper aspect of the remaining gastric body, also to the left.  The gastric outlet is patent although emptying of contrast into the duodenum was sluggish, likely from a postoperative gastroparesis.  Some stomach peristalsis was noted during exam.  The duodenum is normal in configuration and widely patent.  IMPRESSION: No evidence of a postoperative and anastomotic leak.  Mild gastroparesis with sluggish  emptying of contrast through the gastric outlet.   Original Report Authenticated By: Domenic Moras, M.D.     Anti-infectives: Anti-infectives     Start     Dose/Rate Route Frequency Ordered Stop   02/23/12 1430   cefOXitin (MEFOXIN) 1 g in dextrose 5 % 50 mL IVPB        1 g 100 mL/hr over 30 Minutes Intravenous 4 times per day 02/23/12 1334 02/24/12 0644   02/23/12 1315   cefOXitin (MEFOXIN) 2 g in dextrose 5 % 50 mL IVPB        2 g 100 mL/hr over 30 Minutes Intravenous To Surgery 02/23/12 1305 02/23/12 1428   02/23/12 0600   cefOXitin (MEFOXIN) 2 g in dextrose 5 % 50 mL IVPB        2 g 100 mL/hr over 30 Minutes Intravenous  Once 02/22/12 0904 02/23/12 0737          Assessment/Plan: Problem List: Patient Active Problem List  Diagnosis  . Acute upper GI bleed  . Gastric cancer  . S/P aortic valve replacement    Gastrograffin swallow OK.  Clear liquids begun.  10 Days Post-Op    LOS: 18 days   Matt B. Daphine Deutscher, MD, [facility] [phone] beeper [phone]  03/04/2012 3:42 PM

## 2012-03-05 LAB — CBC
HCT: 28 % — ABNORMAL LOW (ref 39.0–52.0)
Hemoglobin: 8.8 g/dL — ABNORMAL LOW (ref 13.0–17.0)
MCH: 26.7 pg (ref 26.0–34.0)
MCHC: 31.4 g/dL (ref 30.0–36.0)
MCV: 84.8 fL (ref 78.0–100.0)
Platelets: 250 10*3/uL (ref 150–400)
RBC: 3.3 MIL/uL — ABNORMAL LOW (ref 4.22–5.81)
RDW: 14.5 % (ref 11.5–15.5)
WBC: 6.7 10*3/uL (ref 4.0–10.5)

## 2012-03-05 LAB — BASIC METABOLIC PANEL
BUN: 12 mg/dL (ref 6–23)
CO2: 28 mEq/L (ref 19–32)
Calcium: 8.3 mg/dL — ABNORMAL LOW (ref 8.4–10.5)
Chloride: 104 mEq/L (ref 96–112)
Creatinine, Ser: 0.55 mg/dL (ref 0.50–1.35)
GFR calc Af Amer: >90 mL/min (ref >90)
GFR calc non Af Amer: >90 mL/min (ref >90)
Glucose, Bld: 109 mg/dL — ABNORMAL HIGH (ref 70–99)
Potassium: 4.1 mEq/L (ref 3.5–5.1)
Sodium: 139 mEq/L (ref 135–145)

## 2012-03-05 LAB — GLUCOSE, CAPILLARY: Glucose-Capillary: 131 mg/dL — ABNORMAL HIGH (ref 70–99)

## 2012-03-05 NOTE — Progress Notes (Signed)
Pt ambulated approximately 1000 ft on RA pushing a wheelchair, with steady gait and moderate pace. Pt tolerated ambulation well, back to chair afterwards without incidence. Will continue to monitor.

## 2012-03-05 NOTE — Progress Notes (Addendum)
                    [address]            [place] [address]          [phone]     11 Days Post-Op Procedure(s) (LRB): GASTRECTOMY (N/A) JEJUNOSTOMY (Left)  Subjective: Feels well.  Tolerating liquids without problem.  Still having some loose stools. No nausea, dysphagia, or pain.   Objective: Vital signs in last 24 hours: Patient Vitals for the past 24 hrs:  BP Temp Temp src Pulse Resp SpO2  03/05/12 0500 145/66 mmHg 97.8 F (36.6 C) Oral 74  18  93 %  03/04/12 2011 153/65 mmHg 98.4 F (36.9 C) Oral 84  18  98 %  03/04/12 1456 151/63 mmHg 97.9 F (36.6 C) Oral 77  18  98 %  03/04/12 1128 158/72 mmHg - - - - -   Current Weight  03/04/12 162 lb 0.6 oz (73.5 kg)     Intake/Output from previous day: 10/28 0701 - 10/29 0700 In: 480 [P.O.:480] Out: 775 [Urine:775]    PHYSICAL EXAM:  Heart: RRR Lungs: Clear Wound: Clean and dry Abdomen: soft, minimally distended, +BS    Lab Results: CBC: Basename 03/05/12 0500 03/04/12 0500  WBC 6.7 7.1  HGB 8.8* 8.8*  HCT 28.0* 27.9*  PLT 250 264   BMET:  Basename 03/05/12 0500 03/04/12 0500  NA 139 133*  K 4.1 3.9  CL 104 100  CO2 28 27  GLUCOSE 109* 123*  BUN 12 14  CREATININE 0.55 0.52  CALCIUM 8.3* 8.2*    PT/INR: No results found for this basename: LABPROT,INR in the last 72 hours    Assessment/Plan: S/P Procedure(s) (LRB): GASTRECTOMY (N/A) JEJUNOSTOMY (Left) GI- ileus resolved.  Tolerating clears.  Advance diet slowly as tolerated.  Continue TFs via J tube. HTN- BPs stable. May need to add another agent if they remain somewhat elevated. Ambulate, pulm toilet. Labs stable this am.   LOS: 19 days    COLLINS,GINA H 03/05/2012   tolerating clear liquids, will advance Home 2-3 days if tolerating diet I have seen and examined Erich Montane and agree with the above assessment  and plan.  Delight Ovens MD Beeper [phone] Office [phone] 03/05/2012 4:35 PM

## 2012-03-05 NOTE — Progress Notes (Signed)
Patient did not want to complete an evening walk. Will continue to monitor. 

## 2012-03-05 NOTE — Progress Notes (Signed)
Pt ambulated approximately 1000 ft on RA pushing a wheelchair, with steady gait and moderate pace.  Pt tolerated ambulation well, back to chair afterwards without incidence.  Will continue to monitor. White, Lukah Goswami P

## 2012-03-05 NOTE — Progress Notes (Signed)
Patient ID: [name], male   DOB: [birth year], 78 y.o.   MRN: [id number] [facility] Surgery Progress Note:   11 Days Post-Op  Subjective: Mental status is clear.  Seated in chair with no complaints.   Objective: Vital signs in last 24 hours: Temp:  [97.8 F (36.6 C)-99.2 F (37.3 C)] 97.9 F (36.6 C) (10/29 1341) Pulse Rate:  [69-84] 69  (10/29 1341) Resp:  [18] 18  (10/29 1341) BP: (145-165)/(65-76) 150/71 mmHg (10/29 1341) SpO2:  [93 %-99 %] 99 % (10/29 1341)  Intake/Output from previous day: 10/28 0701 - 10/29 0700 In: 480 [P.O.:480] Out: 775 [Urine:775] Intake/Output this shift: Total I/O In: 600 [P.O.:600] Out: 1876 [Urine:1875; Stool:1]  Physical Exam: Work of breathing is normal.  Incisions bland.  Doing well with liquids.  Hopeful discharge later this week.   Lab Results:  Results for orders placed during the hospital encounter of 02/15/12 (from the past 48 hour(s))  GLUCOSE, CAPILLARY     Status: Abnormal   Collection Time   03/04/12 12:12 AM      Component Value Range Comment   Glucose-Capillary 116 (*) 70 - 99 mg/dL    Comment 1 Documented in Chart      Comment 2 Notify RN     COMPREHENSIVE METABOLIC PANEL     Status: Abnormal   Collection Time   03/04/12  5:00 AM      Component Value Range Comment   Sodium 133 (*) 135 - 145 mEq/L    Potassium 3.9  3.5 - 5.1 mEq/L    Chloride 100  96 - 112 mEq/L    CO2 27  19 - 32 mEq/L    Glucose, Bld 123 (*) 70 - 99 mg/dL    BUN 14  6 - 23 mg/dL    Creatinine, Ser 1.30  0.50 - 1.35 mg/dL    Calcium 8.2 (*) 8.4 - 10.5 mg/dL    Total Protein 5.3 (*) 6.0 - 8.3 g/dL    Albumin 2.1 (*) 3.5 - 5.2 g/dL    AST 21  0 - 37 U/L    ALT 50  0 - 53 U/L    Alkaline Phosphatase 72  39 - 117 U/L    Total Bilirubin 0.2 (*) 0.3 - 1.2 mg/dL    GFR calc non Af Amer >90  >90 mL/min    GFR calc Af Amer >90  >90 mL/min   MAGNESIUM     Status: Normal   Collection Time   03/04/12  5:00 AM      Component Value Range Comment   Magnesium 2.0  1.5 - 2.5 mg/dL   PHOSPHORUS     Status: Normal   Collection Time   03/04/12  5:00 AM      Component Value Range Comment   Phosphorus 3.3  2.3 - 4.6 mg/dL   CBC     Status: Abnormal   Collection Time   03/04/12  5:00 AM      Component Value Range Comment   WBC 7.1  4.0 - 10.5 K/uL    RBC 3.31 (*) 4.22 - 5.81 MIL/uL    Hemoglobin 8.8 (*) 13.0 - 17.0 g/dL    HCT 86.5 (*) 78.4 - 52.0 %    MCV 84.3  78.0 - 100.0 fL    MCH 26.6  26.0 - 34.0 pg    MCHC 31.5  30.0 - 36.0 g/dL    RDW 69.6  29.5 - 28.4 %    Platelets  264  150 - 400 K/uL   DIFFERENTIAL     Status: Abnormal   Collection Time   03/04/12  5:00 AM      Component Value Range Comment   Neutrophils Relative 64  43 - 77 %    Neutro Abs 4.5  1.7 - 7.7 K/uL    Lymphocytes Relative 11 (*) 12 - 46 %    Lymphs Abs 0.8  0.7 - 4.0 K/uL    Monocytes Relative 11  3 - 12 %    Monocytes Absolute 0.8  0.1 - 1.0 K/uL    Eosinophils Relative 13 (*) 0 - 5 %    Eosinophils Absolute 0.9 (*) 0.0 - 0.7 K/uL    Basophils Relative 1  0 - 1 %    Basophils Absolute 0.0  0.0 - 0.1 K/uL   PREALBUMIN     Status: Abnormal   Collection Time   03/04/12  5:00 AM      Component Value Range Comment   Prealbumin 17.2 (*) 17.0 - 34.0 mg/dL   CHOLESTEROL, TOTAL     Status: Normal   Collection Time   03/04/12  5:00 AM      Component Value Range Comment   Cholesterol 107  0 - 200 mg/dL   TRIGLYCERIDES     Status: Normal   Collection Time   03/04/12  5:00 AM      Component Value Range Comment   Triglycerides 85  <150 mg/dL   GLUCOSE, CAPILLARY     Status: Abnormal   Collection Time   03/04/12  8:04 AM      Component Value Range Comment   Glucose-Capillary 122 (*) 70 - 99 mg/dL    Comment 1 Notify RN     GLUCOSE, CAPILLARY     Status: Abnormal   Collection Time   03/04/12  4:17 PM      Component Value Range Comment   Glucose-Capillary 103 (*) 70 - 99 mg/dL    Comment 1 Notify RN     GLUCOSE, CAPILLARY     Status: Abnormal    Collection Time   03/04/12 11:58 PM      Component Value Range Comment   Glucose-Capillary 103 (*) 70 - 99 mg/dL   BASIC METABOLIC PANEL     Status: Abnormal   Collection Time   03/05/12  5:00 AM      Component Value Range Comment   Sodium 139  135 - 145 mEq/L    Potassium 4.1  3.5 - 5.1 mEq/L    Chloride 104  96 - 112 mEq/L    CO2 28  19 - 32 mEq/L    Glucose, Bld 109 (*) 70 - 99 mg/dL    BUN 12  6 - 23 mg/dL    Creatinine, Ser 1.61  0.50 - 1.35 mg/dL    Calcium 8.3 (*) 8.4 - 10.5 mg/dL    GFR calc non Af Amer >90  >90 mL/min    GFR calc Af Amer >90  >90 mL/min   CBC     Status: Abnormal   Collection Time   03/05/12  5:00 AM      Component Value Range Comment   WBC 6.7  4.0 - 10.5 K/uL    RBC 3.30 (*) 4.22 - 5.81 MIL/uL    Hemoglobin 8.8 (*) 13.0 - 17.0 g/dL    HCT 09.6 (*) 04.5 - 52.0 %    MCV 84.8  78.0 - 100.0 fL    MCH  26.7  26.0 - 34.0 pg    MCHC 31.4  30.0 - 36.0 g/dL    RDW 16.1  09.6 - 04.5 %    Platelets 250  150 - 400 K/uL   GLUCOSE, CAPILLARY     Status: Abnormal   Collection Time   03/05/12  8:12 AM      Component Value Range Comment   Glucose-Capillary 131 (*) 70 - 99 mg/dL    Comment 1 Documented in Chart      Comment 2 Notify RN     GLUCOSE, CAPILLARY     Status: Normal   Collection Time   03/05/12  4:23 PM      Component Value Range Comment   Glucose-Capillary 96  70 - 99 mg/dL    Comment 1 Documented in Chart      Comment 2 Notify RN       Radiology/Results: Dg Esophagus W/water Sol Cm  03/04/2012  *RADIOLOGY REPORT*  Clinical Data: The patient underwent recent gastrectomy, partial, for A gastroesophageal junction tumor.  This is for postop evaluation.  ESOPHOGRAM/BARIUM SWALLOW  Technique:  Single contrast examination was performed using water soluble contrast, specifically 100 ml of Omnipaque-300 mixed with water, orally.  Fluoroscopy time:  1.13 minutes.  Comparison:  None.  Findings:  There is no evidence of contrast extravasation.  There is  some mucosal irregularity of the distal esophagus likely from postoperative edema and some spasm.  There is a bulge from the region of the gastric cardia posteriorly and to the left, and another protrusion of contrast along the upper aspect of the remaining gastric body, also to the left.  The gastric outlet is patent although emptying of contrast into the duodenum was sluggish, likely from a postoperative gastroparesis.  Some stomach peristalsis was noted during exam.  The duodenum is normal in configuration and widely patent.  IMPRESSION: No evidence of a postoperative and anastomotic leak.  Mild gastroparesis with sluggish emptying of contrast through the gastric outlet.   Original Report Authenticated By: Domenic Moras, M.D.     Anti-infectives: Anti-infectives     Start     Dose/Rate Route Frequency Ordered Stop   02/23/12 1430   cefOXitin (MEFOXIN) 1 g in dextrose 5 % 50 mL IVPB        1 g 100 mL/hr over 30 Minutes Intravenous 4 times per day 02/23/12 1334 02/24/12 0644   02/23/12 1315   cefOXitin (MEFOXIN) 2 g in dextrose 5 % 50 mL IVPB        2 g 100 mL/hr over 30 Minutes Intravenous To Surgery 02/23/12 1305 02/23/12 1428   02/23/12 0600   cefOXitin (MEFOXIN) 2 g in dextrose 5 % 50 mL IVPB        2 g 100 mL/hr over 30 Minutes Intravenous  Once 02/22/12 0904 02/23/12 0737          Assessment/Plan: Problem List: Patient Active Problem List  Diagnosis  . Acute upper GI bleed  . Gastric cancer  . S/P aortic valve replacement    Will discontinue staples.  Doing well thus far.  11 Days Post-Op    LOS: 19 days   Matt B. Daphine Deutscher, MD, [facility] [phone] beeper [phone]  03/05/2012 6:26 PM

## 2012-03-06 LAB — GLUCOSE, CAPILLARY: Glucose-Capillary: 115 mg/dL — ABNORMAL HIGH (ref 70–99)

## 2012-03-06 MED ORDER — OXYCODONE-ACETAMINOPHEN 5-325 MG PO TABS: 1.0000 | ORAL_TABLET | ORAL | Status: DC | PRN

## 2012-03-06 MED ORDER — CARVEDILOL 25 MG PO TABS
25.0000 mg | ORAL_TABLET | Freq: Two times a day (BID) | ORAL | Status: DC
  Administered 2012-03-06: 25 mg via ORAL
  Filled 2012-03-06 (×4): qty 1

## 2012-03-06 MED ORDER — PANTOPRAZOLE SODIUM 40 MG PO TBEC
40.0000 mg | DELAYED_RELEASE_TABLET | Freq: Two times a day (BID) | ORAL | Status: DC
  Administered 2012-03-06 – 2012-03-07 (×3): 40 mg via ORAL
  Filled 2012-03-06 (×3): qty 1

## 2012-03-06 MED ORDER — PANTOPRAZOLE SODIUM 40 MG PO TBEC: 40.0000 mg | DELAYED_RELEASE_TABLET | Freq: Two times a day (BID) | ORAL | Status: AC

## 2012-03-06 MED ORDER — TRAMADOL HCL 50 MG PO TABS
50.0000 mg | ORAL_TABLET | Freq: Four times a day (QID) | ORAL | Status: DC | PRN
  Administered 2012-03-07: 100 mg via ORAL
  Filled 2012-03-06: qty 2

## 2012-03-06 MED ORDER — TRAMADOL HCL 50 MG PO TABS: 50.0000 mg | ORAL_TABLET | Freq: Four times a day (QID) | ORAL | Status: DC | PRN

## 2012-03-06 NOTE — Discharge Summary (Addendum)
[address]            [name] [address]          [phone]         Discharge Summary  Name: [name] DOB: [birth year] 78 y.o. 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 Anemia   . Hypertension   . COPD (chronic obstructive pulmonary disease)   . Gastric cancer 02/15/2012       Procedures:  Upper endoscopic ultrasound with FNA -02/16/2012  Visceral angiogram with embolization of celiac,left gastric, and epiploic arteries- 02/17/2012  Laparoscopic staging and mobilization of the foregut endoscopically, open distal esophageal proximal gastric resection, esophagogastroenterostomy, feeding jejunostomy- 02/23/2012      HPI:  The patient is a 78 y.o. male who presented in transfer from [facility] for evaluation of an esophageal mass. The patient was in his usual state of health until one week prior to admission. At that time, he developed dark tarry stools. He felt this was related to eating turnip greens earlier in the day, so he disregarded it. He was having dinner at a friend's house on Monday when he developed diarrhea and vomiting of acute onset. He noted that once again his stool was quite dark, and the vomited material appeared bright red and bloody. He had some crampy abdominal pain at the time. His GI symptoms resolved with rest at home, but he still felt weak. The following evening, he went in to work as usual. While there, he began to feel increasingly weak and fatigued, and his boss noted he appeared pale, so he was sent home. He continued to have dark stools, and  presented to his primary care office the following day for evaluation. He was noted to be anemic on labs and was sent to the emergency department at [facility] for possible admission.   On arrival at the hospital, the patient developed hematemesis and abdominal pain. He was immediately admitted to the ICU and transfused 2 units of packed red blood cells for hemoglobin of 6.8. CT scan showed a regular liver contour, with vascular congestion thought to be related to cirrhosis. A gastroenterology consult was obtained and the patient was seen by Dr. Charm Barges. He underwent an urgent EGD on 02/14/2012 which showed a 4 cm mass at the gastroesophageal junction with a visible bleeding vessel. This was injected with epinephrine and bleeding was controlled. It was GI's opinion that the patient should be transferred to [facility] for a full thoracic surgery workup for the esophageal mass, and Dr. Tyrone Sage agreed to accept him in transfer.      Hospital Course:  The patient was admitted to [facility] on 02/15/2012. A gastroenterology consult was obtained and the patient was seen by Dr. Elnoria Zaeem.  He underwent an upper endoscopic ultrasound on 02/16/2012, which showed a large 3 cm polypoid mass at the EG junction.  The area was biopsied, as well as fine needle aspirates of several lymph nodes.    The patient initially did well following the procedure and his diet was slowly  advanced.  However, on 02/17/2012, he developed acute onset hematemesis of large quantity requiring transfer to the ICU and emergent transfusion. He underwent urgent angiogram by interventional radiology with embolization of the celiac, left gastric and gastroepiploic arteries. He stabilized and had no further bleeding. A PET scan was performed which showed only low level activity in the area of the esophagus, with sigmoid diverticulosis, an enlarged right thyroid lobe, and nodular regions of hypermetabolic activity anteriorly in the right upper lobe and  medially in the lingula. These areas were thought to represent an atypical infectious bronchiolitis.   He remained clinically stable, and it was felt that he should proceed with esophageal resection. All risks, benefits and alternatives of surgery were explained in detail, and the patient agreed to proceed. The patient was taken to the operating room and underwent the above procedure by Dr. Tyrone Sage and Dr. Daphine Deutscher.    The postoperative course was notable for an early ileus, which was treated conservatively.  His bowel function slowly returned, and at present, he is having regular loose stools.  He was initially started on TNA for nutritional support, and once his ileus resolved, this was discontinued, and he was started on feeds via his jejunostomy tube.    He has progressed slowly, but steadily.  He underwent a gastrograffin swallow on 03/04/2012, and this showed no anastomotic leak.  He was started on clear liquids, and his diet has been advanced as tolerated.  His po intake has been good, and his J-tube feeds will be discontinued at the time of discharge home. He has been hypertensive, and was started on IV Lopressor.  Once he began taking po's, his home medications were slowly restarted.  He has been ambulating without difficulty.  His blood counts have remained stable. He had some early confusion, but his mental status quickly returned to baseline. Final pathology revealed adenocarcinoma with signet ring differentiation (T3, N1). His skin staples have been removed. We anticipate discharge home in the next 24 hours provided no acute changes occur.     Recent vital signs:  Filed Vitals:   03/06/12 0530  BP: 152/71  Pulse: 82  Temp: 97.9 F (36.6 C)  Resp: 16    Recent laboratory studies:  CBC: Basename 03/05/12 0500 03/04/12 0500  WBC 6.7 7.1  HGB 8.8* 8.8*  HCT 28.0* 27.9*  PLT 250 264   BMET:  Basename 03/05/12 0500 03/04/12 0500  NA 139 133*  K 4.1 3.9  CL 104 100  CO2 28 27    GLUCOSE 109* 123*  BUN 12 14  CREATININE 0.55 0.52  CALCIUM 8.3* 8.2*    PT/INR: No results found for this basename: LABPROT,INR in the last 72 hours   Discharge Medications:     Medication List     As of 03/06/2012  8:41 AM    STOP taking these medications         Fish Oil 1000 MG Caps      niacin 500 MG tablet   Commonly known as: SLO-NIACIN      TAKE these medications         amLODipine-benazepril 10-40 MG per capsule   Commonly known as: LOTREL   Take 1 capsule by mouth daily.      Aspirin 81 MG EC tablet   Take 81 mg by mouth daily.      carvedilol 25 MG tablet   Commonly known as: COREG   Take 25 mg by mouth 2 (two) times daily with a  meal.      hydrochlorothiazide 25 MG tablet   Commonly known as: HYDRODIURIL   Take 25 mg by mouth daily.      pantoprazole 40 MG tablet   Commonly known as: PROTONIX   Take 1 tablet (40 mg total) by mouth 2 (two) times daily.      pravastatin 10 MG tablet   Commonly known as: PRAVACHOL   Take 10 mg by mouth at bedtime.      prednisoLONE acetate 1 % ophthalmic suspension   Commonly known as: PRED FORTE   Place 2-3 drops into the right eye at bedtime.      traMADol 50 MG tablet   Commonly known as: ULTRAM   Take 1-2 tablets (50-100 mg total) by mouth every 6 (six) hours as needed for pain.      vitamin C 500 MG tablet   Commonly known as: ASCORBIC ACID   Take 500 mg by mouth 2 (two) times daily.         Discharge Instructions:  The patient is to refrain from driving, heavy lifting or strenuous activity.  May clean incisions with soap and water.  May continue a soft diet. J-tube should be flushed every 8 hours with 30 ml normal saline.     Follow Up:      Follow-up Information    Follow up with GERHARDT,EDWARD B, MD. On 03/14/2012. (Office will contact you with appointment time)    Contact information:   [address] [employer] [address] [place] [address] [phone]       Follow up with Luretha Murphy  B, MD. Schedule an appointment as soon as possible for a visit in 3 weeks.   Contact information:   [address] [place] [address] [phone]       Follow up with Thornton Papas, MD. (As directed)    Contact information:   [address] [place] [address] [phone]           Sung Parodi H 03/06/2012, 8:41 AM

## 2012-03-06 NOTE — Plan of Care (Signed)
Problem: Food- and Nutrition-Related Knowledge Deficit (NB-1.1) Goal: Nutrition education Formal process to instruct or train a patient/client in a skill or to impart knowledge to help patients/clients voluntarily manage or modify food choices and eating behavior to maintain or improve health.  Outcome: Completed/Met Date Met:  03/06/12  RD consult for soft diet education per wife request. Spoke with Coral Ceo, PA-C via telephone. Reviewed guidelines and recommendations. Handouts provided from [employer]. Questions answered. RD available should patient and/or wife have further questions and/or concerns.  Kirkland Hun, RD, LDN Pager #: [phone] After-Hours Pager #: [phone]

## 2012-03-06 NOTE — Progress Notes (Signed)
                    [address]            [place] [address]          [phone]     12 Days Post-Op Procedure(s) (LRB): GASTRECTOMY (N/A) JEJUNOSTOMY (Left)  Subjective: Tolerating soft diet without problem.  No other complaints.   Objective: Vital signs in last 24 hours: Patient Vitals for the past 24 hrs:  BP Temp Temp src Pulse Resp SpO2  03/06/12 0530 152/71 mmHg 97.9 F (36.6 C) Oral 82  16  95 %  03/05/12 1944 155/73 mmHg 98.6 F (37 C) Oral 79  18  99 %  03/05/12 1341 150/71 mmHg 97.9 F (36.6 C) Oral 69  18  99 %  03/05/12 0939 165/76 mmHg 99.2 F (37.3 C) Oral 75  18  94 %   Current Weight  03/04/12 162 lb 0.6 oz (73.5 kg)     Intake/Output from previous day: 10/29 0701 - 10/30 0700 In: 600 [P.O.:600] Out: 2501 [Urine:2500; Stool:1]    PHYSICAL EXAM:  Heart: RRR Lungs: Clear Wound: Clean and dry Abdomen: soft, NT/ND, +BS    Lab Results: CBC: Basename 03/05/12 0500 03/04/12 0500  WBC 6.7 7.1  HGB 8.8* 8.8*  HCT 28.0* 27.9*  PLT 250 264   BMET:  Basename 03/05/12 0500 03/04/12 0500  NA 139 133*  K 4.1 3.9  CL 104 100  CO2 28 27  GLUCOSE 109* 123*  BUN 12 14  CREATININE 0.55 0.52  CALCIUM 8.3* 8.2*    PT/INR: No results found for this basename: LABPROT,INR in the last 72 hours    Assessment/Plan: S/P Procedure(s) (LRB): GASTRECTOMY (N/A) JEJUNOSTOMY (Left) GI- ileus resolved, tolerating diet.  Discussed with Dr. Tyrone Sage and Dr. Daphine Deutscher- will plan d/c home in am off tube feeds.   Will change meds to po and restart home BP meds. Continue current care.   LOS: 20 days    Oletta Buehring H 03/06/2012

## 2012-03-06 NOTE — Progress Notes (Addendum)
Pt ambulated approximately 1000 ft on RA pushing a wheelchair, pt gait was steady and pace was moderate. Pt tolerated ambulation well. Pt back to bed without incidence, will continue to monitor. White, Charita Lindenberger P

## 2012-03-06 NOTE — Progress Notes (Signed)
Patient ID: [name], male   DOB: 05-Feb-1933, 78 y.o.   MRN: [id number] [facility] Surgery Progress Note:   12 Days Post-Op  Subjective: Mental status is clear.  Sitting up in chair and reading.  Seen with Dr. Tyrone Sage Objective: Vital signs in last 24 hours: Temp:  [97.9 F (36.6 C)-99.2 F (37.3 C)] 97.9 F (36.6 C) (10/30 0530) Pulse Rate:  [69-82] 82  (10/30 0530) Resp:  [16-18] 16  (10/30 0530) BP: (150-165)/(71-76) 152/71 mmHg (10/30 0530) SpO2:  [94 %-99 %] 95 % (10/30 0530)  Intake/Output from previous day: 10/29 0701 - 10/30 0700 In: 600 [P.O.:600] Out: 2501 [Urine:2500; Stool:1] Intake/Output this shift:    Physical Exam: Work of breathing is normal.  Staples out.  Taking full liquids OK  Lab Results:  Results for orders placed during the hospital encounter of 02/15/12 (from the past 48 hour(s))  GLUCOSE, CAPILLARY     Status: Abnormal   Collection Time   03/04/12  4:17 PM      Component Value Range Comment   Glucose-Capillary 103 (*) 70 - 99 mg/dL    Comment 1 Notify RN     GLUCOSE, CAPILLARY     Status: Abnormal   Collection Time   03/04/12 11:58 PM      Component Value Range Comment   Glucose-Capillary 103 (*) 70 - 99 mg/dL   BASIC METABOLIC PANEL     Status: Abnormal   Collection Time   03/05/12  5:00 AM      Component Value Range Comment   Sodium 139  135 - 145 mEq/L    Potassium 4.1  3.5 - 5.1 mEq/L    Chloride 104  96 - 112 mEq/L    CO2 28  19 - 32 mEq/L    Glucose, Bld 109 (*) 70 - 99 mg/dL    BUN 12  6 - 23 mg/dL    Creatinine, Ser 6.57  0.50 - 1.35 mg/dL    Calcium 8.3 (*) 8.4 - 10.5 mg/dL    GFR calc non Af Amer >90  >90 mL/min    GFR calc Af Amer >90  >90 mL/min   CBC     Status: Abnormal   Collection Time   03/05/12  5:00 AM      Component Value Range Comment   WBC 6.7  4.0 - 10.5 K/uL    RBC 3.30 (*) 4.22 - 5.81 MIL/uL    Hemoglobin 8.8 (*) 13.0 - 17.0 g/dL    HCT 84.6 (*) 96.2 - 52.0 %    MCV 84.8  78.0 - 100.0 fL    MCH 26.7  26.0 - 34.0 pg    MCHC 31.4  30.0 - 36.0 g/dL    RDW 95.2  84.1 - 32.4 %    Platelets 250  150 - 400 K/uL   GLUCOSE, CAPILLARY     Status: Abnormal   Collection Time   03/05/12  8:12 AM      Component Value Range Comment   Glucose-Capillary 131 (*) 70 - 99 mg/dL    Comment 1 Documented in Chart      Comment 2 Notify RN     GLUCOSE, CAPILLARY     Status: Normal   Collection Time   03/05/12  4:23 PM      Component Value Range Comment   Glucose-Capillary 96  70 - 99 mg/dL    Comment 1 Documented in Chart      Comment 2 Notify RN  GLUCOSE, CAPILLARY     Status: Normal   Collection Time   03/05/12 11:59 PM      Component Value Range Comment   Glucose-Capillary 98  70 - 99 mg/dL   GLUCOSE, CAPILLARY     Status: Abnormal   Collection Time   03/06/12  7:48 AM      Component Value Range Comment   Glucose-Capillary 148 (*) 70 - 99 mg/dL    Comment 1 Documented in Chart      Comment 2 Notify RN       Radiology/Results: Dg Esophagus W/water Sol Cm  03/04/2012  *RADIOLOGY REPORT*  Clinical Data: The patient underwent recent gastrectomy, partial, for A gastroesophageal junction tumor.  This is for postop evaluation.  ESOPHOGRAM/BARIUM SWALLOW  Technique:  Single contrast examination was performed using water soluble contrast, specifically 100 ml of Omnipaque-300 mixed with water, orally.  Fluoroscopy time:  1.13 minutes.  Comparison:  None.  Findings:  There is no evidence of contrast extravasation.  There is some mucosal irregularity of the distal esophagus likely from postoperative edema and some spasm.  There is a bulge from the region of the gastric cardia posteriorly and to the left, and another protrusion of contrast along the upper aspect of the remaining gastric body, also to the left.  The gastric outlet is patent although emptying of contrast into the duodenum was sluggish, likely from a postoperative gastroparesis.  Some stomach peristalsis was noted during exam.  The  duodenum is normal in configuration and widely patent.  IMPRESSION: No evidence of a postoperative and anastomotic leak.  Mild gastroparesis with sluggish emptying of contrast through the gastric outlet.   Original Report Authenticated By: Domenic Moras, M.D.     Anti-infectives: Anti-infectives     Start     Dose/Rate Route Frequency Ordered Stop   02/23/12 1430   cefOXitin (MEFOXIN) 1 g in dextrose 5 % 50 mL IVPB        1 g 100 mL/hr over 30 Minutes Intravenous 4 times per day 02/23/12 1334 02/24/12 0644   02/23/12 1315   cefOXitin (MEFOXIN) 2 g in dextrose 5 % 50 mL IVPB        2 g 100 mL/hr over 30 Minutes Intravenous To Surgery 02/23/12 1305 02/23/12 1428   02/23/12 0600   cefOXitin (MEFOXIN) 2 g in dextrose 5 % 50 mL IVPB        2 g 100 mL/hr over 30 Minutes Intravenous  Once 02/22/12 0904 02/23/12 0737          Assessment/Plan: Problem List: Patient Active Problem List  Diagnosis  . Acute upper GI bleed  . Gastric cancer  . S/P aortic valve replacement    Plan discharge tomorrow on pureed diet.  Followup with me in three weeks 12 Days Post-Op    LOS: 20 days   Matt B. Daphine Deutscher, MD, [facility] [phone] beeper [phone]  03/06/2012 8:29 AM

## 2012-03-07 LAB — GLUCOSE, CAPILLARY: Glucose-Capillary: 109 mg/dL — ABNORMAL HIGH (ref 70–99)

## 2012-03-07 NOTE — Progress Notes (Signed)
Discharged to home with family office visits in place teaching done  

## 2012-03-07 NOTE — Progress Notes (Signed)
Per MD order, PICC line removed. Cath intact at 38cm. Vaseline pressure gauze to site, pressure held x 5min. No bleeding to site. Pt instructed to keep dressing CDI x 24 hours. Avoid heavy lifting, pushing or pulling x 24 hours,  If bleeding occurs hold pressure, if bleeding does not stop contact MD or go to the ED. Pt does not have any questions. Marinell Igarashi M  

## 2012-03-07 NOTE — Progress Notes (Addendum)
                    [address]            [place] [address]          [phone]     13 Days Post-Op Procedure(s) (LRB): GASTRECTOMY (N/A) JEJUNOSTOMY (Left)  Subjective: Feels well, no complaints.  Appetite improving, tolerating diet.   Objective: Vital signs in last 24 hours: Patient Vitals for the past 24 hrs:  BP Temp Temp src Pulse Resp SpO2 Weight  03/07/12 0500 140/88 mmHg 98.4 F (36.9 C) Oral 78  17  96 % 174 lb 1.6 oz (78.971 kg)  03/06/12 2146 140/64 mmHg 98.6 F (37 C) Oral 77  18  97 % -  03/06/12 1358 153/71 mmHg 98 F (36.7 C) Oral 84  18  98 % -   Current Weight  03/07/12 174 lb 1.6 oz (78.971 kg)     Intake/Output from previous day: 10/30 0701 - 10/31 0700 In: 480 [P.O.:480] Out: 926 [Urine:925; Stool:1]    PHYSICAL EXAM:  Heart: RRR Lungs: Clear Wound: Clean and dry Abdomen: soft, NT/ND, +BS    Lab Results: CBC: Basename 03/05/12 0500  WBC 6.7  HGB 8.8*  HCT 28.0*  PLT 250   BMET:  Basename 03/05/12 0500  NA 139  K 4.1  CL 104  CO2 28  GLUCOSE 109*  BUN 12  CREATININE 0.55  CALCIUM 8.3*    PT/INR: No results found for this basename: LABPROT,INR in the last 72 hours    Assessment/Plan: S/P Procedure(s) (LRB): GASTRECTOMY (N/A) JEJUNOSTOMY (Left) Doing well. Plan d/c home today.  Instructions reviewed with patient.    LOS: 21 days    COLLINS,GINA H 03/07/2012   home today I have seen and examined Erich Montane and agree with the above assessment  and plan.  Delight Ovens MD Beeper [phone] Office [phone] 03/07/2012 9:06 AM

## 2012-03-07 NOTE — Progress Notes (Signed)
Patient ID: [name], male   DOB: 1932/09/12, 78 y.o.   MRN: [id number] [facility] Progress Note:   13 Days Post-Op  Subjective: Mental status is clear Objective: Vital signs in last 24 hours: Temp:  [98 F (36.7 C)-98.6 F (37 C)] 98.4 F (36.9 C) (10/31 0500) Pulse Rate:  [77-84] 78  (10/31 0500) Resp:  [17-18] 17  (10/31 0500) BP: (140-153)/(64-88) 140/88 mmHg (10/31 0500) SpO2:  [96 %-98 %] 96 % (10/31 0500) Weight:  [174 lb 1.6 oz (78.971 kg)] 174 lb 1.6 oz (78.971 kg) (10/31 0500)  Intake/Output from previous day: 10/30 0701 - 10/31 0700 In: 480 [P.O.:480] Out: 926 [Urine:925; Stool:1] Intake/Output this shift:    Physical Exam: Work of breathing is normal.  No complaints  Lab Results:  Results for orders placed during the hospital encounter of 02/15/12 (from the past 48 hour(s))  GLUCOSE, CAPILLARY     Status: Normal   Collection Time   03/05/12  4:23 PM      Component Value Range Comment   Glucose-Capillary 96  70 - 99 mg/dL    Comment 1 Documented in Chart      Comment 2 Notify RN     GLUCOSE, CAPILLARY     Status: Normal   Collection Time   03/05/12 11:59 PM      Component Value Range Comment   Glucose-Capillary 98  70 - 99 mg/dL   GLUCOSE, CAPILLARY     Status: Abnormal   Collection Time   03/06/12  7:48 AM      Component Value Range Comment   Glucose-Capillary 148 (*) 70 - 99 mg/dL    Comment 1 Documented in Chart      Comment 2 Notify RN     GLUCOSE, CAPILLARY     Status: Normal   Collection Time   03/06/12  4:21 PM      Component Value Range Comment   Glucose-Capillary 89  70 - 99 mg/dL    Comment 1 Documented in Chart      Comment 2 Notify RN     GLUCOSE, CAPILLARY     Status: Abnormal   Collection Time   03/06/12  9:47 PM      Component Value Range Comment   Glucose-Capillary 115 (*) 70 - 99 mg/dL   GLUCOSE, CAPILLARY     Status: Abnormal   Collection Time   03/07/12  6:07 AM      Component Value Range Comment   Glucose-Capillary 109 (*) 70 - 99 mg/dL     Radiology/Results: No results found.  Anti-infectives: Anti-infectives     Start     Dose/Rate Route Frequency Ordered Stop   02/23/12 1430   cefOXitin (MEFOXIN) 1 g in dextrose 5 % 50 mL IVPB        1 g 100 mL/hr over 30 Minutes Intravenous 4 times per day 02/23/12 1334 02/24/12 0644   02/23/12 1315   cefOXitin (MEFOXIN) 2 g in dextrose 5 % 50 mL IVPB        2 g 100 mL/hr over 30 Minutes Intravenous To Surgery 02/23/12 1305 02/23/12 1428   02/23/12 0600   cefOXitin (MEFOXIN) 2 g in dextrose 5 % 50 mL IVPB        2 g 100 mL/hr over 30 Minutes Intravenous  Once 02/22/12 0904 02/23/12 0737          Assessment/Plan: Problem List: Patient Active Problem List  Diagnosis  . Acute upper GI bleed  .  Gastric cancer  . S/P aortic valve replacement    OK for discharge later today.  Followup with me in 3-4 weeks. 13 Days Post-Op    LOS: 21 days   Matt B. Daphine Deutscher, MD, [facility] [phone] beeper [phone]  03/07/2012 8:59 AM

## 2012-03-07 NOTE — Care Management Note (Addendum)
    Page 1 of 2   03/07/2012     3:49:31 PM   CARE MANAGEMENT NOTE 03/07/2012  Patient:  [name], [name]   Account Number:  [id number]  Date Initiated:  02/19/2012  Documentation initiated by:  Alvira Philips Assessment:   78 yr-old male adm with dx of GI Bleed; lives with spouse, independent PTA     Action/Plan:   MET WITH PT AND SPOUSE TO DISCUSS DC PLANS. WIFE TO PROVIDE CARE AT DC.  REFERRAL TO [facility] FOR HH NEEDS, PER PT CHOICE. START OF CARE 24H -48H POST DC DATE.   Anticipated DC Date:  03/07/2012   Anticipated DC Plan:  HOME W HOME HEALTH SERVICES      DC Planning Services  CM consult      [facility] Choice  HOME HEALTH   Choice offered to / List presented to:  C-1 Patient        HH arranged  HH-1 RN      [facility] agency  [facility]   Status of service:  Completed, signed off Medicare Important Message given?   (If response is "NO", the following Medicare IM given date fields will be blank) Date Medicare IM given:   Date Additional Medicare IM given:    Discharge Disposition:  HOME W HOME HEALTH SERVICES  Per UR Regulation:  Reviewed for med. necessity/level of care/duration of stay  If discussed at Long Length of Stay Meetings, dates discussed:   02/22/2012  02/27/2012  02/29/2012  03/05/2012  03/07/2012    Comments:  Contact:  Rakwon Alfano, spouse  #[phone] 03/07/12 Dylen Mcelhannon,RN,BSN [phone] PT FOR DISCHARGE TODAY WITH J TUBE.  [facility]HRN ARRANGED FOR J TUBE CARE/TEACHING.  NO TUBE FEEDINGS REQUIRED; PT TO MAINTAIN SOFT DIET.  DIETICIAN PROVIDED TEACHING TO WIFE AND PT ON 10/30 REGARDING DIET.  HHRN TO VISIT TODAY.  02/26/12 1200 Henrietta Mayo RN MSN CCM Ambulated 600 ft with staff, TNA 2/2 ileus.  02/22/12 0800 Verdis Prime RN MSN CCM Resection scheduled for 10/18.  02/19/12 1130 Henrietta Mayo RN MSN CCM Met @ bedside with pt and spouse.  [facility], spouse is concerned as MD told  her insurance would not reimburse as he was inpt.  TC to radiology - insurance does not pay for PET scans except as OP - provided information to spouse.

## 2012-03-08 ENCOUNTER — Telehealth: Payer: Self-pay | Admitting: Oncology

## 2012-03-08 NOTE — Telephone Encounter (Signed)
S/W pt wife in re NP appt 11/12 @ 1:30 w/ Dr. Truett Perna.  Welcome packet mailed.

## 2012-03-08 NOTE — Telephone Encounter (Signed)
C/D 03/08/12 for appt.03/19/12 °

## 2012-03-11 ENCOUNTER — Other Ambulatory Visit: Payer: Self-pay | Admitting: Cardiothoracic Surgery

## 2012-03-11 DIAGNOSIS — Z934 Other artificial openings of gastrointestinal tract status: Secondary | ICD-10-CM

## 2012-03-11 DIAGNOSIS — Z903 Acquired absence of stomach [part of]: Secondary | ICD-10-CM

## 2012-03-14 ENCOUNTER — Encounter: Payer: Self-pay | Admitting: Cardiothoracic Surgery

## 2012-03-14 ENCOUNTER — Ambulatory Visit
Admission: RE | Admit: 2012-03-14 | Discharge: 2012-03-14 | Disposition: A | Discharge status: Home / Self Care | Payer: Medicare HMO | Source: Ambulatory Visit | Attending: Cardiothoracic Surgery | Admitting: Cardiothoracic Surgery

## 2012-03-14 ENCOUNTER — Ambulatory Visit (INDEPENDENT_AMBULATORY_CARE_PROVIDER_SITE_OTHER): Payer: Self-pay | Admitting: Cardiothoracic Surgery

## 2012-03-14 VITALS — BP 124/70 | HR 70 | Resp 18 | Ht 67.0 in | Wt 158.0 lb

## 2012-03-14 DIAGNOSIS — Z934 Other artificial openings of gastrointestinal tract status: Secondary | ICD-10-CM

## 2012-03-14 DIAGNOSIS — Z09 Encounter for follow-up examination after completed treatment for conditions other than malignant neoplasm: Secondary | ICD-10-CM

## 2012-03-14 DIAGNOSIS — C160 Malignant neoplasm of cardia: Secondary | ICD-10-CM

## 2012-03-14 DIAGNOSIS — Z903 Acquired absence of stomach [part of]: Secondary | ICD-10-CM

## 2012-03-14 NOTE — Patient Instructions (Signed)
Continue same diet Return one month

## 2012-03-14 NOTE — Progress Notes (Signed)
[address]            [place] [address]          [phone]       [name] [facility] Medical Record #[id number] Date of Birth: 16-Sep-1932  Dr Francie Massing NP  Chief Complaint:   PostOp Follow Up Visit 02/23/2012  DATE OF DISCHARGE:  OPERATIVE REPORT  PREOPERATIVE DIAGNOSIS: Adenocarcinoma of the gastroesophageal  junction.  POSTOPERATIVE DIAGNOSIS: Adenocarcinoma of the gastroesophageal  junction.  SURGICAL PROCEDURE: Laparoscopic staging and mobilization of the  foregut endoscopically, open distal esophageal proximal gastric  resection with esophagogastroenterostomy, and placement of a feeding  jejunostomy tube.    History of Present Illness:      Feels well, taking po diet well, soft food. No dumping syndrome. Has had one episode of reflux. Hade fever one day last week to 101 , none now. Overall stronger.  Some loose stools      History  Smoking status  . Former Smoker -- 5.0 packs/day  . Types: Cigarettes  . Quit date: 01/11/1992  Smokeless tobacco  . Not on file       No Known Allergies  Current Outpatient Prescriptions  Medication Sig Dispense Refill  . amLODipine-benazepril (LOTREL) 10-40 MG per capsule Take 1 capsule by mouth daily.      . Aspirin 81 MG EC tablet Take 81 mg by mouth daily.        . carvedilol (COREG) 25 MG tablet Take 25 mg by mouth 2 (two) times daily with a meal.      . hydrochlorothiazide 25 MG tablet Take 25 mg by mouth daily.        . pantoprazole (PROTONIX) 40 MG tablet Take 1 tablet (40 mg total) by mouth 2 (two) times daily.  60 tablet  1  . pravastatin (PRAVACHOL) 10 MG tablet Take 10 mg by mouth at bedtime.      . prednisoLONE acetate (PRED FORTE) 1 % ophthalmic suspension Place 2-3 drops into the right eye at bedtime.      . vitamin C (ASCORBIC ACID) 500 MG tablet Take 500 mg by mouth 2 (two) times daily.      . traMADol (ULTRAM) 50 MG tablet Take 1-2 tablets (50-100 mg  total) by mouth every 6 (six) hours as needed for pain.  30 tablet  0   Wt Readings from Last 3 Encounters:  03/14/12 158 lb (71.668 kg)  03/07/12 174 lb 1.6 oz (78.971 kg)  03/07/12 174 lb 1.6 oz (78.971 kg)      Physical Exam: BP 124/70  Pulse 70  Resp 18  Ht 5\' 7"  (1.702 m)  Wt 158 lb (71.668 kg)  BMI 24.75 kg/m2  SpO2 91%  General appearance: alert and cooperative Neurologic: intact Heart: regular rate and rhythm, S1, S2 normal, no murmur, click, rub or gallop and normal apical impulse Lungs: clear to auscultation bilaterally and normal percussion bilaterally Abdomen: soft, non-tender; bowel sounds normal; no masses,  no organomegaly Extremities: extremities normal, atraumatic, no cyanosis or edema, Homans sign is negative, no sign of DVT and no edema, redness or tenderness in the calves or thighs Wound: wounds healing well  Diagnostic Studies & Laboratory data:         Recent Radiology Findings: Dg Chest 2 View  03/14/2012  *RADIOLOGY REPORT*  Clinical Data: Follow up gastrectomy and jejunostomy  CHEST - 2 VIEW  Comparison: 02/24/2012  Findings: Cardiomediastinal silhouette is stable.  Again noted status post median sternotomy and cardiac valve replacement. Stable minimal elevation of the right hemidiaphragm.  No acute infiltrate or pleural effusion.  No pulmonary edema.  Stable mild degenerative changes thoracic spine.  IMPRESSION: No active disease.  No significant change.   Original Report Authenticated By: Natasha Mead, M.D.       Recent Labs: Lab Results  Component Value Date   WBC 6.7 03/05/2012   HGB 8.8* 03/05/2012   HCT 28.0* 03/05/2012   PLT 250 03/05/2012   GLUCOSE 109* 03/05/2012   CHOL 107 03/04/2012   TRIG 85 03/04/2012   ALT 50 03/04/2012   AST 21 03/04/2012   NA 139 03/05/2012   K 4.1 03/05/2012   CL 104 03/05/2012   CREATININE 0.55 03/05/2012   BUN 12 03/05/2012   CO2 28 03/05/2012   INR 1.14 02/22/2012      Assessment / Plan:    Resected  GE  junction adeno carcinoma Stage IIB Stable post op  To see medical oncology next week Will see in one month Leave j tube until after sees oncology, consider leaving until chemo decided on and how it is tolerated.   [name] B 03/14/2012 9:22 AM

## 2012-03-19 ENCOUNTER — Telehealth: Payer: Self-pay | Admitting: Oncology

## 2012-03-19 ENCOUNTER — Ambulatory Visit: Payer: Medicare HMO

## 2012-03-19 ENCOUNTER — Ambulatory Visit (HOSPITAL_BASED_OUTPATIENT_CLINIC_OR_DEPARTMENT_OTHER): Payer: Medicare HMO | Admitting: Oncology

## 2012-03-19 ENCOUNTER — Encounter: Payer: Self-pay | Admitting: Oncology

## 2012-03-19 ENCOUNTER — Ambulatory Visit (HOSPITAL_BASED_OUTPATIENT_CLINIC_OR_DEPARTMENT_OTHER): Payer: Medicare HMO | Admitting: Lab

## 2012-03-19 VITALS — BP 135/68 | HR 68 | Temp 97.2°F | Resp 20 | Ht 65.25 in | Wt 158.4 lb

## 2012-03-19 DIAGNOSIS — C160 Malignant neoplasm of cardia: Secondary | ICD-10-CM

## 2012-03-19 DIAGNOSIS — C779 Secondary and unspecified malignant neoplasm of lymph node, unspecified: Secondary | ICD-10-CM

## 2012-03-19 DIAGNOSIS — C169 Malignant neoplasm of stomach, unspecified: Secondary | ICD-10-CM

## 2012-03-19 DIAGNOSIS — D649 Anemia, unspecified: Secondary | ICD-10-CM

## 2012-03-19 LAB — CBC WITH DIFFERENTIAL/PLATELET
BASO%: 0.6 % (ref 0.0–2.0)
Basophils Absolute: 0 10*3/uL (ref 0.0–0.1)
EOS%: 8.1 % — ABNORMAL HIGH (ref 0.0–7.0)
Eosinophils Absolute: 0.5 10*3/uL (ref 0.0–0.5)
HCT: 32.4 % — ABNORMAL LOW (ref 38.4–49.9)
HGB: 10.8 g/dL — ABNORMAL LOW (ref 13.0–17.1)
LYMPH%: 23.1 % (ref 14.0–49.0)
MCH: 26.9 pg — ABNORMAL LOW (ref 27.2–33.4)
MCHC: 33.4 g/dL (ref 32.0–36.0)
MCV: 80.4 fL (ref 79.3–98.0)
MONO#: 0.4 10*3/uL (ref 0.1–0.9)
MONO%: 7.5 % (ref 0.0–14.0)
NEUT#: 3.6 10*3/uL (ref 1.5–6.5)
NEUT%: 60.7 % (ref 39.0–75.0)
Platelets: 222 10*3/uL (ref 140–400)
RBC: 4.03 10*6/uL — ABNORMAL LOW (ref 4.20–5.82)
RDW: 17.6 % — ABNORMAL HIGH (ref 11.0–14.6)
WBC: 5.9 10*3/uL (ref 4.0–10.3)
lymph#: 1.4 10*3/uL (ref 0.9–3.3)

## 2012-03-19 NOTE — Progress Notes (Signed)
Checked in new pt with no financial concerns. °

## 2012-03-19 NOTE — Telephone Encounter (Signed)
Gave referral to [name] @ [facility] for referral to [facility].Pt wants to been seen there due to the distance

## 2012-03-19 NOTE — Progress Notes (Signed)
Met with patient and family.  Resource information and contact numbers for [facility] were provided.  Per Dr. Truett Perna, patient to receive treatment at [facility] since it is closer for patient to drive to.  Encouraged patient to call if needs any further assistance.

## 2012-03-19 NOTE — Progress Notes (Signed)
[facility] New Patient Consult   Referring MD: Kennieth Plotts 78 y.o.  [birth year]    Reason for Referral: Adenocarcinoma of the gastroesophageal junction     HPI: He developed dark stools and had one episode of hematemesis. He then had generalized weakness and saw his primary care physician and was noted to have anemia. He was referred to the emergency room. His Hemovac returned at 6.8. He was admitted to the ICU at [facility] and transfused with packed red blood cells. An upper endoscopy by Dr. Charm Barges on 02/14/2012 showed a 4 cm mass at the gastroesophageal junction with a visible bleeding vessel. This was injected with epinephrine in the bleeding was controlled. He was transferred to [facility] for further evaluation.   On 02/16/2012 endoscopic ultrasound. At the EG junction a 3 cm mass was noted. Multiple biopsies were obtained. The lesion was felt to be at least a T2. The TSH was difficult secondary to the pedunculated nature of the lesion. Subcentimeter lymph nodes were identified and the subcarinal, AP window, and celiac axis region. The biopsy confirmed adenocarcinoma with no amplification of HER-2. A staging PET scan on 02/20/2012 revealed diffuse activity in the enlarged and heterogenous right thyroid. Associated with a tree in blood nodularity in the lingula 8 1 x 0 point centimeter hypermetabolic nodule was noted with an SUV of 10. A similar 0.8 x 0.6 cm  nodule was noted in the anterior right upper lobe with an SUV of 6 . Faint associated metabolic activity was noted at the GE junction. No paraesophageal nodal hypermetabolic activity noted.  He developed recurrent hematemesis 02/17/2012 and was taken to an emergent embolization procedure of the left gastric artery. No evidence of abnormal vasculature or active bleeding was noted.  He was taken the operating room on 02/23/2012 and underwent a distal esophageal/proximal gastric resection  and placement of a jejunostomy feeding tube.  The pathology ([id number]) confirmed an invasive moderate to poorly differentiated adenocarcinoma with signet ring cell differentiation, 2.5 cm in greatest dimension. The tumor arose from the gastroesophageal junction. The tumor was less than 0.1 cm from the radial margin. The other margins were negative. 2 of 9 lymph nodes were positive for metastatic carcinoma with extracapsular extension in one of the lymph nodes.  Tumor invaded through the muscular propria into the adventitia.  He continues to recover from surgery. He is eating mostly a liquid and soft diet. He has not used the jejunostomy tube for feeding. He was discharged on 03/07/2012.     Past Medical History  Diagnosis Date  . Anemia  October 2013   . Hypertension   . COPD (chronic obstructive pulmonary disease)   . Gastric cancer 02/15/2012    Past Surgical History  Procedure Date  .  aortic valve replacement-Dr. Arvilla Market in [place]   2012   . Joint replacement-bilateral knee replacement    . Eus 02/16/2012    Procedure: UPPER ENDOSCOPIC ULTRASOUND (EUS) RADIAL;  Surgeon: Theda Belfast, MD;  Location: [facility];  Service: Endoscopy;  Laterality: Left;  930 carelink arranged by Tobi Bastos -[facility] nurse at [facility]  . Gastrectomy 02/23/2012    Procedure: GASTRECTOMY;  Surgeon: Delight Ovens, MD;  Location: [facility] OR;  Service: Thoracic;  Laterality: N/A;  esophogastrectomy, proximal third stomach and distal esophagus  . Jejunostomy 02/23/2012    Procedure: JEJUNOSTOMY;  Surgeon: Delight Ovens, MD;  Location: [facility] OR;  Service: Thoracic;  Laterality: Left;   .  Right ankle surgery  .    Left inguinal hernia repair in the 1970s  Family history: One brother and 4 children, no family history of cancer Current outpatient prescriptions:amLODipine-benazepril (LOTREL) 10-40 MG per capsule, Take 1 capsule by mouth daily., Disp: , Rfl: ;  Aspirin 81 MG EC tablet, Take 81 mg by mouth daily.  , Disp:  , Rfl: ;  carvedilol (COREG) 25 MG tablet, Take 25 mg by mouth 2 (two) times daily with a meal., Disp: , Rfl: ;  hydrochlorothiazide 25 MG tablet, Take 25 mg by mouth daily.  , Disp: , Rfl:  pantoprazole (PROTONIX) 40 MG tablet, Take 1 tablet (40 mg total) by mouth 2 (two) times daily., Disp: 60 tablet, Rfl: 1;  pravastatin (PRAVACHOL) 10 MG tablet, Take 10 mg by mouth at bedtime., Disp: , Rfl: ;  prednisoLONE acetate (PRED FORTE) 1 % ophthalmic suspension, Place 2-3 drops into the right eye at bedtime., Disp: , Rfl: ;  vitamin C (ASCORBIC ACID) 500 MG tablet, Take 500 mg by mouth 2 (two) times daily., Disp: , Rfl:  traMADol (ULTRAM) 50 MG tablet, Take 1-2 tablets (50-100 mg total) by mouth every 6 (six) hours as needed for pain., Disp: 30 tablet, Rfl: 0  Allergies: No Known Allergies  Social History: He [place] in [place]. 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 He quit smoking cigarettes 30 years ago. He does not use alcohol. No transfusion history prior to the gastrointestinal bleeding in October of 2013. No risk factor for HIV or hepatitis.   ROS:   Positives include: Fatigue, right eye blindness since childhood  A complete ROS was otherwise negative.  Physical Exam:  Blood pressure 135/68, pulse 68, temperature 97.2 F (36.2 C), temperature source Oral, resp. rate 20, height 5' 5.25" (1.657 m), weight 158 lb 6.4 oz (71.85 kg).  HEENT: Upper denture plate, partial lower denture plate, oropharynx without visible mass, neck without mass Lungs: Lungs with end inspiratory rhonchi at the left base Cardiac: Regular rate and rhythm Abdomen: No hepatomegaly, nontender, healed surgical incision GU: Testes without mass  Vascular: No leg edema Lymph nodes: No cervical, supraclavicular, axillary, or inguinal nodes Neurologic: Alert and oriented, the motor exam appears intact in the upper and lower Jevity's Skin: No  rash   LAB:  CBC  Lab Results  Component Value Date   WBC 5.9 03/19/2012   HGB 10.8* 03/19/2012   HCT 32.4* 03/19/2012   MCV 80.4 03/19/2012   PLT 222 03/19/2012     CMP      Component Value Date/Time   NA 139 03/05/2012 0500   K 4.1 03/05/2012 0500   CL 104 03/05/2012 0500   CO2 28 03/05/2012 0500   GLUCOSE 109* 03/05/2012 0500   BUN 12 03/05/2012 0500   CREATININE 0.55 03/05/2012 0500   CALCIUM 8.3* 03/05/2012 0500   PROT 5.3* 03/04/2012 0500   ALBUMIN 2.1* 03/04/2012 0500   AST 21 03/04/2012 0500   ALT 50 03/04/2012 0500   ALKPHOS 72 03/04/2012 0500   BILITOT 0.2* 03/04/2012 0500   GFRNONAA >90 03/05/2012 0500   GFRAA >90 03/05/2012 0500     Radiology: As per history of present illness    Assessment/Plan:   1. Stage III (T3 N1) adenocarcinoma of the gastroesophageal junction, status post an esophagogastrectomy on 02/23/2012  2. Anemia secondary to gastrointestinal bleeding  3. Aortic valve replacement in 2012  4. Placement  of jejunostomy feeding tube 02/23/2012-he may require tube feedings during the course of adjuvant chemotherapy/radiation  5. Nodular regions of hypermetabolic activity in the right upper lobe and lingula on the staging PET scan 02/20/2012-likely related to benign inflammatory changes, these areas can be followed on repeat imaging studies.   Disposition:   He has been diagnosed with adenocarcinoma of the gastroesophageal junction. I discussed the diagnosis and adjuvant treatment options with Mr. Corona and his wife. He remains at high risk for developing recurrent carcinoma over the next several years. I discussed the data supporting the recommendation for adjuvant radiation and systemic chemotherapy in this setting.  I recommend adjuvant chemotherapy and radiation with a modification of the regimen used in the INT0116 study. I recommend chemotherapy with 5-FU/leucovorin ([place] regimen) or Xeloda to be followed by concurrent Xeloda  and radiation. This can be followed by additional 5-fluorouracil based chemotherapy.  Mr. Saffle lives closer to the [facility] and he would like to receive his treatment there. We will make a referral to Dr. Melvyn Neth in medical oncology and Dr. Clovis Riley in radiation oncology.  A followup appointment  was not scheduled at the [facility]. I will be glad to see Mr. Trokey in the future as needed. Lilliauna Van 03/19/2012, 5:28 PM

## 2012-03-21 ENCOUNTER — Telehealth: Payer: Self-pay | Admitting: Nurse Practitioner

## 2012-03-21 NOTE — Telephone Encounter (Signed)
Called to inform patient-  Appts scheduled at [facility]-  Dr. Melvyn Neth 03/29/2013 @ 315pm  Dr. Clovis Riley 05/14/2012 @0900 .  Left message with information.  Per Britta Mccreedy at [facility] they will mail him a packet of information.

## 2012-03-25 ENCOUNTER — Telehealth: Payer: Self-pay | Admitting: Oncology

## 2012-03-25 NOTE — Telephone Encounter (Signed)
Pt appt with Dr. Melvyn Neth 03/29/12 @ 3:15

## 2012-04-02 ENCOUNTER — Ambulatory Visit (INDEPENDENT_AMBULATORY_CARE_PROVIDER_SITE_OTHER): Payer: Medicare HMO | Admitting: Surgery

## 2012-04-02 ENCOUNTER — Encounter (INDEPENDENT_AMBULATORY_CARE_PROVIDER_SITE_OTHER): Payer: Self-pay | Admitting: Surgery

## 2012-04-02 VITALS — BP 162/70 | HR 68 | Temp 98.0°F | Resp 16 | Ht 66.0 in | Wt 155.2 lb

## 2012-04-02 DIAGNOSIS — C169 Malignant neoplasm of stomach, unspecified: Secondary | ICD-10-CM

## 2012-04-02 DIAGNOSIS — Z9889 Other specified postprocedural states: Secondary | ICD-10-CM

## 2012-04-02 DIAGNOSIS — Z903 Acquired absence of stomach [part of]: Secondary | ICD-10-CM

## 2012-04-02 NOTE — Progress Notes (Signed)
[name] 78 y.o.  Body mass index is 25.05 kg/(m^2).  Patient Active Problem List  Diagnosis  . Acute upper GI bleed  . Gastric cancer  . S/P aortic valve replacement    No Known Allergies  Past Surgical History  Procedure Date  . Cardiac valve replacement   . Joint replacement   . Eus 02/16/2012    Procedure: UPPER ENDOSCOPIC ULTRASOUND (EUS) RADIAL;  Surgeon: Theda Belfast, MD;  Location: [facility];  Service: Endoscopy;  Laterality: Left;  930 carelink arranged by Tobi Bastos -his nurse at [facility]  . Gastrectomy 02/23/2012    Procedure: GASTRECTOMY;  Surgeon: Delight Ovens, MD;  Location: [facility] OR;  Service: Thoracic;  Laterality: N/A;  esophogastrectomy, proximal third stomach and distal esophagus  . Jejunostomy 02/23/2012    Procedure: JEJUNOSTOMY;  Surgeon: Delight Ovens, MD;  Location: [facility] OR;  Service: Thoracic;  Laterality: Left;   MOON,AMY, NP No diagnosis found.  Postop visit for [name].  He has his jejunostomy tube in place and it is secure.  I removed some Vicryl from left lateral 5 mm port.  He is setting up chemotherapy and RT in [place] in January.   I'll see back in early January.  Healing well thus far.   Return Jan.  Matt B. Daphine Deutscher, MD, [facility] [phone] beeper [phone]  04/02/2012 4:53 PM

## 2012-04-02 NOTE — Patient Instructions (Addendum)
Thanks for your patience.  If you need further assistance after leaving the office, please call our office and speak with a [facility] nurse.  [phone].  If you want to leave a message for Dr. Rondarius Kadrmas, please call his office phone at [phone]. 

## 2012-04-10 ENCOUNTER — Encounter: Payer: Self-pay | Admitting: Cardiothoracic Surgery

## 2012-04-10 ENCOUNTER — Ambulatory Visit (INDEPENDENT_AMBULATORY_CARE_PROVIDER_SITE_OTHER): Payer: Self-pay | Admitting: Cardiothoracic Surgery

## 2012-04-10 VITALS — BP 134/68 | HR 68 | Resp 20 | Ht 67.0 in | Wt 154.0 lb

## 2012-04-10 DIAGNOSIS — C160 Malignant neoplasm of cardia: Secondary | ICD-10-CM

## 2012-04-10 DIAGNOSIS — Z09 Encounter for follow-up examination after completed treatment for conditions other than malignant neoplasm: Secondary | ICD-10-CM

## 2012-04-10 NOTE — Progress Notes (Signed)
[address]            [place] [address]          [phone]        [name] [facility] Medical Record #[id number] Date of Birth: [birth year]  Dr Francie Massing NP  Chief Complaint:   PostOp Follow Up Visit 02/23/2012  DATE OF DISCHARGE:  OPERATIVE REPORT  PREOPERATIVE DIAGNOSIS: Adenocarcinoma of the gastroesophageal  junction.  POSTOPERATIVE DIAGNOSIS: Adenocarcinoma of the gastroesophageal  junction.  SURGICAL PROCEDURE: Laparoscopic staging and mobilization of the  foregut endoscopically, open distal esophageal proximal gastric  resection with esophagogastroenterostomy, and placement of a feeding  jejunostomy tube.    History of Present Illness:      Feels well, taking po diet well, soft food. No dumping syndrome. Overall he is feeling stronger. He did note a hospitalization after he was given intravenous iron and had a "reaction".  He's had some intermittent diarrhea but this has been easily controlled.  He's been seen by oncology and is to start chemotherapy based on 5-FU next week    History  Smoking status  . Former Smoker -- 5.0 packs/day  . Types: Cigarettes  . Quit date: 01/11/1992  Smokeless tobacco  . Not on file       No Known Allergies  Current Outpatient Prescriptions  Medication Sig Dispense Refill  . amLODipine-benazepril (LOTREL) 10-40 MG per capsule Take 1 capsule by mouth daily.      . carvedilol (COREG) 25 MG tablet Take 25 mg by mouth 2 (two) times daily with a meal.      . hydrochlorothiazide 25 MG tablet Take 25 mg by mouth daily.        . pantoprazole (PROTONIX) 40 MG tablet Take 1 tablet (40 mg total) by mouth 2 (two) times daily.  60 tablet  1  . pravastatin (PRAVACHOL) 10 MG tablet Take 10 mg by mouth at bedtime.      . prednisoLONE acetate (PRED FORTE) 1 % ophthalmic suspension Place 2-3 drops into the right eye at bedtime.      . vitamin B-12 (CYANOCOBALAMIN) 1000 MCG tablet Take  1,000 mcg by mouth daily.      . vitamin C (ASCORBIC ACID) 500 MG tablet Take 500 mg by mouth 2 (two) times daily.       Wt Readings from Last 3 Encounters:  04/10/12 154 lb (69.854 kg)  04/02/12 155 lb 3.2 oz (70.398 kg)  03/19/12 158 lb 6.4 oz (71.85 kg)      Physical Exam: BP 134/68  Pulse 68  Resp 20  Ht 5\' 7"  (1.702 m)  Wt 154 lb (69.854 kg)  BMI 24.12 kg/m2  SpO2 93%  General appearance: alert and cooperative Neurologic: intact Heart: regular rate and rhythm, S1, S2 normal, no murmur, click, rub or gallop and normal apical impulse Lungs: clear to auscultation bilaterally and normal percussion bilaterally Abdomen: soft, non-tender; bowel sounds normal; no masses,  no organomegaly Extremities: extremities normal, atraumatic, no cyanosis or edema, Homans sign is negative, no sign of DVT and no edema, redness or tenderness in the calves or thighs Wound: wounds healing well  Diagnostic Studies & Laboratory data:         Recent Radiology Findings: No results found.    Recent Labs: Lab Results  Component Value Date   WBC 5.9 03/19/2012   HGB  10.8* 03/19/2012   HCT 32.4* 03/19/2012   PLT 222 03/19/2012   GLUCOSE 109* 03/05/2012   CHOL 107 03/04/2012   TRIG 85 03/04/2012   ALT 50 03/04/2012   AST 21 03/04/2012   NA 139 03/05/2012   K 4.1 03/05/2012   CL 104 03/05/2012   CREATININE 0.55 03/05/2012   BUN 12 03/05/2012   CO2 28 03/05/2012   INR 1.14 02/22/2012      Assessment / Plan:   Resected GE  junction adeno carcinoma Stage IIB Stable post op  To start 5-FU based chemotherapy next week followed by radiation therapy Will see in two months Leave j tube until after sees oncology, consider leaving until chemo decided on and how it is tolerated.   Kymber Kosar B 04/10/2012 5:44 PM

## 2012-04-11 ENCOUNTER — Encounter: Payer: Medicare HMO | Admitting: Cardiothoracic Surgery

## 2012-05-13 ENCOUNTER — Other Ambulatory Visit: Payer: Self-pay | Admitting: Physician Assistant

## 2012-05-15 ENCOUNTER — Other Ambulatory Visit: Payer: Self-pay | Admitting: Physician Assistant

## 2012-05-18 ENCOUNTER — Other Ambulatory Visit: Payer: Self-pay | Admitting: Physician Assistant

## 2012-05-22 ENCOUNTER — Encounter (INDEPENDENT_AMBULATORY_CARE_PROVIDER_SITE_OTHER): Payer: Medicare HMO | Admitting: Surgery

## 2012-05-23 ENCOUNTER — Encounter (INDEPENDENT_AMBULATORY_CARE_PROVIDER_SITE_OTHER): Payer: Medicare HMO | Admitting: Surgery

## 2012-05-24 ENCOUNTER — Telehealth (INDEPENDENT_AMBULATORY_CARE_PROVIDER_SITE_OTHER): Payer: Self-pay

## 2012-05-24 NOTE — Telephone Encounter (Signed)
Spoke with pt in regards to his new appt date and time - Fri, Jan 31 @10a 

## 2012-06-07 ENCOUNTER — Ambulatory Visit (INDEPENDENT_AMBULATORY_CARE_PROVIDER_SITE_OTHER): Payer: Medicare HMO | Admitting: Surgery

## 2012-06-07 ENCOUNTER — Encounter (INDEPENDENT_AMBULATORY_CARE_PROVIDER_SITE_OTHER): Payer: Self-pay | Admitting: Surgery

## 2012-06-07 VITALS — BP 140/88 | HR 64 | Temp 97.6°F | Resp 14 | Ht 65.75 in | Wt 146.8 lb

## 2012-06-07 DIAGNOSIS — Z903 Acquired absence of stomach [part of]: Secondary | ICD-10-CM

## 2012-06-07 DIAGNOSIS — Z9889 Other specified postprocedural states: Secondary | ICD-10-CM

## 2012-06-07 NOTE — Patient Instructions (Signed)
Try Miralax to improve constipation

## 2012-06-07 NOTE — Progress Notes (Signed)
[name] 79 y.o.  Body mass index is 23.87 kg/(m^2).  Patient Active Problem List  Diagnosis  . Acute upper GI bleed  . Gastric cancer  . S/P aortic valve replacement  . Distal esophageal/Proximal gastrectomy for gastric adenocarcinoma Oct 2013    No Known Allergies  Past Surgical History  Procedure Date  . Cardiac valve replacement   . Joint replacement   . Eus 02/16/2012    Procedure: UPPER ENDOSCOPIC ULTRASOUND (EUS) RADIAL;  Surgeon: Theda Belfast, MD;  Location: [facility];  Service: Endoscopy;  Laterality: Left;  930 carelink arranged by Tobi Bastos -[facility] nurse at [facility]  . Gastrectomy 02/23/2012    Procedure: GASTRECTOMY;  Surgeon: Delight Ovens, MD;  Location: [facility] OR;  Service: Thoracic;  Laterality: N/A;  esophogastrectomy, proximal third stomach and distal esophagus  . Jejunostomy 02/23/2012    Procedure: JEJUNOSTOMY;  Surgeon: Delight Ovens, MD;  Location: [facility] OR;  Service: Thoracic;  Laterality: Left;   MOON,AMY, NP No diagnosis found.  [name] is in the midst of chem/RT.  His appetite is diminished and I discussed with his wife using some liquid feedings through his jejunostomy tube. For the first time in his life he is having some issues with constipation. I encouraged him to try the use of MiraLAX to see if that will help with his constipation. His J-tube is secured. If that suture posterior I encouraged him to get to his local physician to have that re\re secured.  He'll be seeing Dr. Tyrone Sage in a week or 2. I'll put him down to see him back in about 2 months. Matt B. Daphine Deutscher, MD, [facility] [phone] beeper [phone]  06/07/2012 10:51 AM

## 2012-06-11 ENCOUNTER — Other Ambulatory Visit: Payer: Self-pay | Admitting: *Deleted

## 2012-06-11 DIAGNOSIS — C159 Malignant neoplasm of esophagus, unspecified: Secondary | ICD-10-CM

## 2012-06-13 ENCOUNTER — Ambulatory Visit (INDEPENDENT_AMBULATORY_CARE_PROVIDER_SITE_OTHER): Payer: Medicare HMO | Admitting: Cardiothoracic Surgery

## 2012-06-13 ENCOUNTER — Encounter: Payer: Self-pay | Admitting: Cardiothoracic Surgery

## 2012-06-13 ENCOUNTER — Ambulatory Visit
Admission: RE | Admit: 2012-06-13 | Discharge: 2012-06-13 | Disposition: A | Discharge status: Home / Self Care | Payer: Medicare HMO | Source: Ambulatory Visit | Attending: Cardiothoracic Surgery | Admitting: Cardiothoracic Surgery

## 2012-06-13 VITALS — BP 116/68 | HR 76 | Resp 16 | Ht 67.0 in | Wt 145.0 lb

## 2012-06-13 DIAGNOSIS — C160 Malignant neoplasm of cardia: Secondary | ICD-10-CM

## 2012-06-13 DIAGNOSIS — C159 Malignant neoplasm of esophagus, unspecified: Secondary | ICD-10-CM

## 2012-06-13 DIAGNOSIS — Z09 Encounter for follow-up examination after completed treatment for conditions other than malignant neoplasm: Secondary | ICD-10-CM

## 2012-06-13 NOTE — Progress Notes (Signed)
[address]            [place] [address]          [phone]        [name] [facility] Medical Record #[id number] Date of Birth: December 22, 1932  Dr Francie Massing NP  Chief Complaint:   PostOp Follow Up Visit 02/23/2012  DATE OF DISCHARGE:  OPERATIVE REPORT  PREOPERATIVE DIAGNOSIS: Adenocarcinoma of the gastroesophageal  junction.  POSTOPERATIVE DIAGNOSIS: Adenocarcinoma of the gastroesophageal  junction.  SURGICAL PROCEDURE: Laparoscopic staging and mobilization of the  foregut endoscopically, open distal esophageal proximal gastric  resection with esophagogastroenterostomy, and placement of a feeding  jejunostomy tube.    History of Present Illness:      Patient is progressing okay, currently receiving radiation. He does note some fatigue and poor appetite. He's had approximately 9 pound weight loss over several months. He denies any dumping symptoms. Is able to take by mouth but has no appetite.   History  Smoking status  . Former Smoker -- 5.0 packs/day  . Types: Cigarettes  . Quit date: 01/11/1992  Smokeless tobacco  . Not on file       No Known Allergies  Current Outpatient Prescriptions  Medication Sig Dispense Refill  . amLODipine-benazepril (LOTREL) 10-40 MG per capsule Take 1 capsule by mouth daily.      . carvedilol (COREG) 25 MG tablet Take 12.5 mg by mouth 2 (two) times daily with a meal.       . hydrochlorothiazide 25 MG tablet Take 25 mg by mouth daily.        . Multiple Vitamin (MULTIVITAMIN) capsule Take 1 capsule by mouth daily.      . ondansetron (ZOFRAN) 8 MG tablet Ad lib.      . pantoprazole (PROTONIX) 40 MG tablet Take 1 tablet (40 mg total) by mouth 2 (two) times daily.  60 tablet  1  . pravastatin (PRAVACHOL) 10 MG tablet Take 10 mg by mouth at bedtime.      . prednisoLONE acetate (PRED FORTE) 1 % ophthalmic suspension Place 2-3 drops into the right eye at bedtime.      . vitamin [name]-12  (CYANOCOBALAMIN) 1000 MCG tablet Take 1,000 mcg by mouth daily.      . vitamin C (ASCORBIC ACID) 500 MG tablet Take 500 mg by mouth 2 (two) times daily.       Wt Readings from Last 3 Encounters:  06/13/12 145 lb (65.772 kg)  06/07/12 146 lb 12.8 oz (66.588 kg)  04/10/12 154 lb (69.854 kg)      Physical Exam: BP 116/68  Pulse 76  Resp 16  Ht 5\' 7"  (1.702 m)  Wt 145 lb (65.772 kg)  BMI 22.71 kg/m2  SpO2 94%  General appearance: alert and cooperative Neurologic: intact Heart: regular rate and rhythm, S1, S2 normal, no murmur, click, rub or gallop and normal apical impulse Lungs: clear to auscultation bilaterally and normal percussion bilaterally Abdomen: soft, non-tender; bowel sounds normal; no masses,  no organomegaly Extremities: extremities normal, atraumatic, no cyanosis or edema, Homans sign is negative, no sign of DVT and no edema, redness or tenderness in the calves or thighs Wound: wounds healing well  Diagnostic Studies & Laboratory data:         Recent Radiology Findings: Dg Chest 2 View  06/13/2012  *RADIOLOGY REPORT*  Clinical Data: Esophageal carcinoma,  follow-up, Port-A-Cath  CHEST - 2 VIEW  Comparison: Portable chest x-ray of 04/12/2012 and two-view chest of 03/14/2012  Findings: The lungs remain somewhat hyperaerated consistent with emphysema.  Mediastinal contours are stable.  A right-sided Port-A- Cath is present with the tip overlying the upper SVC.  Mild cardiomegaly is stable.  Median sternotomy sutures are noted with aortic valve replacement present.  No skeletal abnormality is seen.  IMPRESSION:  COPD.  No active lung disease.  Port-A-Cath tip overlies the upper SVC.   Original Report Authenticated By: Dwyane Dee, M.D.       Recent Labs: Lab Results  Component Value Date   WBC 5.9 03/19/2012   HGB 10.8* 03/19/2012   HCT 32.4* 03/19/2012   PLT 222 03/19/2012   GLUCOSE 109* 03/05/2012   CHOL 107 03/04/2012   TRIG 85 03/04/2012   ALT 50 03/04/2012   AST  21 03/04/2012   NA 139 03/05/2012   K 4.1 03/05/2012   CL 104 03/05/2012   CREATININE 0.55 03/05/2012   BUN 12 03/05/2012   CO2 28 03/05/2012   INR 1.14 02/22/2012      Assessment / Plan:   Resected GE  junction adeno carcinoma Stage IIB (pT3,pN1,cM0) Stable post op but has had 9 lb wt loss over several months with treatments, will start night time tube feeding to supplement Started  5-FU based chemotherapy next week followed by radiation therapy Will see in two months  [name] 06/13/2012 10:53 AM

## 2012-07-11 ENCOUNTER — Ambulatory Visit (INDEPENDENT_AMBULATORY_CARE_PROVIDER_SITE_OTHER): Payer: Medicare HMO | Admitting: Cardiothoracic Surgery

## 2012-07-11 VITALS — BP 123/73 | HR 71 | Resp 16 | Ht 66.0 in | Wt 136.0 lb

## 2012-07-11 DIAGNOSIS — C160 Malignant neoplasm of cardia: Secondary | ICD-10-CM

## 2012-07-11 DIAGNOSIS — Z09 Encounter for follow-up examination after completed treatment for conditions other than malignant neoplasm: Secondary | ICD-10-CM

## 2012-07-11 NOTE — Progress Notes (Signed)
[address]       [place] [address]             [phone]         LYNX GOODRICH [facility] Medical Record #[id number] Date of Birth: 20-Oct-1932  Dr Francie Massing NP  Chief Complaint:   PostOp Follow Up Visit 02/23/2012  DATE OF DISCHARGE:  OPERATIVE REPORT  PREOPERATIVE DIAGNOSIS: Adenocarcinoma of the gastroesophageal  junction.  POSTOPERATIVE DIAGNOSIS: Adenocarcinoma of the gastroesophageal  junction.  SURGICAL PROCEDURE: Laparoscopic staging and mobilization of the  foregut endoscopically, open distal esophageal proximal gastric  resection with esophagogastroenterostomy, and placement of a feeding  jejunostomy tube.    History of Present Illness:      Patient is progressing okay, he is now completed his radiation therapy. He does note some fatigue and poor appetite. Marland Kitchen He denies any dumping symptoms. Is able to take by mouth but has no appetite. Is now using tube feedings at nighttime. Patient noted this morning that his J-tube had become partially dislodged and moved out slightly and comes to the office today to have it re\re secured  History  Smoking status  . Former Smoker -- 5.00 packs/day  . Types: Cigarettes  . Quit date: 01/11/1992  Smokeless tobacco  . Not on file       No Known Allergies  Current Outpatient Prescriptions  Medication Sig Dispense Refill  . amLODipine-benazepril (LOTREL) 10-40 MG per capsule Take 1 capsule by mouth daily.      . carvedilol (COREG) 25 MG tablet Take 12.5 mg by mouth daily.       . hydrochlorothiazide 25 MG tablet Take 25 mg by mouth daily.        . Multiple Vitamin (MULTIVITAMIN) capsule Take 1 capsule by mouth daily.      . ondansetron (ZOFRAN) 8 MG tablet Ad lib.      . pantoprazole (PROTONIX) 40 MG tablet Take 1 tablet (40 mg total) by mouth 2 (two) times daily.  60 tablet  1  . pravastatin (PRAVACHOL) 10 MG tablet Take 10 mg by mouth at bedtime.      . prednisoLONE  acetate (PRED FORTE) 1 % ophthalmic suspension Place 2-3 drops into the right eye at bedtime.      . prochlorperazine (COMPAZINE) 10 MG tablet Take 10 mg by mouth every 6 (six) hours as needed.      . vitamin B-12 (CYANOCOBALAMIN) 1000 MCG tablet Take 1,000 mcg by mouth daily.      . vitamin C (ASCORBIC ACID) 500 MG tablet Take 500 mg by mouth 2 (two) times daily.       No current facility-administered medications for this visit.   Wt Readings from Last 3 Encounters:  07/11/12 136 lb (61.689 kg)  06/13/12 145 lb (65.772 kg)  06/07/12 146 lb 12.8 oz (66.588 kg)      Physical Exam: BP 123/73  Pulse 71  Resp 16  Ht 5\' 6"  (1.676 m)  Wt 136 lb (61.689 kg)  BMI 21.96 kg/m2  SpO2 96% Wt Readings from Last 3 Encounters:  07/11/12 136 lb (61.689 kg)  06/13/12 145 lb (65.772 kg)  06/07/12 146 lb 12.8 oz (66.588 kg)    General appearance: alert and cooperative Neurologic: intact Heart: regular rate and rhythm, S1, S2 normal, no murmur, click, rub or gallop and normal apical impulse Lungs: clear  to auscultation bilaterally and normal percussion bilaterally Abdomen: soft, non-tender; bowel sounds normal; no masses,  no organomegaly Extremities: extremities normal, atraumatic, no cyanosis or edema, Homans sign is negative, no sign of DVT and no edema, redness or tenderness in the calves or thighs Wound: wounds healing well Patient's J-tube has become dislodged, the reason for his appointment today. We cleaned area infiltrated lidocaine and placed in the stitch to hold the tube in place without difficulty ` Diagnostic Studies & Laboratory data:         Recent Radiology Findings: No results found.    Recent Labs: Lab Results  Component Value Date   WBC 5.9 03/19/2012   HGB 10.8* 03/19/2012   HCT 32.4* 03/19/2012   PLT 222 03/19/2012   GLUCOSE 109* 03/05/2012   CHOL 107 03/04/2012   TRIG 85 03/04/2012   ALT 50 03/04/2012   AST 21 03/04/2012   NA 139 03/05/2012   K 4.1  03/05/2012   CL 104 03/05/2012   CREATININE 0.55 03/05/2012   BUN 12 03/05/2012   CO2 28 03/05/2012   INR 1.14 02/22/2012      Assessment / Plan:   Resected GE  junction adeno carcinoma Stage IIB (pT3,pN1,cM0) Stable post op but has had 10lb wt loss over several months with treatments, continue night time tube feeding to supplement  Will see in two months  Kinslee Dalpe B 07/11/2012 2:33 PM

## 2012-08-08 ENCOUNTER — Encounter: Payer: Self-pay | Admitting: Cardiothoracic Surgery

## 2012-08-08 ENCOUNTER — Ambulatory Visit (INDEPENDENT_AMBULATORY_CARE_PROVIDER_SITE_OTHER): Payer: Medicare HMO | Admitting: Cardiothoracic Surgery

## 2012-08-08 VITALS — BP 120/69 | HR 78 | Resp 18 | Ht 66.0 in | Wt 141.0 lb

## 2012-08-08 DIAGNOSIS — C160 Malignant neoplasm of cardia: Secondary | ICD-10-CM

## 2012-08-08 DIAGNOSIS — Z09 Encounter for follow-up examination after completed treatment for conditions other than malignant neoplasm: Secondary | ICD-10-CM

## 2012-08-08 NOTE — Progress Notes (Signed)
[address]       [place] [address]             [phone]          ZEREK LITSEY [facility] Medical Record #[id number] Date of Birth: 07-Aug-1932  Dr Francie Massing NP  Chief Complaint:   PostOp Follow Up Visit 02/23/2012  DATE OF DISCHARGE:  OPERATIVE REPORT  PREOPERATIVE DIAGNOSIS: Adenocarcinoma of the gastroesophageal  junction.  POSTOPERATIVE DIAGNOSIS: Adenocarcinoma of the gastroesophageal  junction.  SURGICAL PROCEDURE: Laparoscopic staging and mobilization of the  foregut endoscopically, open distal esophageal proximal gastric  resection with esophagogastroenterostomy, and placement of a feeding  jejunostomy tube.    History of Present Illness:      Patient is progressing okay, he is now completed his radiation therapy. He does note some fatigue and poor appetite. Marland Kitchen He denies any dumping symptoms. Is able to take by mouth but has no appetite. Is now using tube feedings at nighttime. Still getting chemo per Dr Melvyn Neth.  History  Smoking status  . Former Smoker -- 5.00 packs/day  . Types: Cigarettes  . Quit date: 01/11/1992  Smokeless tobacco  . Not on file       No Known Allergies  Current Outpatient Prescriptions  Medication Sig Dispense Refill  . amLODipine-benazepril (LOTREL) 10-40 MG per capsule Take 1 capsule by mouth daily.      Marland Kitchen aspirin 81 MG tablet Take 81 mg by mouth daily.      . carvedilol (COREG) 25 MG tablet Take 12.5 mg by mouth daily.       . hydrochlorothiazide 25 MG tablet Take 25 mg by mouth daily.        . Multiple Vitamin (MULTIVITAMIN) capsule Take 1 capsule by mouth daily.      . ondansetron (ZOFRAN) 8 MG tablet Ad lib.      . pantoprazole (PROTONIX) 40 MG tablet Take 1 tablet (40 mg total) by mouth 2 (two) times daily.  60 tablet  1  . pravastatin (PRAVACHOL) 10 MG tablet Take 10 mg by mouth at bedtime.      . prednisoLONE acetate (PRED FORTE) 1 % ophthalmic suspension Place 2-3  drops into the right eye at bedtime.      . prochlorperazine (COMPAZINE) 10 MG tablet Take 10 mg by mouth every 6 (six) hours as needed.      . vitamin B-12 (CYANOCOBALAMIN) 1000 MCG tablet Take 1,000 mcg by mouth daily.      . vitamin C (ASCORBIC ACID) 500 MG tablet Take 500 mg by mouth 2 (two) times daily.       No current facility-administered medications for this visit.   Wt Readings from Last 3 Encounters:  08/08/12 141 lb (63.957 kg)  07/11/12 136 lb (61.689 kg)  06/13/12 145 lb (65.772 kg)      Physical Exam: BP 120/69  Pulse 78  Resp 18  Ht 5\' 6"  (1.676 m)  Wt 141 lb (63.957 kg)  BMI 22.77 kg/m2  SpO2 94% Wt Readings from Last 3 Encounters:  08/08/12 141 lb (63.957 kg)  07/11/12 136 lb (61.689 kg)  06/13/12 145 lb (65.772 kg)    General appearance: alert and cooperative Neurologic: intact Heart: regular rate and rhythm, S1, S2 normal, no murmur, click, rub or gallop and normal apical impulse Lungs: clear to auscultation bilaterally and normal  percussion bilaterally Abdomen: soft, non-tender; bowel sounds normal; no masses,  no organomegaly Extremities: extremities normal, atraumatic, no cyanosis or edema, Homans sign is negative, no sign of DVT and no edema, redness or tenderness in the calves or thighs Wound: wounds healing well ` Diagnostic Studies & Laboratory data:         Recent Radiology Findings: No results found.    Recent Labs: Lab Results  Component Value Date   WBC 5.9 03/19/2012   HGB 10.8* 03/19/2012   HCT 32.4* 03/19/2012   PLT 222 03/19/2012   GLUCOSE 109* 03/05/2012   CHOL 107 03/04/2012   TRIG 85 03/04/2012   ALT 50 03/04/2012   AST 21 03/04/2012   NA 139 03/05/2012   K 4.1 03/05/2012   CL 104 03/05/2012   CREATININE 0.55 03/05/2012   BUN 12 03/05/2012   CO2 28 03/05/2012   INR 1.14 02/22/2012      Assessment / Plan:   Resected GE  junction adeno carcinoma Stage IIB (pT3,pN1,cM0) Stable post op but has had 10lb wt loss over  several months with treatments since 3 weeks gained 4 lbs will continue night time tube feeding to supplement  Will see in two months  Mont Jagoda B 08/08/2012 11:23 AM

## 2012-08-16 ENCOUNTER — Encounter (INDEPENDENT_AMBULATORY_CARE_PROVIDER_SITE_OTHER): Payer: Medicare HMO | Admitting: Surgery

## 2012-10-10 ENCOUNTER — Ambulatory Visit: Payer: Medicare HMO | Admitting: Cardiothoracic Surgery

## 2012-10-28 ENCOUNTER — Other Ambulatory Visit: Payer: Self-pay | Admitting: *Deleted

## 2012-10-31 ENCOUNTER — Ambulatory Visit (INDEPENDENT_AMBULATORY_CARE_PROVIDER_SITE_OTHER): Payer: Medicare HMO | Admitting: Cardiothoracic Surgery

## 2012-10-31 ENCOUNTER — Other Ambulatory Visit: Payer: Self-pay | Admitting: *Deleted

## 2012-10-31 ENCOUNTER — Ambulatory Visit
Admission: RE | Admit: 2012-10-31 | Discharge: 2012-10-31 | Disposition: A | Discharge status: Home / Self Care | Payer: Medicare HMO | Source: Ambulatory Visit | Attending: Cardiothoracic Surgery | Admitting: Cardiothoracic Surgery

## 2012-10-31 ENCOUNTER — Encounter: Payer: Self-pay | Admitting: Cardiothoracic Surgery

## 2012-10-31 VITALS — BP 135/68 | HR 62 | Resp 16 | Ht 66.0 in | Wt 136.6 lb

## 2012-10-31 DIAGNOSIS — C159 Malignant neoplasm of esophagus, unspecified: Secondary | ICD-10-CM

## 2012-10-31 DIAGNOSIS — Z9889 Other specified postprocedural states: Secondary | ICD-10-CM

## 2012-10-31 DIAGNOSIS — Z903 Acquired absence of stomach [part of]: Secondary | ICD-10-CM

## 2012-10-31 DIAGNOSIS — Z934 Other artificial openings of gastrointestinal tract status: Secondary | ICD-10-CM

## 2012-10-31 DIAGNOSIS — C160 Malignant neoplasm of cardia: Secondary | ICD-10-CM

## 2012-10-31 NOTE — Progress Notes (Signed)
[address]       [place] [address]             [phone]                   [name] [facility] Medical Record #[id number] Date of Birth: 12/12/1932  Dr Francie Massing NP  Chief Complaint:   PostOp Follow Up Visit 02/23/2012  DATE OF DISCHARGE:  OPERATIVE REPORT  PREOPERATIVE DIAGNOSIS: Adenocarcinoma of the gastroesophageal  junction.  POSTOPERATIVE DIAGNOSIS: Adenocarcinoma of the gastroesophageal  junction.  SURGICAL PROCEDURE: Laparoscopic staging and mobilization of the  foregut endoscopically, open distal esophageal proximal gastric  resection with esophagogastroenterostomy, and placement of a feeding  jejunostomy tube.    History of Present Illness:     Patient returns for followup visit after resection of adenocarcinoma of the GE junction October 2013. Since that time he is now completed course of radiation therapy and chemotherapy. He is able to take a regular diet, but still with some limitations requiring nighttime tube feedings. 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  He notes that his feeding jejunostomy tube fell out several weeks ago but was replaced without difficulty.  He is to see Dr. Charm Barges in [place] in the coming several weeks to consider repeat endoscopy.    History  Smoking status  . Former Smoker -- 5.00 packs/day  . Types: Cigarettes  . Quit date: 01/11/1992  Smokeless tobacco  . Not on file       No Known Allergies  Current Outpatient Prescriptions  Medication Sig Dispense Refill  . amLODipine-benazepril (LOTREL) 10-40 MG per capsule Take 1 capsule by mouth daily.      Marland Kitchen aspirin 81 MG tablet Take 81 mg by mouth daily.      . carvedilol (COREG) 25 MG tablet Take 12.5 mg by mouth daily.       . Feeding Tubes - Pump MISC by Does not apply route. NUTREN 1.5 HC VAN 250 X 2 CARTONS  INFUSED OVERNIGHT      . hydrochlorothiazide 25 MG tablet Take 25 mg by mouth daily.        . Multiple Vitamin (MULTIVITAMIN) capsule Take 1 capsule by mouth daily.      . ondansetron (ZOFRAN) 8 MG tablet Ad lib.      . pantoprazole (PROTONIX) 40 MG tablet Take 1 tablet (40 mg total) by mouth 2 (two) times daily.  60 tablet  1  . pravastatin (PRAVACHOL) 10 MG tablet Take 10 mg by mouth at bedtime.      . prednisoLONE acetate (PRED FORTE) 1 % ophthalmic suspension Place 2-3 drops into the right eye at bedtime.      . vitamin B-12 (CYANOCOBALAMIN) 1000 MCG tablet Take 1,000 mcg by mouth daily.       No current facility-administered medications for this visit.   Wt Readings from Last 3 Encounters:  10/31/12 136 lb 9.6 oz (61.961 kg)  08/08/12 141 lb (63.957 kg)  07/11/12 136 lb (61.689 kg)      Physical Exam: BP 135/68  Pulse 62  Resp 16  Ht 5\' 6"  (1.676 m)  Wt 136 lb 9.6 oz (61.961 kg)  BMI 22.06 kg/m2  SpO2 96% Wt Readings from Last 3 Encounters:  10/31/12 136 lb 9.6 oz (61.961 kg)  08/08/12 141 lb (63.957 kg)  07/11/12 136 lb (61.689 kg)     General appearance: alert and cooperative Neurologic: intact Heart: regular rate and rhythm, S1, S2 normal, no murmur, click, rub or gallop and normal apical impulse Lungs: clear to auscultation bilaterally and normal percussion bilaterally Abdomen: soft, non-tender; bowel sounds normal; no masses,  no organomegaly Extremities: extremities normal, atraumatic, no cyanosis or edema, Homans sign is negative, no sign of DVT and no edema, redness or tenderness in the calves or thighs Wound: wounds healing well `j tube clean and dry  Diagnostic Studies & Laboratory data:         Recent Radiology Findings: Clinical Data: Esophageal cancer  CT ABDOMEN AND PELVIS WITH CONTRAST  Technique: Multidetector CT imaging of the abdomen and pelvis was performed following the standard protocol during bolus administration of intravenous  contrast.  Contrast: 100 cc Isovue 370.  Comparison: 02/14/2012  Findings: Small focus of new tree in bud opacity at the right base may be related to infection or inflammation. Aspiration could also cause this appearance.  The patient has had interval surgery in the region of the esophagogastric junction. Suture line is seen in the region of the gastric cardia of the suture line is seen in the distal esophagus. There is some wall thickening in the region of the esophagogastric junction on the current study.  Tiny liver lesions are again noted and are stable. These are too small to characterize the interval stability is reassuring. Cavernous transformation of the porta hepatis is again noted. Spleen is unremarkable. Duodenum, adrenal glands, and kidneys are unremarkable. Pancreas is atrophic.  No abdominal aortic aneurysm. Surgical J tube is noted. No free fluid or lymphadenopathy in the abdomen.  Imaging through the pelvis shows no free intraperitoneal fluid. There is no pelvic sidewall lymphadenopathy. Prostate gland is enlarged with a TURP defect visible. Bladder is decompressed. No colonic diverticulitis. Terminal ileum is normal.  The tip of the appendix is distended up to 14 mm in diameter and is filled with fluid attenuation material. It does not opacify with contrast.  Bone windows reveal no worrisome lytic or sclerotic osseous lesions.  IMPRESSION: Interval surgery at the esophagogastric junction. There is circumferential wall thickening in this region which may be postsurgical, but close attention is recommended.  Stable appearance of scattered tiny low-density liver lesions.  Cavernous transformation of the porta hepatis.  Interval development of a distended appendiceal tip with fluid attenuation material in the lumen. Acute appendicitis cannot be excluded. Mucocele of the appendix or mucinous appendiceal neoplasm could also produce this appearance. The  appendix did have normal features on the prior study.   Original Report Authenticated By: Kennith Center, M.D.    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

## 2012-11-28 ENCOUNTER — Encounter (INDEPENDENT_AMBULATORY_CARE_PROVIDER_SITE_OTHER): Payer: Self-pay

## 2013-02-27 ENCOUNTER — Ambulatory Visit: Payer: Medicare HMO | Admitting: Cardiothoracic Surgery

## 2013-03-06 ENCOUNTER — Ambulatory Visit (INDEPENDENT_AMBULATORY_CARE_PROVIDER_SITE_OTHER): Payer: Medicare HMO | Admitting: Cardiothoracic Surgery

## 2013-03-06 ENCOUNTER — Ambulatory Visit: Payer: Medicare HMO | Admitting: Cardiothoracic Surgery

## 2013-03-06 ENCOUNTER — Encounter: Payer: Self-pay | Admitting: Cardiothoracic Surgery

## 2013-03-06 VITALS — BP 126/70 | HR 70 | Resp 16 | Ht 66.0 in | Wt 132.0 lb

## 2013-03-06 DIAGNOSIS — C160 Malignant neoplasm of cardia: Secondary | ICD-10-CM

## 2013-03-06 DIAGNOSIS — Z09 Encounter for follow-up examination after completed treatment for conditions other than malignant neoplasm: Secondary | ICD-10-CM

## 2013-03-06 NOTE — Progress Notes (Signed)
[address]       [place] [address]             [phone]                   [name] [facility] Medical Record #[id number] Date of Birth: 07/27/1932  Dr Francie Massing NP  Chief Complaint:   PostOp Follow Up Visit 02/23/2012  OPERATIVE REPORT  PREOPERATIVE DIAGNOSIS: Adenocarcinoma of the gastroesophageal  junction.  POSTOPERATIVE DIAGNOSIS: Adenocarcinoma of the gastroesophageal  junction.  SURGICAL PROCEDURE: Laparoscopic staging and mobilization of the  foregut endoscopically, open distal esophageal proximal gastric  resection with esophagogastroenterostomy, and placement of a feeding  jejunostomy tube.    History of Present Illness:     Patient returns for followup visit after resection of adenocarcinoma of the GE junction October 2013. Since that time he is now completed course of radiation therapy and chemotherapy.  Patient continues to do well from a clinical standpoint. He has not been able to gain weight, but remains active and taking a diet well. He's had no obstructive symptoms.    History  Smoking status  . Former Smoker -- 5.00 packs/day  . Types: Cigarettes  . Quit date: 01/11/1992  Smokeless tobacco  . Not on file       No Known Allergies  Current Outpatient Prescriptions  Medication Sig Dispense Refill  . amLODipine-benazepril (LOTREL) 10-40 MG per capsule Take 1 capsule by mouth daily.      Marland Kitchen aspirin 81 MG tablet Take 81 mg by mouth daily.      . hydrochlorothiazide 25 MG tablet Take 25 mg by mouth daily.        . Multiple Vitamin (MULTIVITAMIN) capsule Take 1 capsule by mouth daily.      . pantoprazole (PROTONIX) 40 MG tablet Take 1 tablet (40 mg total) by mouth 2 (two) times daily.  60 tablet  1  . pravastatin (PRAVACHOL) 10 MG tablet Take 10 mg by mouth at bedtime.      . prednisoLONE acetate (PRED FORTE) 1 % ophthalmic suspension Place 2-3 drops into the right eye at bedtime.      . vitamin B-12  (CYANOCOBALAMIN) 1000 MCG tablet Take 1,000 mcg by mouth daily.      . carvedilol (COREG) 25 MG tablet Take 12.5 mg by mouth daily.        No current facility-administered medications for this visit.   Wt Readings from Last 3 Encounters:  03/06/13 132 lb (59.875 kg)  10/31/12 136 lb 9.6 oz (61.961 kg)  08/08/12 141 lb (63.957 kg)  01/03/2013        132 lb    Physical Exam: BP 126/70  Pulse 70  Resp 16  Ht 5\' 6"  (1.676 m)  Wt 132 lb (59.875 kg)  BMI 21.32 kg/m2  SpO2 97%    General appearance: alert and cooperative Neurologic: intact Heart: regular rate and rhythm, S1, S2 normal, no murmur, click, rub or gallop and normal apical impulse Lungs: clear to auscultation bilaterally and normal percussion bilaterally Abdomen: soft, non-tender; bowel sounds normal; no masses,  no organomegaly Extremities: extremities normal, atraumatic, no cyanosis or edema, Homans sign is negative, no sign of DVT and no edema, redness or tenderness in the calves or thighs Wound: wounds healing well   Diagnostic Studies & Laboratory data:         Recent Radiology Findings: RADIOLOGY REPORT*  Clinical Data: History of esophageal  cancer.  CT ABDOMEN AND PELVIS WITH CONTRAST  Technique: Multidetector CT imaging of the abdomen and pelvis was performed following the standard protocol during bolus administration of intravenous contrast.  Contrast: 100 ml Isovue 370  Comparison: CT abdomen pelvis 10/02/2012  Findings: Interval development of a 1.0 cm nodule within the left lower lobe (image 6; series 4). No pleural effusion or pneumothorax.  Redemonstrated subcentimeter low attenuation lesions in the hepatic dome, unchanged from prior examination. No new hepatic lesions are identified. Persistent cavernous transformation of the portal vein. Redemonstrated pancreatic atrophy. The gallbladder and bilateral adrenal glands are unremarkable. Kidneys enhance symmetrically with contrast.  Normal  caliber abdominal aorta with calcified atherosclerotic plaque. No retroperitoneal adenopathy. Urinary bladder is grossly unremarkable. TURP defect redemonstrated within the prostate.  Oral contrast material is demonstrated throughout the small and large bowel. No abnormal bowel wall thickening. No free fluid or free intraperitoneal air. Redemonstrated distension of the distal aspect of the appendix measuring up to 1.4 cm in diameter, concerning fluid attenuation material. No periappendiceal fat stranding is identified.  Redemonstrated postsurgical change at the GE junction with some wall thickening associated with the central line. This appearance is grossly similar to recent prior examination.  Lower lumbar spine degenerative change. No aggressive appearing osseous lesions.  IMPRESSION: 1. Stable appearance of the postsurgical GE junction with associated wall thickening at the level of the suture line. This is likely postoperative in etiology however attention on follow-up is recommended. 2. Interval development of a 1.0 cm nodular opacity within the left lower lobe. While this may potentially be infectious/inflammatory etiology, metastatic disease is not excluded. 3. Unchanged sub-centimeter low attenuation liver lesions. 4. Unchanged distended appendiceal tip. Considerations include mucocele of the appendix or potentially mucinous appendiceal neoplasm.   Original Report Authenticated By: Annia Belt, M.D    Recent Labs: Lab Results  Component Value Date   WBC 5.9 03/19/2012   HGB 10.8* 03/19/2012   HCT 32.4* 03/19/2012   PLT 222 03/19/2012   GLUCOSE 109* 03/05/2012   CHOL 107 03/04/2012   TRIG 85 03/04/2012   ALT 50 03/04/2012   AST 21 03/04/2012   NA 139 03/05/2012   K 4.1 03/05/2012   CL 104 03/05/2012   CREATININE 0.55 03/05/2012   BUN 12 03/05/2012   CO2 28 03/05/2012   INR 1.14 02/22/2012      Assessment / Plan:   Resected GE  junction adeno  carcinoma Stage IIB (pT3,pN1,cM0) now completed postop radiation and chemo therapy Doing well clinically CT scan of the abdomen does show a new approximately 1 cm left lower lung mass, inflammatory versus possible metastatic disease. I recommended to the patient that we obtain a followup full CT scan of the chest and abdomen 3 months after the previous scan, mid to late December This would be both to evaluate for other potential pulmonary nodules and also to evaluate the left lower lobe lung nodule for change in size. We'll coordinate this with Dr. Rennis Harding office   Loyed Wilmes B 03/06/2013 2:18 PM

## 2013-04-24 ENCOUNTER — Ambulatory Visit (INDEPENDENT_AMBULATORY_CARE_PROVIDER_SITE_OTHER): Payer: Medicare HMO | Admitting: Cardiothoracic Surgery

## 2013-04-24 ENCOUNTER — Encounter: Payer: Self-pay | Admitting: Cardiothoracic Surgery

## 2013-04-24 VITALS — BP 144/74 | HR 69 | Resp 16 | Ht 66.0 in | Wt 137.5 lb

## 2013-04-24 DIAGNOSIS — C160 Malignant neoplasm of cardia: Secondary | ICD-10-CM

## 2013-04-24 DIAGNOSIS — Z09 Encounter for follow-up examination after completed treatment for conditions other than malignant neoplasm: Secondary | ICD-10-CM

## 2013-04-24 NOTE — Progress Notes (Signed)
[name] [facility] Medical Record #[id number] Date of Birth: [birth year]  Dr Francie Massing NP  Chief Complaint:   PostOp Follow Up Visit 02/23/2012  OPERATIVE REPORT  PREOPERATIVE DIAGNOSIS: Adenocarcinoma of the gastroesophageal  junction.  POSTOPERATIVE DIAGNOSIS: Adenocarcinoma of the gastroesophageal  junction.  SURGICAL PROCEDURE: Laparoscopic staging and mobilization of the  foregut endoscopically, open distal esophageal proximal gastric  resection with esophagogastroenterostomy, and placement of a feeding  jejunostomy tube.    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 Since that time he is now completed course of radiation therapy and chemotherapy.  Patient continues to do well from a clinical standpoint. He has not been able to gain weight, but remains active and taking a diet well. He's had no obstructive symptoms.  He's had a recent followup CT scan of the chest    History  Smoking status  . Former Smoker -- 5.00 packs/day  . Types: Cigarettes  . Quit date: 01/11/1992  Smokeless tobacco  . Not on file       No Known Allergies  Current Outpatient Prescriptions  Medication Sig Dispense Refill  . amLODipine-benazepril (LOTREL) 10-40 MG per capsule Take 1 capsule by mouth daily.      Marland Kitchen aspirin 81 MG tablet Take 81 mg by mouth daily.      . carvedilol (COREG) 25 MG tablet Take 12.5 mg by mouth daily.       . hydrochlorothiazide 25 MG tablet Take 25 mg by mouth daily.        . Multiple Vitamin (MULTIVITAMIN) capsule Take 1 capsule by mouth daily.      . pantoprazole (PROTONIX) 40 MG tablet Take 1 tablet (40 mg total) by mouth 2 (two) times daily.  60 tablet  1  . pravastatin (PRAVACHOL) 10 MG tablet Take 10 mg by mouth at bedtime.      . prednisoLONE acetate (PRED FORTE) 1 % ophthalmic suspension Place 2-3  drops into the right eye at bedtime.      . vitamin B-12 (CYANOCOBALAMIN) 1000 MCG tablet Take 1,000 mcg by mouth daily.       No current facility-administered medications for this visit.   Wt Readings from Last 3 Encounters:  04/24/13 137 lb 8 oz (62.37 kg)  03/06/13 132 lb (59.875 kg)  10/31/12 136 lb 9.6 oz (61.961 kg)  01/03/2013        132 lb    Physical Exam: BP 144/74  Pulse 69  Resp 16  Ht 5\' 6"  (1.676 m)  Wt 137 lb 8 oz (62.37 kg)  BMI 22.20 kg/m2  SpO2 93%    General appearance: alert and cooperative Neurologic: intact Heart: regular rate and rhythm, S1, S2 normal, no murmur, click, rub or gallop and normal apical impulse Lungs: clear to auscultation bilaterally and normal percussion bilaterally Abdomen: soft, non-tender; bowel sounds normal; no masses,  no organomegaly Extremities: extremities normal, atraumatic, no cyanosis or edema, Homans sign is negative, no sign of DVT and no edema, redness or tenderness in the calves or thighs Wound: wounds healing well I do not appreciate any cervical supraclavicular or axillary adenopathy,  Diagnostic Studies & Laboratory data:         Recent Radiology Findings: CLINICAL DATA: Esophageal cancer. Restaging exam. History of surgery and radio chemotherapy.  EXAM:04/21/2013 CT CHEST, ABDOMEN, AND PELVIS WITH  CONTRAST  TECHNIQUE: Multidetector CT imaging of the chest, abdomen and pelvis was performed following the standard protocol during bolus administration of intravenous contrast.  CONTRAST: 100 mL Isovue  COMPARISON: CT 02/04/2013  FINDINGS: CT CHEST FINDINGS  There is a port in the right anterior chest wall. No axillary supraclavicular lymphadenopathy. Nodule enlargement of the right lobe of thyroid gland. Left lobe is surgically absent. Thyroid enlargement is unchanged from prior PET-CT. Review of the lung parenchyma demonstrates a 7 mm pulmonary nodule in the inferior lingula (image 39). This region was  not imaged on the more recent CT exam of the abdomen pelvis. This region was included on the PET-CT scan and was not clearly evident on that exam. At the left lung base are is a cluster branching peripheral nodules largest measuring 10 mm (image 46) which the largest is not increased in size but a smaller nodules are new. Small peripheral branching nodule in the lateral right upper lobe is also not imaged (image 34).  CT ABDOMEN AND PELVIS FINDINGS  The distal esophagus is normal. There is surgical resection margin of the esophagus. No adenopathy in the gastrohepatic ligament. Stomach, small bowel, and colon are unremarkable.  A tiny hypodensity in the right hepatic lobe (image 50) which is unchanged from prior. There is partial left hepatectomy. The spleen, adrenal glands, kidneys are normal.  No free fluid the pelvis. The prostate gland and bladder normal. No retroperitoneal lymphadenopathy. Insert negative then  IMPRESSION: 1. A 7 mm nodule in the lingula appears new from prior PET-CT scan. Recommend short interval CT follow-up the thorax with contrast (1 to 3 months). 2. Increased nodularity of irregular nodule at the left lung base is also indeterminate. Recommend close attention on follow-up. 3. Postsurgical change in distal esophagus and liver without evidence of local recurrence.   Electronically Signed By: Genevive Bi M.D. On: 04/21/2013 14:03   RADIOLOGY REPORT*  Clinical Data: History of esophageal cancer.  02/04/2013 CT ABDOMEN AND PELVIS WITH CONTRAST  Technique: Multidetector CT imaging of the abdomen and pelvis was performed following the standard protocol during bolus administration of intravenous contrast.  Contrast: 100 ml Isovue 370  Comparison: CT abdomen pelvis 10/02/2012  Findings: Interval development of a 1.0 cm nodule within the left lower lobe (image 6; series 4). No pleural effusion or pneumothorax.  Redemonstrated subcentimeter low  attenuation lesions in the hepatic dome, unchanged from prior examination. No new hepatic lesions are identified. Persistent cavernous transformation of the portal vein. Redemonstrated pancreatic atrophy. The gallbladder and bilateral adrenal glands are unremarkable. Kidneys enhance symmetrically with contrast.  Normal caliber abdominal aorta with calcified atherosclerotic plaque. No retroperitoneal adenopathy. Urinary bladder is grossly unremarkable. TURP defect redemonstrated within the prostate.  Oral contrast material is demonstrated throughout the small and large bowel. No abnormal bowel wall thickening. No free fluid or free intraperitoneal air. Redemonstrated distension of the distal aspect of the appendix measuring up to 1.4 cm in diameter, concerning fluid attenuation material. No periappendiceal fat stranding is identified.  Redemonstrated postsurgical change at the GE junction with some wall thickening associated with the central line. This appearance is grossly similar to recent prior examination.  Lower lumbar spine degenerative change. No aggressive appearing osseous lesions.  IMPRESSION: 1. Stable appearance of the postsurgical GE junction with associated wall thickening at the level of the suture line. This is likely postoperative in etiology however attention on follow-up is recommended. 2. Interval development of a 1.0 cm nodular opacity within the left lower lobe. While  this may potentially be infectious/inflammatory etiology, metastatic disease is not excluded. 3. Unchanged sub-centimeter low attenuation liver lesions. 4. Unchanged distended appendiceal tip. Considerations include mucocele of the appendix or potentially mucinous appendiceal neoplasm.   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 Melvyn Neth has a followup CT scan arranged for approximately 3 months from now. We'll plan on seeing the patient following the followup CT scan.   Jayleah Garbers B 04/24/2013 1:29 PM

## 2013-08-28 ENCOUNTER — Encounter: Payer: Self-pay | Admitting: Cardiothoracic Surgery

## 2013-08-28 ENCOUNTER — Ambulatory Visit (INDEPENDENT_AMBULATORY_CARE_PROVIDER_SITE_OTHER): Payer: Medicare HMO | Admitting: Cardiothoracic Surgery

## 2013-08-28 VITALS — BP 160/85 | HR 62 | Resp 20 | Ht 66.0 in | Wt 136.0 lb

## 2013-08-28 DIAGNOSIS — C160 Malignant neoplasm of cardia: Secondary | ICD-10-CM

## 2013-08-28 NOTE — Progress Notes (Signed)
[address] [address]       [name],[name]             [phone]         [name] [facility] Medical Record #[id number] Date of Birth: January 28, 1933  Dr Nicholas Fellers NP  Chief Complaint:   PostOp Follow Up Visit 02/23/2012  OPERATIVE REPORT  PREOPERATIVE DIAGNOSIS: Adenocarcinoma of the gastroesophageal  junction.  POSTOPERATIVE DIAGNOSIS: Adenocarcinoma of the gastroesophageal  junction.  SURGICAL PROCEDURE: Laparoscopic staging and mobilization of the  foregut endoscopically, open distal esophageal proximal gastric  resection with esophagogastroenterostomy, and placement of a feeding  jejunostomy tube.    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 He  completed course of radiation therapy and chemotherapy post operatively Patient continues to do well from a clinical standpoint. He has not been able to gain weight, but remains active and taking a diet well. He has no swallowing difficulties. Denies any dumping symptoms  He's had a recent followup CT scan of the chest yesterday at [facility]    History  Smoking status  . Former Smoker -- 5.00 packs/day  . Types: Cigarettes  . Quit date: 01/11/1992  Smokeless tobacco  . Not on file       No Known Allergies  Current Outpatient Prescriptions  Medication Sig Dispense Refill  . amLODipine-benazepril (LOTREL) 10-40 MG per capsule Take 1 capsule by mouth daily.      Marland Kitchen aspirin 81 MG tablet Take 81 mg by mouth daily.      . carvedilol (COREG) 25 MG tablet Take 12.5 mg by mouth daily.       . hydrochlorothiazide 25 MG tablet Take 25 mg by mouth daily.        . Multiple Vitamin (MULTIVITAMIN) capsule Take 1 capsule by mouth daily.      . pantoprazole (PROTONIX) 40 MG tablet Take 1 tablet (40 mg total) by mouth 2 (two) times daily.  60 tablet  1  .  pravastatin (PRAVACHOL) 10 MG tablet Take 10 mg by mouth at bedtime.      . prednisoLONE acetate (PRED FORTE) 1 % ophthalmic suspension Place 2-3 drops into the right eye at bedtime.      . vitamin B-12 (CYANOCOBALAMIN) 1000 MCG tablet Take 1,000 mcg by mouth daily.       No current facility-administered medications for this visit.   Wt Readings from Last 3 Encounters:  08/28/13 136 lb (61.689 kg)  04/24/13 137 lb 8 oz (62.37 kg)  03/06/13 132 lb (59.875 kg)  01/03/2013        132 lb    Physical Exam: BP 160/85  Pulse 62  Resp 20  Ht 5\' 6"  (1.676 m)  Wt 136 lb (61.689 kg)  BMI 21.96 kg/m2  SpO2 95%    General appearance: alert and cooperative Neurologic: intact Heart: regular rate and rhythm, S1, S2 normal, no murmur, click, rub or gallop and normal apical impulse Lungs: clear to auscultation bilaterally and normal percussion bilaterally Abdomen: soft, non-tender; bowel sounds normal; no masses,  no organomegaly Extremities: extremities normal, atraumatic, no cyanosis or edema, Homans sign is negative, no sign of DVT and no edema, redness or tenderness in the calves or thighs Wound: wounds healing well I do not appreciate any cervical supraclavicular or axillary adenopathy, The patient has a  1 cm soft nodular skin lesion on the left lower flank, he notes that this is "new".   Diagnostic Studies & Laboratory data:         Recent Radiology Findings: CT CHEST, ABDOMEN, AND PELVIS WITH CONTRAST  TECHNIQUE: Multidetector CT imaging of the chest, abdomen and pelvis was performed following the standard protocol during bolus administration of intravenous contrast.  CONTRAST: 100 cc Isovue 370.  COMPARISON: 04/21/2013  FINDINGS: CT CHEST FINDINGS  The tip of the right-sided Port-A-Cath is positioned in the mid SVC. There is no axillary lymphadenopathy. No mediastinal or hilar lymphadenopathy. Fluid in the midesophagus may be related to reflux or dysmotility. Anastomotic  suture line identified in the distal esophagus with another suture line noted in the proximal stomach.  3.4 x 2.4 cm right thyroid lesion measures slightly smaller on today's study which may be secondary to bolus timing.  Heart size is stable. No pericardial or pleural effusion.  Lung windows reveal underlying emphysema. Scattered nodularity is seen in the right upper lobe. The clustered small nodules in the anteromedial right upper lobe are stable. Areas of clustered nodules in the right middle lobe (image 33 of series 4) are stable.  The 7 mm lingular nodule seen on the previous study is stable.  New clustered areas of nodularity are seen in the right lower lobe on today's study (see images 46 scan 68). Tiny nodules seen in the posterior left lower lobe on the previous study have almost completely resolved in the interval (see image 49).  Bone windows reveal no worrisome lytic or sclerotic osseous lesions.  CT ABDOMEN AND PELVIS FINDINGS  Patient is status post left hepatectomy. No focal abnormality is seen in the remaining portion of the liver. Spleen is unremarkable. 14 mm low-density nodule associated with a suture line in the mid stomach is unchanged in the interval. Duodenum, pancreas, gallbladder, and adrenal glands are unremarkable. Cortical scarring with decreased perfusion in the upper pole the left kidney is stable. Tiny interpolar left renal cysts noted. No enhancing renal mass seen in either kidney.  No lymphadenopathy or free fluid in the abdomen.  Imaging through the pelvis shows no free intraperitoneal fluid. There is no pelvic sidewall lymphadenopathy. Bladder is unremarkable. Prostate gland is enlarged. No colonic diverticulitis terminal ileum is normal. The appendix is not visualized, but there is no edema or inflammation in the region of the cecum. Fat show laxity in the right lower quadrant is evident without an overt inguinal hernia.  Bone windows  reveal no worrisome lytic or sclerotic osseous lesions.  IMPRESSION: Scattered areas of nodularity in both lungs, some of which are stable, some of which are improved, and some of which are new. A new areas of clustered nodularity are very similar to the existing areas. This may all represent sequelae of infectious/inflammatory process but continued close attention is recommended as metastatic disease in the new areas cannot be completely excluded.  Postsurgical changes in the stomach and liver without evidence for metastatic disease in the abdomen or pelvis.   Electronically Signed By: Misty Stanley M.D. On: 08/27/2013 12:06    CLINICAL DATA: Esophageal cancer. Restaging exam. History of surgery and radio chemotherapy.  EXAM:04/21/2013 CT CHEST, ABDOMEN, AND PELVIS WITH CONTRAST  TECHNIQUE: Multidetector CT imaging of the chest, abdomen and pelvis was performed following the standard protocol during bolus administration of intravenous contrast.  CONTRAST: 100 mL Isovue  COMPARISON: CT 02/04/2013  FINDINGS: CT CHEST FINDINGS  There is a port in the right  anterior chest wall. No axillary supraclavicular lymphadenopathy. Nodule enlargement of the right lobe of thyroid gland. Left lobe is surgically absent. Thyroid enlargement is unchanged from prior PET-CT. Review of the lung parenchyma demonstrates a 7 mm pulmonary nodule in the inferior lingula (image 39). This region was not imaged on the more recent CT exam of the abdomen pelvis. This region was included on the PET-CT scan and was not clearly evident on that exam. At the left lung base are is a cluster branching peripheral nodules largest measuring 10 mm (image 46) which the largest is not increased in size but a smaller nodules are new. Small peripheral branching nodule in the lateral right upper lobe is also not imaged (image 34).  CT ABDOMEN AND PELVIS FINDINGS  The distal esophagus is normal. There is  surgical resection margin of the esophagus. No adenopathy in the gastrohepatic ligament. Stomach, small bowel, and colon are unremarkable.  A tiny hypodensity in the right hepatic lobe (image 50) which is unchanged from prior. There is partial left hepatectomy. The spleen, adrenal glands, kidneys are normal.  No free fluid the pelvis. The prostate gland and bladder normal. No retroperitoneal lymphadenopathy. Insert negative then  IMPRESSION: 1. A 7 mm nodule in the lingula appears new from prior PET-CT scan. Recommend short interval CT follow-up the thorax with contrast (1 to 3 months). 2. Increased nodularity of irregular nodule at the left lung base is also indeterminate. Recommend close attention on follow-up. 3. Postsurgical change in distal esophagus and liver without evidence of local recurrence.   Electronically Signed By: Suzy Bouchard M.D. On: 04/21/2013 14:03   RADIOLOGY REPORT*  Clinical Data: History of esophageal cancer.  02/04/2013 CT ABDOMEN AND PELVIS WITH CONTRAST  Technique: Multidetector CT imaging of the abdomen and pelvis was performed following the standard protocol during bolus administration of intravenous contrast.  Contrast: 100 ml Isovue 370  Comparison: CT abdomen pelvis 10/02/2012  Findings: Interval development of a 1.0 cm nodule within the left lower lobe (image 6; series 4). No pleural effusion or pneumothorax.  Redemonstrated subcentimeter low attenuation lesions in the hepatic dome, unchanged from prior examination. No new hepatic lesions are identified. Persistent cavernous transformation of the portal vein. Redemonstrated pancreatic atrophy. The gallbladder and bilateral adrenal glands are unremarkable. Kidneys enhance symmetrically with contrast.  Normal caliber abdominal aorta with calcified atherosclerotic plaque. No retroperitoneal adenopathy. Urinary bladder is grossly unremarkable. TURP defect redemonstrated within the  prostate.  Oral contrast material is demonstrated throughout the small and large bowel. No abnormal bowel wall thickening. No free fluid or free intraperitoneal air. Redemonstrated distension of the distal aspect of the appendix measuring up to 1.4 cm in diameter, concerning fluid attenuation material. No periappendiceal fat stranding is identified.  Redemonstrated postsurgical change at the GE junction with some wall thickening associated with the central line. This appearance is grossly similar to recent prior examination.  Lower lumbar spine degenerative change. No aggressive appearing osseous lesions.  IMPRESSION: 1. Stable appearance of the postsurgical GE junction with associated wall thickening at the level of the suture line. This is likely postoperative in etiology however attention on follow-up is recommended. 2. Interval development of a 1.0 cm nodular opacity within the left lower lobe. While this may potentially be infectious/inflammatory etiology, metastatic disease is not excluded. 3. Unchanged sub-centimeter low attenuation liver lesions. 4. Unchanged distended appendiceal tip. Considerations include mucocele of the appendix or potentially mucinous appendiceal neoplasm.   Original Report Authenticated By: Lovey Newcomer, M.D  Recent Labs: Lab Results  Component Value Date   WBC 5.9 03/19/2012   HGB 10.8* 03/19/2012   HCT 32.4* 03/19/2012   PLT 222 03/19/2012   GLUCOSE 109* 03/05/2012   CHOL 107 03/04/2012   TRIG 85 03/04/2012   ALT 50 03/04/2012   AST 21 03/04/2012   NA 139 03/05/2012   K 4.1 03/05/2012   CL 104 03/05/2012   CREATININE 0.55 03/05/2012   BUN 12 03/05/2012   CO2 28 03/05/2012   INR 1.14 02/22/2012      Assessment / Plan:   Resected GE  junction adeno carcinoma Stage IIB (pT3,pN1,cM0) now completed postop radiation and chemo therapy Doing well clinically CT scan of the chest and abdomen/pelvis  Waxing and waining nodular areas  and some stable area, no obvious metastatic disease  But needs follow up scans to determine Oncology in [place]/Dr. Bobby Rumpf has been arranging followup scans. I recommended to the patient that he see a general surgeon in [place] and have the left subcutaneous nodular area of the left flank excised and checked for pathology. Patient has an appointment with Dr. Bobby Rumpf tomorrow. Plan see the patient back in 6 months  Grace Isaac 08/28/2013 12:39 PM

## 2014-03-05 ENCOUNTER — Ambulatory Visit (INDEPENDENT_AMBULATORY_CARE_PROVIDER_SITE_OTHER): Payer: Medicare HMO | Admitting: Cardiothoracic Surgery

## 2014-03-05 ENCOUNTER — Encounter: Payer: Self-pay | Admitting: Cardiothoracic Surgery

## 2014-03-05 VITALS — BP 133/77 | HR 66 | Ht 66.0 in | Wt 138.0 lb

## 2014-03-05 DIAGNOSIS — C160 Malignant neoplasm of cardia: Secondary | ICD-10-CM

## 2014-03-05 NOTE — Progress Notes (Signed)
[address]       [place] [address]             [id number]         Prince L Dominic  Medical Record #[id number] Date of Birth: 1933-01-20  Dr Homero Fellers NP  Chief Complaint:   PostOp Follow Up Visit 02/23/2012  OPERATIVE REPORT  PREOPERATIVE DIAGNOSIS: Adenocarcinoma of the gastroesophageal  junction.  POSTOPERATIVE DIAGNOSIS: Adenocarcinoma of the gastroesophageal  junction.  SURGICAL PROCEDURE: Laparoscopic staging and mobilization of the  foregut endoscopically, open distal esophageal proximal gastric  resection with esophagogastroenterostomy, and placement of a feeding  jejunostomy tube.   Gastric cancer   Primary site: Stomach   Staging method: AJCC 7th Edition   Pathologic: Stage IIB (T3, N1, cM0) signed by Grace Isaac, MD on 02/28/2012  8:47 AM   Summary: Stage IIB (T3, N1, cM0)  History of Present Illness:     Patient returns for followup visit after resection of adenocarcinoma of the GE junction October 2013 adeno carcinoma Stage IIB (pT3,pN1,cM0) . He  completed course of radiation therapy and chemotherapy post operatively after presenting with massive GI bleed. Patient continues to do well from a clinical standpoint. He has not been able to gain weight, but remains active and taking a diet well. He has no swallowing difficulties.  He notes that a recent endoscopy was performed biopsies were done around the anastomotic site, the patient reports that the biopsies were negative for pregnancy. A recent CT of the neck and abdomen was done at [facility].   History  Smoking status  . Former Smoker -- 5.00 packs/day  . Types: Cigarettes  . Quit date: 01/11/1992  Smokeless tobacco  . Not on file       No Known Allergies  Current Outpatient Prescriptions  Medication Sig Dispense Refill  . amLODipine-benazepril (LOTREL) 10-40 MG per capsule Take 1 capsule by mouth daily.      Marland Kitchen aspirin 81 MG tablet Take 81  mg by mouth daily.      . carvedilol (COREG) 25 MG tablet Take 12.5 mg by mouth daily.       . hydrochlorothiazide 25 MG tablet Take 25 mg by mouth daily.        . Multiple Vitamin (MULTIVITAMIN) capsule Take 1 capsule by mouth daily.      . pantoprazole (PROTONIX) 40 MG tablet Take 1 tablet (40 mg total) by mouth 2 (two) times daily.  60 tablet  1  . pravastatin (PRAVACHOL) 10 MG tablet Take 10 mg by mouth at bedtime.      . prednisoLONE acetate (PRED FORTE) 1 % ophthalmic suspension Place 2-3 drops into the right eye at bedtime.      . vitamin B-12 (CYANOCOBALAMIN) 1000 MCG tablet Take 1,000 mcg by mouth daily.       No current facility-administered medications for this visit.   Wt Readings from Last 3 Encounters:  03/05/14 138 lb (62.596 kg)  08/28/13 136 lb (61.689 kg)  04/24/13 137 lb 8 oz (62.37 kg)  01/03/2013        132 lb    Physical Exam: BP 133/77  Pulse 66  Ht 5\' 6"  (1.676 m)  Wt 138 lb (62.596 kg)  BMI 22.28 kg/m2  SpO2 97%    General appearance: alert and cooperative Neurologic: intact Heart: regular rate and rhythm, S1, S2 normal, no murmur, click, rub or gallop and normal apical impulse Lungs: clear  to auscultation bilaterally and normal percussion bilaterally Abdomen: soft, non-tender; bowel sounds normal; no masses,  no organomegaly Extremities: extremities normal, atraumatic, no cyanosis or edema, Homans sign is negative, no sign of DVT and no edema, redness or tenderness in the calves or thighs Wound: wounds healing well I do not appreciate any cervical supraclavicular or axillary adenopathy, The patient has no carotid bruits  Diagnostic Studies & Laboratory data:         Recent Radiology Findings: EXAM: CT ABDOMEN AND PELVIS WITH CONTRAST  TECHNIQUE: Multidetector CT imaging of the abdomen and pelvis was performed using the standard protocol following bolus administration of intravenous contrast.  CONTRAST: 100 mL Isovue 370  COMPARISON:  08/27/2013  FINDINGS: Lower Chest: Stable tiny scattered pulmonary nodules noted in both lung bases, likely postinflammatory etiology.  Hepatobiliary: Stable postop changes from left hepatic lobe resection. Tiny sub-cm cysts again seen in the peripheral liver dome. No liver masses identified. Gallbladder is unremarkable.  Pancreas: No mass, inflammatory changes, or other parenchymal abnormality identified.  Spleen: Within normal limits in size and appearance.  Adrenal Glands: No mass identified.  Kidneys/Urinary Tract: No masses identified. No evidence of hydronephrosis.  Stomach/Bowel/Peritoneum: Postsurgical changes from esophagogastrectomy are stable. No evidence of wall thickening or obstruction involving small bowel or colon.  Vascular/Lymphatic: No pathologically enlarged lymph nodes identified. No other significant abnormality identified.  Reproductive: No mass or other significant abnormality identified.  Other: None.  Musculoskeletal: No suspicious bone lesions identified.  IMPRESSION: Stable postop changes from esophagogastrectomy and partial hepatectomy. No evidence of recurrent or metastatic disease within the abdomen or pelvis.   Electronically Signed By: Earle Gell M.D. On: 03/03/2014 13:43    CT CHEST, ABDOMEN, AND PELVIS WITH CONTRAST  TECHNIQUE: Multidetector CT imaging of the chest, abdomen and pelvis was performed following the standard protocol during bolus administration of intravenous contrast.  CONTRAST: 100 cc Isovue 370.  COMPARISON: 04/21/2013  FINDINGS: CT CHEST FINDINGS  The tip of the right-sided Port-A-Cath is positioned in the mid SVC. There is no axillary lymphadenopathy. No mediastinal or hilar lymphadenopathy. Fluid in the midesophagus may be related to reflux or dysmotility. Anastomotic suture line identified in the distal esophagus with another suture line noted in the proximal stomach.  3.4 x 2.4 cm right thyroid  lesion measures slightly smaller on today's study which may be secondary to bolus timing.  Heart size is stable. No pericardial or pleural effusion.  Lung windows reveal underlying emphysema. Scattered nodularity is seen in the right upper lobe. The clustered small nodules in the anteromedial right upper lobe are stable. Areas of clustered nodules in the right middle lobe (image 33 of series 4) are stable.  The 7 mm lingular nodule seen on the previous study is stable.  New clustered areas of nodularity are seen in the right lower lobe on today's study (see images 46 scan 40). Tiny nodules seen in the posterior left lower lobe on the previous study have almost completely resolved in the interval (see image 49).  Bone windows reveal no worrisome lytic or sclerotic osseous lesions.  CT ABDOMEN AND PELVIS FINDINGS  Patient is status post left hepatectomy. No focal abnormality is seen in the remaining portion of the liver. Spleen is unremarkable. 14 mm low-density nodule associated with a suture line in the mid stomach is unchanged in the interval. Duodenum, pancreas, gallbladder, and adrenal glands are unremarkable. Cortical scarring with decreased perfusion in the upper pole the left kidney is stable. Tiny interpolar left renal cysts  noted. No enhancing renal mass seen in either kidney.  No lymphadenopathy or free fluid in the abdomen.  Imaging through the pelvis shows no free intraperitoneal fluid. There is no pelvic sidewall lymphadenopathy. Bladder is unremarkable. Prostate gland is enlarged. No colonic diverticulitis terminal ileum is normal. The appendix is not visualized, but there is no edema or inflammation in the region of the cecum. Fat show laxity in the right lower quadrant is evident without an overt inguinal hernia.  Bone windows reveal no worrisome lytic or sclerotic osseous lesions.  IMPRESSION: Scattered areas of nodularity in both lungs, some of which  are stable, some of which are improved, and some of which are new. A new areas of clustered nodularity are very similar to the existing areas. This may all represent sequelae of infectious/inflammatory process but continued close attention is recommended as metastatic disease in the new areas cannot be completely excluded.  Postsurgical changes in the stomach and liver without evidence for metastatic disease in the abdomen or pelvis.   Electronically Signed By: Misty Stanley M.D. On: 08/27/2013 12:06    CLINICAL DATA: Esophageal cancer. Restaging exam. History of surgery and radio chemotherapy.  EXAM:04/21/2013 CT CHEST, ABDOMEN, AND PELVIS WITH CONTRAST  TECHNIQUE: Multidetector CT imaging of the chest, abdomen and pelvis was performed following the standard protocol during bolus administration of intravenous contrast.  CONTRAST: 100 mL Isovue  COMPARISON: CT 02/04/2013  FINDINGS: CT CHEST FINDINGS  There is a port in the right anterior chest wall. No axillary supraclavicular lymphadenopathy. Nodule enlargement of the right lobe of thyroid gland. Left lobe is surgically absent. Thyroid enlargement is unchanged from prior PET-CT. Review of the lung parenchyma demonstrates a 7 mm pulmonary nodule in the inferior lingula (image 39). This region was not imaged on the more recent CT exam of the abdomen pelvis. This region was included on the PET-CT scan and was not clearly evident on that exam. At the left lung base are is a cluster branching peripheral nodules largest measuring 10 mm (image 46) which the largest is not increased in size but a smaller nodules are new. Small peripheral branching nodule in the lateral right upper lobe is also not imaged (image 34).  CT ABDOMEN AND PELVIS FINDINGS  The distal esophagus is normal. There is surgical resection margin of the esophagus. No adenopathy in the gastrohepatic ligament. Stomach, small bowel, and colon are  unremarkable.  A tiny hypodensity in the right hepatic lobe (image 50) which is unchanged from prior. There is partial left hepatectomy. The spleen, adrenal glands, kidneys are normal.  No free fluid the pelvis. The prostate gland and bladder normal. No retroperitoneal lymphadenopathy. Insert negative then  IMPRESSION: 1. A 7 mm nodule in the lingula appears new from prior PET-CT scan. Recommend short interval CT follow-up the thorax with contrast (1 to 3 months). 2. Increased nodularity of irregular nodule at the left lung base is also indeterminate. Recommend close attention on follow-up. 3. Postsurgical change in distal esophagus and liver without evidence of local recurrence.   Electronically Signed By: Suzy Bouchard M.D. On: 04/21/2013 14:03   RADIOLOGY REPORT*  Clinical Data: History of esophageal cancer.  02/04/2013 CT ABDOMEN AND PELVIS WITH CONTRAST  Technique: Multidetector CT imaging of the abdomen and pelvis was performed following the standard protocol during bolus administration of intravenous contrast.  Contrast: 100 ml Isovue 370  Comparison: CT abdomen pelvis 10/02/2012  Findings: Interval development of a 1.0 cm nodule within the left lower lobe (image 6; series  4). No pleural effusion or pneumothorax.  Redemonstrated subcentimeter low attenuation lesions in the hepatic dome, unchanged from prior examination. No new hepatic lesions are identified. Persistent cavernous transformation of the portal vein. Redemonstrated pancreatic atrophy. The gallbladder and bilateral adrenal glands are unremarkable. Kidneys enhance symmetrically with contrast.  Normal caliber abdominal aorta with calcified atherosclerotic plaque. No retroperitoneal adenopathy. Urinary bladder is grossly unremarkable. TURP defect redemonstrated within the prostate.  Oral contrast material is demonstrated throughout the small and large bowel. No abnormal bowel wall thickening. No  free fluid or free intraperitoneal air. Redemonstrated distension of the distal aspect of the appendix measuring up to 1.4 cm in diameter, concerning fluid attenuation material. No periappendiceal fat stranding is identified.  Redemonstrated postsurgical change at the GE junction with some wall thickening associated with the central line. This appearance is grossly similar to recent prior examination.  Lower lumbar spine degenerative change. No aggressive appearing osseous lesions.  IMPRESSION: 1. Stable appearance of the postsurgical GE junction with associated wall thickening at the level of the suture line. This is likely postoperative in etiology however attention on follow-up is recommended. 2. Interval development of a 1.0 cm nodular opacity within the left lower lobe. While this may potentially be infectious/inflammatory etiology, metastatic disease is not excluded. 3. Unchanged sub-centimeter low attenuation liver lesions. 4. Unchanged distended appendiceal tip. Considerations include mucocele of the appendix or potentially mucinous appendiceal neoplasm.   Original Report Authenticated By: Lovey Newcomer, M.D    Recent Labs: Lab Results  Component Value Date   WBC 5.9 03/19/2012   HGB 10.8* 03/19/2012   HCT 32.4* 03/19/2012   PLT 222 03/19/2012   GLUCOSE 109* 03/05/2012   CHOL 107 03/04/2012   TRIG 85 03/04/2012   ALT 50 03/04/2012   AST 21 03/04/2012   NA 139 03/05/2012   K 4.1 03/05/2012   CL 104 03/05/2012   CREATININE 0.55 03/05/2012   BUN 12 03/05/2012   CO2 28 03/05/2012   INR 1.14 02/22/2012      Assessment / Plan:   Resected GE  junction adeno carcinoma Stage IIB (pT3,pN1,cM0) now completed postop radiation and chemo therapy Doing well clinically CT scan of the abdomen/pelvis  - no ct of chest done oct 2015 , neck and abdomen   Oncology in [place]/Dr. Bobby Rumpf has been arranging followup scans.  IPlan see the patient back in 8 months  Patient  will discuss with primary care the incidental finding of bilateral carotid stenosis on the CT scan of the neck, possibly a carotid Doppler would be a more appropriate test. Patient is asymptomatic   Vienne Corcoran B 03/05/2014 12:30 PM

## 2014-05-26 DIAGNOSIS — H52222 Regular astigmatism, left eye: Secondary | ICD-10-CM | POA: Diagnosis not present

## 2014-05-26 DIAGNOSIS — H44521 Atrophy of globe, right eye: Secondary | ICD-10-CM | POA: Diagnosis not present

## 2014-05-26 DIAGNOSIS — H524 Presbyopia: Secondary | ICD-10-CM | POA: Diagnosis not present

## 2014-05-26 DIAGNOSIS — H43392 Other vitreous opacities, left eye: Secondary | ICD-10-CM | POA: Diagnosis not present

## 2014-05-26 DIAGNOSIS — H2589 Other age-related cataract: Secondary | ICD-10-CM | POA: Diagnosis not present

## 2014-05-26 DIAGNOSIS — H5202 Hypermetropia, left eye: Secondary | ICD-10-CM | POA: Diagnosis not present

## 2014-05-26 DIAGNOSIS — H2512 Age-related nuclear cataract, left eye: Secondary | ICD-10-CM | POA: Diagnosis not present

## 2014-06-14 IMAGING — PT NM PET TUM IMG INITIAL (PI) SKULL BASE T - THIGH
6 series · 25 of 25 positions shown · non-contrast
Comparison: Multiple exams, including 02/15/2012 and 02/14/2012

CLINICAL DATA: Initial treatment strategy for esophageal cancer.

NUCLEAR MEDICINE PET SKULL BASE TO THIGH
Fasting Blood Glucose:  102
TECHNIQUE: 14.6 mCi F-18 FDG was injected intravenously. CT data
was obtained and used for attenuation correction and anatomic
localization only.  (This was not acquired as a diagnostic CT
examination.) Additional exam technical data entered on
technologist worksheet.

[Series 1: pet ac · axial · 3.3mm · 4.69mm/px · z∈[-855,+15]mm · 5 of 267 slices shown]
[im 1/267]
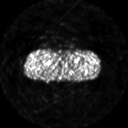
[im 67/267]
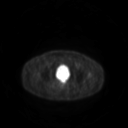
[im 134/267]
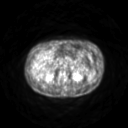
[im 200/267]
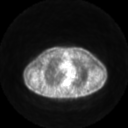
[im 267/267]
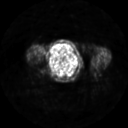

[Series 2: pet nac · axial · 3.3mm · 4.69mm/px · z∈[-855,+15]mm · 6 of 267 slices shown]
[im 1/267]
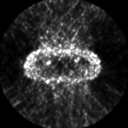
[im 54/267]
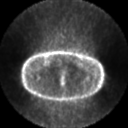
[im 107/267]
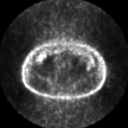
[im 160/267]
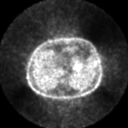
[im 213/267]
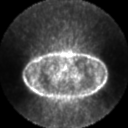
[im 267/267]
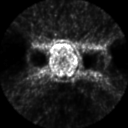

[Series 2: ct images · axial · 3.8mm · 0.98mm/px · z∈[-855,+15]mm · 5 of 267 slices shown]
[im 1/267]
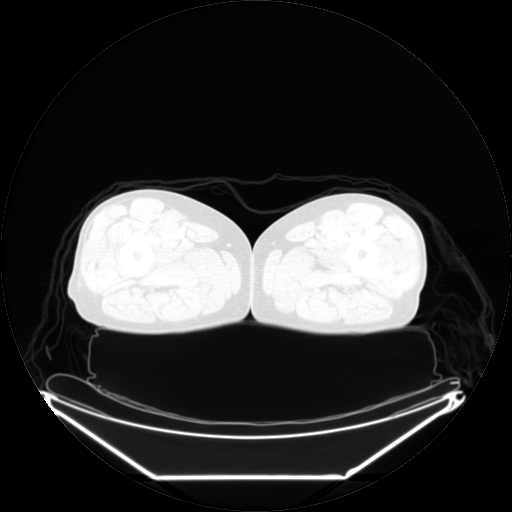
[im 67/267]
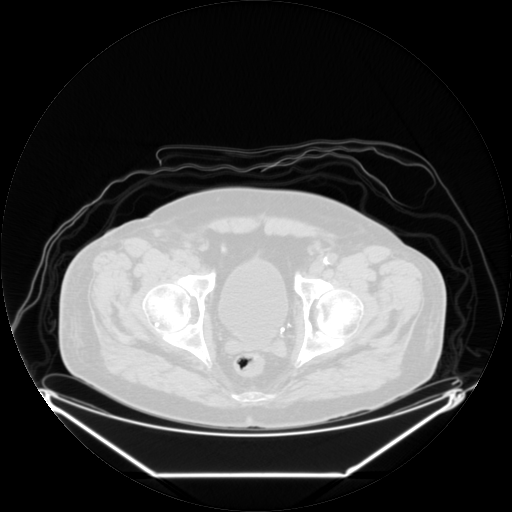
[im 134/267]
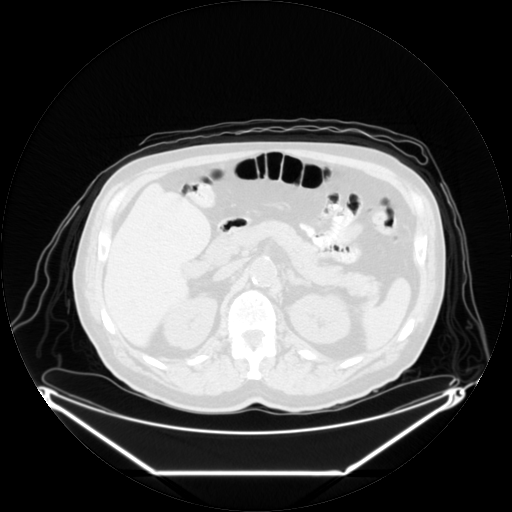
[im 200/267]
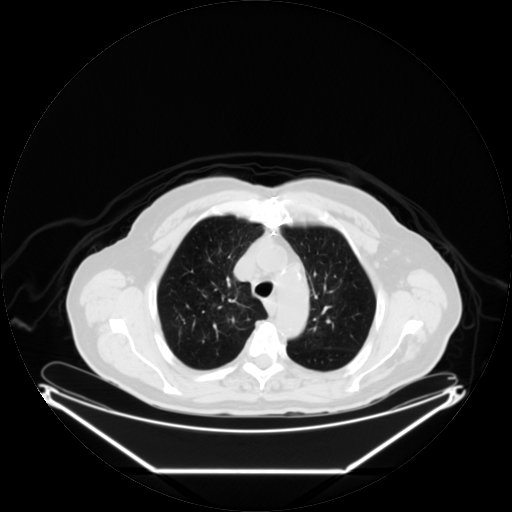
[im 267/267  brain]
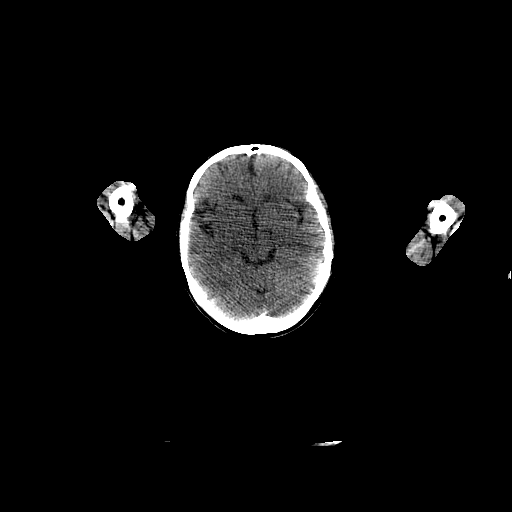

[Series 123: mip · coronal · 3.3mm · 4.69mm/px · 1 of 30 slices shown]
[im 1/30]
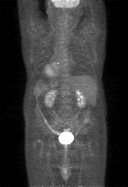

[Series 151: reformatted · axial · 3.3mm · 3.91mm/px · z∈[-855,+15]mm · 6 of 267 slices shown (1 of 2)]
[im 1/267]
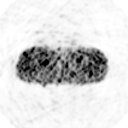
[im 54/267]
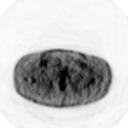
[im 107/267]
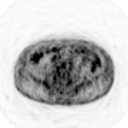
[im 160/267]
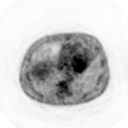
[im 213/267]
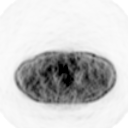
[im 267/267]
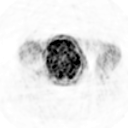

[Series 153: reformatted · coronal · 4.7mm · 6.98mm/px · 2 of 73 slices shown (2 of 2)]
[im 1/73]
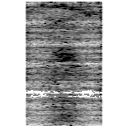
[im 73/73]
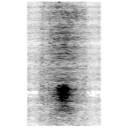

[25 of 25 positions shown; findings below may reference images not displayed]

FINDINGS: Neck: Diffuse activity in the enlarged and somewhat heterogeneous
right thyroid lobe is similar to background mediastinal activity.
There are several foci of increased activity along the right
tonsillar pillar and right tongue base, probably physiologic and
incidental but somewhat nonspecific.  Mild asymmetry of the vocal
cords noted without hypermetabolic the laryngeal activity.  Right
phthisis bulbi noted.

Chest:  Along the tree in bud nodularity in the lingula, there is a
1.0 x 0.8 cm hypermetabolic nodule with maximum standard uptake
value of 10.0.  Similarly, at approximately the same vertical
level, there is a 0.6 x 0.8 cm nodule associated with adjacent tree
in bud nodules anteriorly in the right upper lobe, with a maximum
standard uptake value of 6.0 on PET images.  Faint regional
activity are noted along the adjacent tree in bud nodularity

The prior report, there is malignancy in the vicinity of the distal
esophagus.  There is only faint associated metabolic activity at
the gastroesophageal junction.  No para esophageal nodal
hypermetabolic activity observed.

Abdomen/Pelvis:  Clips noted in the gastrohepatic ligament.  High
activity in the sigmoid colon is probably physiologic; there is
sigmoid diverticulosis.

Skeleton:  No focal hypermetabolic activity to suggest skeletal
metastasis.
IMPRESSION: 1.  Nodular regions of hypermetabolic activity anteriorly in the
right upper lobe and medially in the lingula.  These are in areas
associated with tree in bud peripheral opacities. Given the
location and appearance as well as the esophageal primary, I favor
these as being granulomatous infectious lesions associated with
atypical infectious bronchiolitis.  Metastatic disease to the lungs
is considered a less likely differential diagnostic consideration.
These regions could be assessed for stability or resolution on
follow-up CT.
2.  In the vicinity of the distal esophagus, we demonstrate only
low-level activity.
3.  Sigmoid diverticulosis.
4.  Enlarged and somewhat heterogeneous right thyroid lobe with
activity similar to background.  This may warrant observation
ultrasound.
5.  Several small focus of foci of increased activity along the
right tonsillar pillar and right tongue base, probably physiologic
but overall nonspecific.

## 2014-06-17 IMAGING — CR DG CHEST 1V PORT
1 series · 1 of 1 positions shown · non-contrast
Comparison: 02/23/2012 at 7739 hours.

CLINICAL DATA: Postoperative and esophagogastrectomy.

PORTABLE CHEST - 1 VIEW

[AP]
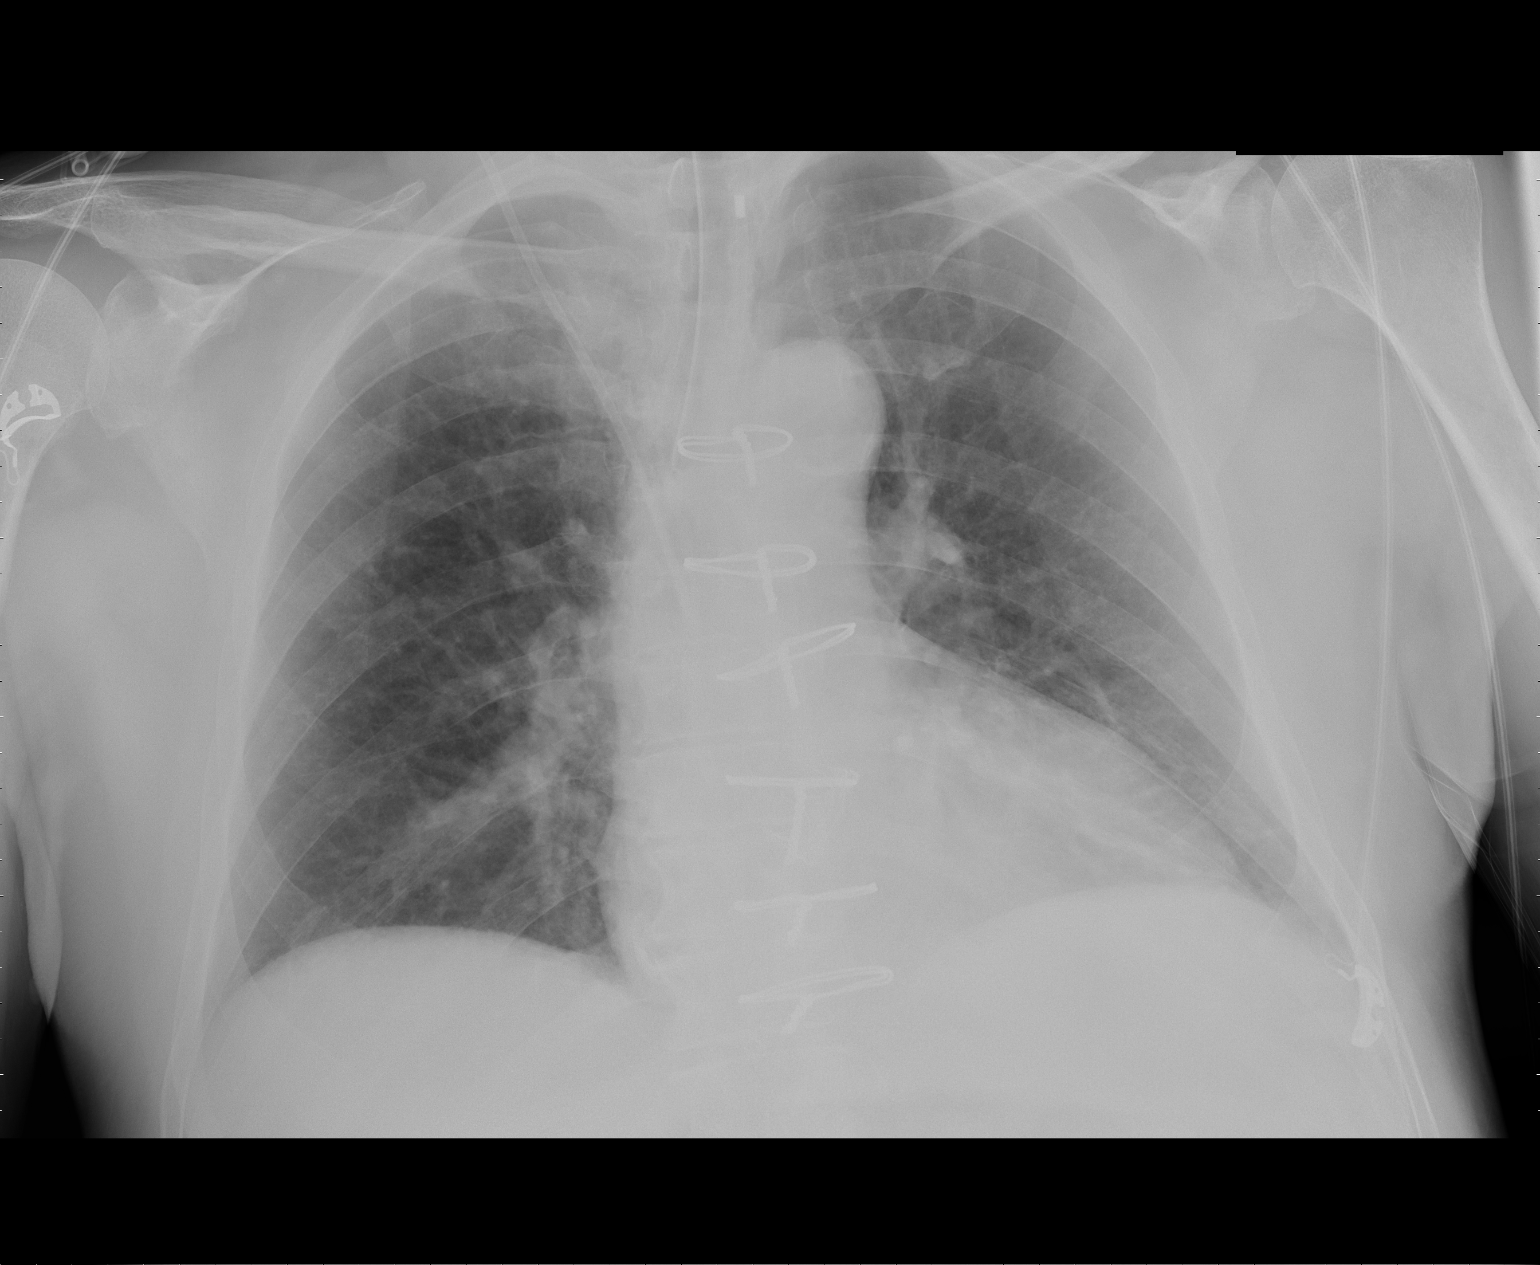

[1 of 1 positions shown; findings below may reference images not displayed]

FINDINGS: An endotracheal tube is now in place.  The tip of this
tube is 2 cm above the carina. Right central intravenous line has
been inserted through the right jugular vein.  The tip of the
catheter is at the level of the carina.
The lungs are clear.  No effusions or pneumothoraces.  Previous
median sternotomy.  The heart and mediastinal structures appear
normal.  There are no acute bony changes.
IMPRESSION: No acute findings.  Endotracheal tube and right IJ catheter in
position as described.

## 2014-07-28 DIAGNOSIS — Z9181 History of falling: Secondary | ICD-10-CM | POA: Diagnosis not present

## 2014-07-28 DIAGNOSIS — Z1389 Encounter for screening for other disorder: Secondary | ICD-10-CM | POA: Diagnosis not present

## 2014-07-28 DIAGNOSIS — E785 Hyperlipidemia, unspecified: Secondary | ICD-10-CM | POA: Diagnosis not present

## 2014-07-28 DIAGNOSIS — I10 Essential (primary) hypertension: Secondary | ICD-10-CM | POA: Diagnosis not present

## 2014-07-28 DIAGNOSIS — J449 Chronic obstructive pulmonary disease, unspecified: Secondary | ICD-10-CM | POA: Diagnosis not present

## 2014-07-28 DIAGNOSIS — Z6823 Body mass index (BMI) 23.0-23.9, adult: Secondary | ICD-10-CM | POA: Diagnosis not present

## 2014-07-28 DIAGNOSIS — C160 Malignant neoplasm of cardia: Secondary | ICD-10-CM | POA: Diagnosis not present

## 2014-09-07 DIAGNOSIS — I517 Cardiomegaly: Secondary | ICD-10-CM | POA: Diagnosis not present

## 2014-09-07 DIAGNOSIS — R918 Other nonspecific abnormal finding of lung field: Secondary | ICD-10-CM | POA: Diagnosis not present

## 2014-09-07 DIAGNOSIS — C159 Malignant neoplasm of esophagus, unspecified: Secondary | ICD-10-CM | POA: Diagnosis not present

## 2014-09-07 DIAGNOSIS — C158 Malignant neoplasm of overlapping sites of esophagus: Secondary | ICD-10-CM | POA: Diagnosis not present

## 2014-09-08 DIAGNOSIS — Z8501 Personal history of malignant neoplasm of esophagus: Secondary | ICD-10-CM | POA: Diagnosis not present

## 2014-09-24 DIAGNOSIS — C159 Malignant neoplasm of esophagus, unspecified: Secondary | ICD-10-CM | POA: Diagnosis not present

## 2014-10-02 ENCOUNTER — Other Ambulatory Visit: Payer: Self-pay

## 2014-10-02 DIAGNOSIS — Z79899 Other long term (current) drug therapy: Secondary | ICD-10-CM | POA: Diagnosis not present

## 2014-10-02 DIAGNOSIS — Z87891 Personal history of nicotine dependence: Secondary | ICD-10-CM | POA: Diagnosis not present

## 2014-10-02 DIAGNOSIS — Z7982 Long term (current) use of aspirin: Secondary | ICD-10-CM | POA: Diagnosis not present

## 2014-10-02 DIAGNOSIS — C159 Malignant neoplasm of esophagus, unspecified: Secondary | ICD-10-CM | POA: Diagnosis not present

## 2014-10-02 DIAGNOSIS — Z8501 Personal history of malignant neoplasm of esophagus: Secondary | ICD-10-CM | POA: Diagnosis not present

## 2014-10-02 DIAGNOSIS — Z452 Encounter for adjustment and management of vascular access device: Secondary | ICD-10-CM | POA: Diagnosis not present

## 2014-10-02 DIAGNOSIS — Z954 Presence of other heart-valve replacement: Secondary | ICD-10-CM | POA: Diagnosis not present

## 2014-10-02 DIAGNOSIS — J449 Chronic obstructive pulmonary disease, unspecified: Secondary | ICD-10-CM | POA: Diagnosis not present

## 2014-10-02 DIAGNOSIS — E780 Pure hypercholesterolemia: Secondary | ICD-10-CM | POA: Diagnosis not present

## 2014-10-02 DIAGNOSIS — I10 Essential (primary) hypertension: Secondary | ICD-10-CM | POA: Diagnosis not present

## 2014-10-17 DIAGNOSIS — M19032 Primary osteoarthritis, left wrist: Secondary | ICD-10-CM | POA: Diagnosis not present

## 2014-10-17 DIAGNOSIS — L02512 Cutaneous abscess of left hand: Secondary | ICD-10-CM | POA: Diagnosis not present

## 2014-10-20 DIAGNOSIS — Z9181 History of falling: Secondary | ICD-10-CM | POA: Diagnosis not present

## 2014-10-20 DIAGNOSIS — Z1389 Encounter for screening for other disorder: Secondary | ICD-10-CM | POA: Diagnosis not present

## 2014-10-20 DIAGNOSIS — L03114 Cellulitis of left upper limb: Secondary | ICD-10-CM | POA: Diagnosis not present

## 2014-10-20 DIAGNOSIS — Z6821 Body mass index (BMI) 21.0-21.9, adult: Secondary | ICD-10-CM | POA: Diagnosis not present

## 2014-11-05 ENCOUNTER — Encounter: Payer: Self-pay | Admitting: Cardiothoracic Surgery

## 2014-11-05 ENCOUNTER — Ambulatory Visit (INDEPENDENT_AMBULATORY_CARE_PROVIDER_SITE_OTHER): Payer: Medicare HMO | Admitting: Cardiothoracic Surgery

## 2014-11-05 VITALS — BP 144/73 | HR 58 | Resp 20 | Ht 66.0 in | Wt 136.0 lb

## 2014-11-05 DIAGNOSIS — C169 Malignant neoplasm of stomach, unspecified: Secondary | ICD-10-CM | POA: Diagnosis not present

## 2014-11-05 NOTE — Progress Notes (Signed)
[address]       [place] [address]             [phone]         [name] Record #[id number] Date of Birth: Mar 04, 1933  Dr Homero Fellers NP  Chief Complaint:   PostOp Follow Up Visit 02/23/2012  OPERATIVE REPORT  PREOPERATIVE DIAGNOSIS: Adenocarcinoma of the gastroesophageal  junction.  POSTOPERATIVE DIAGNOSIS: Adenocarcinoma of the gastroesophageal  junction.  SURGICAL PROCEDURE: Laparoscopic staging and mobilization of the  foregut endoscopically, open distal esophageal proximal gastric  resection with esophagogastroenterostomy, and placement of a feeding  jejunostomy tube.   Gastric cancer   Primary site: Stomach   Staging method: AJCC 7th Edition   Pathologic: Stage IIB (T3, N1, cM0) signed by Grace Isaac, MD on 02/28/2012  8:47 AM   Summary: Stage IIB (T3, N1, cM0)  History of Present Illness:     Patient returns for followup visit after resection of adenocarcinoma of the GE junction October 2013 adeno carcinoma Stage IIB (pT3,pN1,cM0) . He  completed course of radiation therapy and chemotherapy post operatively after presenting with massive GI bleed. Patient continues to do well from a clinical standpoint. He has not been able to gain weight, but remains active and taking a diet well. He has no swallowing difficulties.  He notes that a recent endoscopy was performed biopsies were done around the anastomotic site, the patient reports that the biopsies were negative for pregnancy. A recent CT of the neck and abdomen was done at [facility].   History  Smoking status  . Former Smoker -- 5.00 packs/day  . Types: Cigarettes  . Quit date: 01/11/1992  Smokeless tobacco  . Not on file       No Known Allergies  Current Outpatient Prescriptions  Medication Sig Dispense Refill  . amLODipine-benazepril (LOTREL) 10-40 MG per capsule Take 1 capsule by mouth daily.    Marland Kitchen aspirin 81 MG tablet Take 81  mg by mouth daily.    . carvedilol (COREG) 25 MG tablet Take 12.5 mg by mouth daily.     . hydrochlorothiazide 25 MG tablet Take 25 mg by mouth daily.      . Multiple Vitamin (MULTIVITAMIN) capsule Take 1 capsule by mouth daily.    . pantoprazole (PROTONIX) 40 MG tablet Take 1 tablet (40 mg total) by mouth 2 (two) times daily. 60 tablet 1  . pravastatin (PRAVACHOL) 10 MG tablet Take 10 mg by mouth at bedtime.    . prednisoLONE acetate (PRED FORTE) 1 % ophthalmic suspension Place 2-3 drops into the right eye at bedtime.    . vitamin B-12 (CYANOCOBALAMIN) 1000 MCG tablet Take 1,000 mcg by mouth daily.     No current facility-administered medications for this visit.   Wt Readings from Last 3 Encounters:  11/05/14 136 lb (61.689 kg)  03/05/14 138 lb (62.596 kg)  08/28/13 136 lb (61.689 kg)  01/03/2013        132 lb    Physical Exam: BP 144/73 mmHg  Pulse 58  Resp 20  Ht 5\' 6"  (1.676 m)  Wt 136 lb (61.689 kg)  BMI 21.96 kg/m2  SpO2 96%    General appearance: alert and cooperative Neurologic: intact Heart: regular rate and rhythm, S1, S2 normal, no murmur, click, rub or gallop and normal apical impulse Lungs: clear to auscultation bilaterally and normal percussion bilaterally Abdomen: soft, non-tender; bowel sounds normal; no masses,  no organomegaly Extremities: extremities normal, atraumatic, no cyanosis or edema, Homans sign is negative, no sign of DVT and no edema, redness or tenderness in the calves or thighs Wound: wounds healing well I do not appreciate any cervical supraclavicular or axillary adenopathy, The patient has no carotid bruits  Diagnostic Studies & Laboratory data:         Recent Radiology Findings: CLINICAL DATA: Esophageal cancer.  EXAM: CT ABDOMEN AND PELVIS WITH CONTRAST  TECHNIQUE: Multidetector CT imaging of the abdomen and pelvis was performed using the standard protocol following bolus administration of intravenous contrast.  CONTRAST: 80 cc  Isovue 370  COMPARISON: 08/27/2013  FINDINGS: Lower chest: The lung bases demonstrate stable patchy tree-in-bud appearance suggesting chronic inflammation or atypical infection such as MAC. No focal worrisome pulmonary lesions or infiltrate. No pleural effusion. The heart is normal in size. Stable advanced atherosclerotic calcifications involving the coronary arteries. Stable surgical changes involving the distal esophagus and stomach.  Hepatobiliary: No worrisome hepatic lesions to suggest metastatic disease. Stable low-attenuation lesion and segment 8. The patient has had a partial hepatectomy. No intrahepatic biliary dilatation. No common bile duct dilatation. The portal vein is patent. Persistent periportal collaterals.  Pancreas: Stable pancreatic atrophy and mild ductal prominence but no focal mass or acute inflammation.  Spleen: Normal size. Calcified granulomas but no worrisome lesions.  Adrenals/Urinary Tract: The adrenal glands and kidneys are stable. Stable small renal calculi.  Stomach/Bowel: The stomach demonstrates postoperative changes. No findings suspicious for recurrent tumor. The duodenum, small bowel and colon are unremarkable. No inflammatory changes, mass lesions or obstructive findings.  Vascular/Lymphatic: Stable advanced atherosclerotic calcifications involving the aorta and branch vessels. No focal aneurysm or dissection. Stable scattered mesenteric and retroperitoneal lymph nodes but no overt mass or adenopathy.  Other: Stable surgical changes from a TURP. The bladder appears normal. No pelvic mass or adenopathy. No inguinal mass or adenopathy.  Musculoskeletal: No significant bony findings. No evidence osseous metastatic disease.  IMPRESSION: 1. Patchy tree-in-bud findings in the lower lung zones bilaterally suggesting chronic inflammation or atypical infection. 2. Stable surgical changes involving the distal esophagus and stomach. No  findings suspicious for recurrent tumor. 3. Small scattered mesenteric and retroperitoneal lymph nodes but no adenopathy or findings for metastatic disease. 4. Stable advanced atherosclerotic changes involving the aorta and branch vessels.   Electronically Signed By: Marijo Sanes M.D. On: 09/07/2014 11:08    Recent Labs: Lab Results  Component Value Date   WBC 5.9 03/19/2012   HGB 10.8* 03/19/2012   HCT 32.4* 03/19/2012   PLT 222 03/19/2012   GLUCOSE 109* 03/05/2012   CHOL 107 03/04/2012   TRIG 85 03/04/2012   ALT 50 03/04/2012   AST 21 03/04/2012   NA 139 03/05/2012   K 4.1 03/05/2012   CL 104 03/05/2012   CREATININE 0.55 03/05/2012   BUN 12 03/05/2012   CO2 28 03/05/2012   INR 1.14 02/22/2012      Assessment / Plan:   Resected GE  junction adeno carcinoma Stage IIB (pT3,pN1,cM0) now completed postop radiation and chemo therapy Now 2.5 years following resection without evidence of recurrence He will discuss with primary care management of BP meds. He notes some episodes of "low" BP Plan to see him back in one year, continues to be followed at [facility] 11/05/2014 11:49 AM

## 2014-11-23 DIAGNOSIS — I10 Essential (primary) hypertension: Secondary | ICD-10-CM | POA: Diagnosis not present

## 2014-11-23 DIAGNOSIS — Z6822 Body mass index (BMI) 22.0-22.9, adult: Secondary | ICD-10-CM | POA: Diagnosis not present

## 2014-11-23 DIAGNOSIS — R42 Dizziness and giddiness: Secondary | ICD-10-CM | POA: Diagnosis not present

## 2014-12-17 DIAGNOSIS — H2512 Age-related nuclear cataract, left eye: Secondary | ICD-10-CM | POA: Diagnosis not present

## 2014-12-24 DIAGNOSIS — Z1389 Encounter for screening for other disorder: Secondary | ICD-10-CM | POA: Diagnosis not present

## 2014-12-24 DIAGNOSIS — I10 Essential (primary) hypertension: Secondary | ICD-10-CM | POA: Diagnosis not present

## 2014-12-24 DIAGNOSIS — Z6822 Body mass index (BMI) 22.0-22.9, adult: Secondary | ICD-10-CM | POA: Diagnosis not present

## 2014-12-24 DIAGNOSIS — Z139 Encounter for screening, unspecified: Secondary | ICD-10-CM | POA: Diagnosis not present

## 2015-02-04 DIAGNOSIS — Z0000 Encounter for general adult medical examination without abnormal findings: Secondary | ICD-10-CM | POA: Diagnosis not present

## 2015-02-04 DIAGNOSIS — Z125 Encounter for screening for malignant neoplasm of prostate: Secondary | ICD-10-CM | POA: Diagnosis not present

## 2015-02-04 DIAGNOSIS — I10 Essential (primary) hypertension: Secondary | ICD-10-CM | POA: Diagnosis not present

## 2015-02-04 DIAGNOSIS — Z79899 Other long term (current) drug therapy: Secondary | ICD-10-CM | POA: Diagnosis not present

## 2015-02-04 DIAGNOSIS — R5381 Other malaise: Secondary | ICD-10-CM | POA: Diagnosis not present

## 2015-02-04 DIAGNOSIS — E785 Hyperlipidemia, unspecified: Secondary | ICD-10-CM | POA: Diagnosis not present

## 2015-02-04 DIAGNOSIS — Z23 Encounter for immunization: Secondary | ICD-10-CM | POA: Diagnosis not present

## 2015-02-04 DIAGNOSIS — Z6822 Body mass index (BMI) 22.0-22.9, adult: Secondary | ICD-10-CM | POA: Diagnosis not present

## 2015-03-11 DIAGNOSIS — C158 Malignant neoplasm of overlapping sites of esophagus: Secondary | ICD-10-CM | POA: Diagnosis not present

## 2015-06-08 DIAGNOSIS — E785 Hyperlipidemia, unspecified: Secondary | ICD-10-CM | POA: Diagnosis not present

## 2015-06-08 DIAGNOSIS — I10 Essential (primary) hypertension: Secondary | ICD-10-CM | POA: Diagnosis not present

## 2015-06-08 DIAGNOSIS — D539 Nutritional anemia, unspecified: Secondary | ICD-10-CM | POA: Diagnosis not present

## 2015-06-24 DIAGNOSIS — Z0100 Encounter for examination of eyes and vision without abnormal findings: Secondary | ICD-10-CM | POA: Diagnosis not present

## 2015-06-24 DIAGNOSIS — H43392 Other vitreous opacities, left eye: Secondary | ICD-10-CM | POA: Diagnosis not present

## 2015-06-24 DIAGNOSIS — H2512 Age-related nuclear cataract, left eye: Secondary | ICD-10-CM | POA: Diagnosis not present

## 2015-06-24 DIAGNOSIS — H524 Presbyopia: Secondary | ICD-10-CM | POA: Diagnosis not present

## 2015-06-24 DIAGNOSIS — H44521 Atrophy of globe, right eye: Secondary | ICD-10-CM | POA: Diagnosis not present

## 2015-06-24 DIAGNOSIS — H5202 Hypermetropia, left eye: Secondary | ICD-10-CM | POA: Diagnosis not present

## 2015-08-25 DIAGNOSIS — Z6822 Body mass index (BMI) 22.0-22.9, adult: Secondary | ICD-10-CM | POA: Diagnosis not present

## 2015-08-25 DIAGNOSIS — R600 Localized edema: Secondary | ICD-10-CM | POA: Diagnosis not present

## 2015-09-08 DIAGNOSIS — Z8501 Personal history of malignant neoplasm of esophagus: Secondary | ICD-10-CM | POA: Diagnosis not present

## 2015-09-08 DIAGNOSIS — Z8509 Personal history of malignant neoplasm of other digestive organs: Secondary | ICD-10-CM | POA: Diagnosis not present

## 2015-10-05 DIAGNOSIS — D539 Nutritional anemia, unspecified: Secondary | ICD-10-CM | POA: Diagnosis not present

## 2015-10-05 DIAGNOSIS — I10 Essential (primary) hypertension: Secondary | ICD-10-CM | POA: Diagnosis not present

## 2015-10-05 DIAGNOSIS — Z6822 Body mass index (BMI) 22.0-22.9, adult: Secondary | ICD-10-CM | POA: Diagnosis not present

## 2015-10-05 DIAGNOSIS — D489 Neoplasm of uncertain behavior, unspecified: Secondary | ICD-10-CM | POA: Diagnosis not present

## 2015-10-05 DIAGNOSIS — J449 Chronic obstructive pulmonary disease, unspecified: Secondary | ICD-10-CM | POA: Diagnosis not present

## 2015-10-05 DIAGNOSIS — E785 Hyperlipidemia, unspecified: Secondary | ICD-10-CM | POA: Diagnosis not present

## 2015-10-12 DIAGNOSIS — D485 Neoplasm of uncertain behavior of skin: Secondary | ICD-10-CM | POA: Diagnosis not present

## 2015-10-12 DIAGNOSIS — L728 Other follicular cysts of the skin and subcutaneous tissue: Secondary | ICD-10-CM | POA: Diagnosis not present

## 2015-10-12 DIAGNOSIS — L570 Actinic keratosis: Secondary | ICD-10-CM | POA: Diagnosis not present

## 2015-10-12 DIAGNOSIS — L821 Other seborrheic keratosis: Secondary | ICD-10-CM | POA: Diagnosis not present

## 2015-10-12 DIAGNOSIS — R233 Spontaneous ecchymoses: Secondary | ICD-10-CM | POA: Diagnosis not present

## 2015-11-11 ENCOUNTER — Ambulatory Visit: Payer: Medicare HMO | Admitting: Cardiothoracic Surgery

## 2015-11-18 ENCOUNTER — Ambulatory Visit (INDEPENDENT_AMBULATORY_CARE_PROVIDER_SITE_OTHER): Payer: Commercial Managed Care - HMO | Admitting: Cardiothoracic Surgery

## 2015-11-18 ENCOUNTER — Encounter: Payer: Self-pay | Admitting: Cardiothoracic Surgery

## 2015-11-18 VITALS — BP 150/81 | HR 60 | Resp 16 | Ht 66.0 in | Wt 133.4 lb

## 2015-11-18 DIAGNOSIS — Z09 Encounter for follow-up examination after completed treatment for conditions other than malignant neoplasm: Secondary | ICD-10-CM | POA: Diagnosis not present

## 2015-11-18 DIAGNOSIS — C169 Malignant neoplasm of stomach, unspecified: Secondary | ICD-10-CM

## 2015-11-18 DIAGNOSIS — C160 Malignant neoplasm of cardia: Secondary | ICD-10-CM

## 2015-11-18 NOTE — Progress Notes (Signed)
[address]       [place], [address]             [phone]         [name]  Medical Record [id number] Date of Birth: Oct 17, 1932  Dr Homero Fellers NP  Chief Complaint:   PostOp Follow Up Visit 02/23/2012  OPERATIVE REPORT  PREOPERATIVE DIAGNOSIS: Adenocarcinoma of the gastroesophageal  junction.  POSTOPERATIVE DIAGNOSIS: Adenocarcinoma of the gastroesophageal  junction.  SURGICAL PROCEDURE: Laparoscopic staging and mobilization of the  foregut endoscopically, open distal esophageal proximal gastric  resection with esophagogastroenterostomy, and placement of a feeding  jejunostomy tube.   Gastric cancer   Primary site: Stomach   Staging method: AJCC 7th Edition   Pathologic: Stage IIB (T3, N1, cM0) signed by Grace Isaac, MD on 02/28/2012  8:47 AM   Summary: Stage IIB (T3, N1, cM0)  History of Present Illness:     Patient returns for followup visit after resection of adenocarcinoma of the GE junction October 2013 adeno carcinoma Stage IIB (pT3,pN1,cM0)  Now 3.5 years postop  after presenting with massive GI bleed. He  completed course of radiation therapy and chemotherapy post operatively. Patient continues to do well from a clinical standpoint. He remains active and taking a diet well. He has no swallowing difficulties.    History  Smoking status  . Former Smoker -- 5.00 packs/day  . Types: Cigarettes  . Quit date: 01/11/1992  Smokeless tobacco  . Not on file       No Known Allergies  Current Outpatient Prescriptions  Medication Sig Dispense Refill  . amLODipine-benazepril (LOTREL) 10-40 MG per capsule Take 1 capsule by mouth daily.    Marland Kitchen aspirin 81 MG tablet Take 81 mg by mouth daily.    . carvedilol (COREG) 25 MG tablet Take 12.5 mg by mouth daily.     . hydrochlorothiazide 25 MG tablet Take 25 mg by mouth daily.      . Multiple Vitamin (MULTIVITAMIN) capsule Take 1 capsule by mouth daily.    .  pantoprazole (PROTONIX) 40 MG tablet Take 1 tablet (40 mg total) by mouth 2 (two) times daily. 60 tablet 1  . pravastatin (PRAVACHOL) 10 MG tablet Take 10 mg by mouth at bedtime.    . prednisoLONE acetate (PRED FORTE) 1 % ophthalmic suspension Place 2-3 drops into the right eye at bedtime.    . vitamin B-12 (CYANOCOBALAMIN) 1000 MCG tablet Take 1,000 mcg by mouth daily.     No current facility-administered medications for this visit.   Wt Readings from Last 3 Encounters:  11/18/15 133 lb 6.4 oz (60.51 kg)  11/05/14 136 lb (61.689 kg)  03/05/14 138 lb (62.596 kg)  01/03/2013        132 lb    Physical Exam: BP 150/81 mmHg  Pulse 60  Resp 16  Ht 5\' 6"  (1.676 m)  Wt 133 lb 6.4 oz (60.51 kg)  BMI 21.54 kg/m2  SpO2 96%    General appearance: alert and cooperative Neurologic: intact Heart: regular rate and rhythm, S1, S2 normal, no murmur, click, rub or gallop and normal apical impulse Lungs: clear to auscultation bilaterally and normal percussion bilaterally Abdomen: soft, non-tender; bowel sounds normal; no masses,  no organomegaly Extremities: extremities normal, atraumatic, no cyanosis or edema, Homans sign is negative, no sign of DVT and no edema, redness or tenderness in the calves or thighs Wound: wounds healing well I do not appreciate  any cervical supraclavicular or axillary adenopathy, The patient has no carotid bruits  Diagnostic Studies & Laboratory data:         Recent Radiology Findings: CLINICAL DATA: Esophageal cancer.  EXAM: CT ABDOMEN AND PELVIS WITH CONTRAST  TECHNIQUE: Multidetector CT imaging of the abdomen and pelvis was performed using the standard protocol following bolus administration of intravenous contrast.  CONTRAST: 80 cc Isovue 370  COMPARISON: 08/27/2013  FINDINGS: Lower chest: The lung bases demonstrate stable patchy tree-in-bud appearance suggesting chronic inflammation or atypical infection such as MAC. No focal worrisome pulmonary  lesions or infiltrate. No pleural effusion. The heart is normal in size. Stable advanced atherosclerotic calcifications involving the coronary arteries. Stable surgical changes involving the distal esophagus and stomach.  Hepatobiliary: No worrisome hepatic lesions to suggest metastatic disease. Stable low-attenuation lesion and segment 8. The patient has had a partial hepatectomy. No intrahepatic biliary dilatation. No common bile duct dilatation. The portal vein is patent. Persistent periportal collaterals.  Pancreas: Stable pancreatic atrophy and mild ductal prominence but no focal mass or acute inflammation.  Spleen: Normal size. Calcified granulomas but no worrisome lesions.  Adrenals/Urinary Tract: The adrenal glands and kidneys are stable. Stable small renal calculi.  Stomach/Bowel: The stomach demonstrates postoperative changes. No findings suspicious for recurrent tumor. The duodenum, small bowel and colon are unremarkable. No inflammatory changes, mass lesions or obstructive findings.  Vascular/Lymphatic: Stable advanced atherosclerotic calcifications involving the aorta and branch vessels. No focal aneurysm or dissection. Stable scattered mesenteric and retroperitoneal lymph nodes but no overt mass or adenopathy.  Other: Stable surgical changes from a TURP. The bladder appears normal. No pelvic mass or adenopathy. No inguinal mass or adenopathy.  Musculoskeletal: No significant bony findings. No evidence osseous metastatic disease.  IMPRESSION: 1. Patchy tree-in-bud findings in the lower lung zones bilaterally suggesting chronic inflammation or atypical infection. 2. Stable surgical changes involving the distal esophagus and stomach. No findings suspicious for recurrent tumor. 3. Small scattered mesenteric and retroperitoneal lymph nodes but no adenopathy or findings for metastatic disease. 4. Stable advanced atherosclerotic changes involving the aorta  and branch vessels.   Electronically Signed By: Marijo Sanes M.D. On: 09/07/2014 11:08    Recent Labs: Lab Results  Component Value Date   WBC 5.9 03/19/2012   HGB 10.8* 03/19/2012   HCT 32.4* 03/19/2012   PLT 222 03/19/2012   GLUCOSE 109* 03/05/2012   CHOL 107 03/04/2012   TRIG 85 03/04/2012   ALT 50 03/04/2012   AST 21 03/04/2012   NA 139 03/05/2012   K 4.1 03/05/2012   CL 104 03/05/2012   CREATININE 0.55 03/05/2012   BUN 12 03/05/2012   CO2 28 03/05/2012   INR 1.14 02/22/2012      Assessment / Plan:   Resected GE  junction adeno carcinoma Stage IIB (pT3,pN1,cM0) now completed postop radiation and chemo therapy Now 3.5 years following resection without evidence of recurrence Plan to see him back as needed, continues to be followed at [facility] by Dr Nona Dell 11/18/2015 12:34 PM

## 2015-12-16 DIAGNOSIS — H2512 Age-related nuclear cataract, left eye: Secondary | ICD-10-CM | POA: Diagnosis not present

## 2015-12-16 DIAGNOSIS — H44521 Atrophy of globe, right eye: Secondary | ICD-10-CM | POA: Diagnosis not present

## 2015-12-16 DIAGNOSIS — H5202 Hypermetropia, left eye: Secondary | ICD-10-CM | POA: Diagnosis not present

## 2016-01-05 DIAGNOSIS — H269 Unspecified cataract: Secondary | ICD-10-CM | POA: Diagnosis not present

## 2016-01-05 DIAGNOSIS — H2512 Age-related nuclear cataract, left eye: Secondary | ICD-10-CM | POA: Diagnosis not present

## 2016-01-05 DIAGNOSIS — H2589 Other age-related cataract: Secondary | ICD-10-CM | POA: Diagnosis not present

## 2016-01-12 DIAGNOSIS — L821 Other seborrheic keratosis: Secondary | ICD-10-CM | POA: Diagnosis not present

## 2016-01-12 DIAGNOSIS — L219 Seborrheic dermatitis, unspecified: Secondary | ICD-10-CM | POA: Diagnosis not present

## 2016-01-12 DIAGNOSIS — L570 Actinic keratosis: Secondary | ICD-10-CM | POA: Diagnosis not present

## 2016-02-08 DIAGNOSIS — Z9181 History of falling: Secondary | ICD-10-CM | POA: Diagnosis not present

## 2016-02-08 DIAGNOSIS — E785 Hyperlipidemia, unspecified: Secondary | ICD-10-CM | POA: Diagnosis not present

## 2016-02-08 DIAGNOSIS — R351 Nocturia: Secondary | ICD-10-CM | POA: Diagnosis not present

## 2016-02-08 DIAGNOSIS — Z125 Encounter for screening for malignant neoplasm of prostate: Secondary | ICD-10-CM | POA: Diagnosis not present

## 2016-02-08 DIAGNOSIS — I10 Essential (primary) hypertension: Secondary | ICD-10-CM | POA: Diagnosis not present

## 2016-02-08 DIAGNOSIS — Z23 Encounter for immunization: Secondary | ICD-10-CM | POA: Diagnosis not present

## 2016-02-08 DIAGNOSIS — Z0000 Encounter for general adult medical examination without abnormal findings: Secondary | ICD-10-CM | POA: Diagnosis not present

## 2016-02-08 DIAGNOSIS — D539 Nutritional anemia, unspecified: Secondary | ICD-10-CM | POA: Diagnosis not present

## 2016-02-08 DIAGNOSIS — Z1389 Encounter for screening for other disorder: Secondary | ICD-10-CM | POA: Diagnosis not present

## 2016-03-13 DIAGNOSIS — I10 Essential (primary) hypertension: Secondary | ICD-10-CM | POA: Diagnosis not present

## 2016-03-13 DIAGNOSIS — R413 Other amnesia: Secondary | ICD-10-CM | POA: Diagnosis not present

## 2016-03-13 DIAGNOSIS — E538 Deficiency of other specified B group vitamins: Secondary | ICD-10-CM | POA: Diagnosis not present

## 2016-03-13 DIAGNOSIS — H9193 Unspecified hearing loss, bilateral: Secondary | ICD-10-CM | POA: Diagnosis not present

## 2016-03-14 DIAGNOSIS — Z8501 Personal history of malignant neoplasm of esophagus: Secondary | ICD-10-CM | POA: Diagnosis not present

## 2016-03-14 DIAGNOSIS — C158 Malignant neoplasm of overlapping sites of esophagus: Secondary | ICD-10-CM | POA: Diagnosis not present

## 2016-03-21 DIAGNOSIS — Z8673 Personal history of transient ischemic attack (TIA), and cerebral infarction without residual deficits: Secondary | ICD-10-CM | POA: Diagnosis not present

## 2016-03-21 DIAGNOSIS — R413 Other amnesia: Secondary | ICD-10-CM | POA: Diagnosis not present

## 2016-06-28 DIAGNOSIS — I350 Nonrheumatic aortic (valve) stenosis: Secondary | ICD-10-CM | POA: Diagnosis not present

## 2016-07-06 DIAGNOSIS — I350 Nonrheumatic aortic (valve) stenosis: Secondary | ICD-10-CM | POA: Diagnosis not present

## 2016-07-11 DIAGNOSIS — I350 Nonrheumatic aortic (valve) stenosis: Secondary | ICD-10-CM | POA: Diagnosis not present

## 2016-08-09 DIAGNOSIS — D539 Nutritional anemia, unspecified: Secondary | ICD-10-CM | POA: Diagnosis not present

## 2016-08-09 DIAGNOSIS — E785 Hyperlipidemia, unspecified: Secondary | ICD-10-CM | POA: Diagnosis not present

## 2016-08-09 DIAGNOSIS — I350 Nonrheumatic aortic (valve) stenosis: Secondary | ICD-10-CM | POA: Diagnosis not present

## 2016-08-09 DIAGNOSIS — I10 Essential (primary) hypertension: Secondary | ICD-10-CM | POA: Diagnosis not present

## 2016-08-09 DIAGNOSIS — J449 Chronic obstructive pulmonary disease, unspecified: Secondary | ICD-10-CM | POA: Diagnosis not present

## 2016-08-15 DIAGNOSIS — H44521 Atrophy of globe, right eye: Secondary | ICD-10-CM | POA: Diagnosis not present

## 2016-08-15 DIAGNOSIS — H04123 Dry eye syndrome of bilateral lacrimal glands: Secondary | ICD-10-CM | POA: Diagnosis not present

## 2016-09-11 DIAGNOSIS — C158 Malignant neoplasm of overlapping sites of esophagus: Secondary | ICD-10-CM | POA: Diagnosis not present

## 2016-09-11 DIAGNOSIS — Z8501 Personal history of malignant neoplasm of esophagus: Secondary | ICD-10-CM | POA: Diagnosis not present

## 2016-10-04 DIAGNOSIS — H579 Unspecified disorder of eye and adnexa: Secondary | ICD-10-CM | POA: Diagnosis not present

## 2016-10-04 DIAGNOSIS — Z79899 Other long term (current) drug therapy: Secondary | ICD-10-CM | POA: Diagnosis not present

## 2016-10-04 DIAGNOSIS — R011 Cardiac murmur, unspecified: Secondary | ICD-10-CM | POA: Diagnosis not present

## 2016-10-04 DIAGNOSIS — H5461 Unqualified visual loss, right eye, normal vision left eye: Secondary | ICD-10-CM | POA: Diagnosis not present

## 2016-10-04 DIAGNOSIS — Z87891 Personal history of nicotine dependence: Secondary | ICD-10-CM | POA: Diagnosis not present

## 2016-10-04 DIAGNOSIS — K08109 Complete loss of teeth, unspecified cause, unspecified class: Secondary | ICD-10-CM | POA: Diagnosis not present

## 2016-10-04 DIAGNOSIS — H04129 Dry eye syndrome of unspecified lacrimal gland: Secondary | ICD-10-CM | POA: Diagnosis not present

## 2016-10-04 DIAGNOSIS — Z0000 Encounter for general adult medical examination without abnormal findings: Secondary | ICD-10-CM | POA: Diagnosis not present

## 2016-10-04 DIAGNOSIS — I10 Essential (primary) hypertension: Secondary | ICD-10-CM | POA: Diagnosis not present

## 2016-10-04 DIAGNOSIS — K219 Gastro-esophageal reflux disease without esophagitis: Secondary | ICD-10-CM | POA: Diagnosis not present

## 2016-10-04 DIAGNOSIS — Z972 Presence of dental prosthetic device (complete) (partial): Secondary | ICD-10-CM | POA: Diagnosis not present

## 2016-10-04 DIAGNOSIS — Z974 Presence of external hearing-aid: Secondary | ICD-10-CM | POA: Diagnosis not present

## 2016-10-04 DIAGNOSIS — E785 Hyperlipidemia, unspecified: Secondary | ICD-10-CM | POA: Diagnosis not present

## 2016-10-04 DIAGNOSIS — H9113 Presbycusis, bilateral: Secondary | ICD-10-CM | POA: Diagnosis not present

## 2017-01-04 DIAGNOSIS — R1010 Upper abdominal pain, unspecified: Secondary | ICD-10-CM | POA: Diagnosis not present

## 2017-01-04 DIAGNOSIS — Z8501 Personal history of malignant neoplasm of esophagus: Secondary | ICD-10-CM | POA: Diagnosis not present

## 2017-01-04 DIAGNOSIS — K921 Melena: Secondary | ICD-10-CM | POA: Diagnosis not present

## 2017-01-17 DIAGNOSIS — K921 Melena: Secondary | ICD-10-CM | POA: Diagnosis not present

## 2017-01-17 DIAGNOSIS — Z8501 Personal history of malignant neoplasm of esophagus: Secondary | ICD-10-CM | POA: Diagnosis not present

## 2017-01-17 DIAGNOSIS — R1010 Upper abdominal pain, unspecified: Secondary | ICD-10-CM | POA: Diagnosis not present

## 2017-01-17 DIAGNOSIS — K228 Other specified diseases of esophagus: Secondary | ICD-10-CM | POA: Diagnosis not present

## 2017-01-17 DIAGNOSIS — D128 Benign neoplasm of rectum: Secondary | ICD-10-CM | POA: Diagnosis not present

## 2017-01-17 DIAGNOSIS — K635 Polyp of colon: Secondary | ICD-10-CM | POA: Diagnosis not present

## 2017-01-17 DIAGNOSIS — R1013 Epigastric pain: Secondary | ICD-10-CM | POA: Diagnosis not present

## 2017-01-17 DIAGNOSIS — D122 Benign neoplasm of ascending colon: Secondary | ICD-10-CM | POA: Diagnosis not present

## 2017-01-17 DIAGNOSIS — K208 Other esophagitis: Secondary | ICD-10-CM | POA: Diagnosis not present

## 2017-01-17 DIAGNOSIS — K633 Ulcer of intestine: Secondary | ICD-10-CM | POA: Diagnosis not present

## 2017-02-13 DIAGNOSIS — Z23 Encounter for immunization: Secondary | ICD-10-CM | POA: Diagnosis not present

## 2017-02-13 DIAGNOSIS — E785 Hyperlipidemia, unspecified: Secondary | ICD-10-CM | POA: Diagnosis not present

## 2017-02-13 DIAGNOSIS — Z0000 Encounter for general adult medical examination without abnormal findings: Secondary | ICD-10-CM | POA: Diagnosis not present

## 2017-02-13 DIAGNOSIS — I10 Essential (primary) hypertension: Secondary | ICD-10-CM | POA: Diagnosis not present

## 2017-02-13 DIAGNOSIS — R5381 Other malaise: Secondary | ICD-10-CM | POA: Diagnosis not present

## 2017-02-13 DIAGNOSIS — D539 Nutritional anemia, unspecified: Secondary | ICD-10-CM | POA: Diagnosis not present

## 2017-02-13 DIAGNOSIS — Z1389 Encounter for screening for other disorder: Secondary | ICD-10-CM | POA: Diagnosis not present

## 2017-02-13 DIAGNOSIS — Z125 Encounter for screening for malignant neoplasm of prostate: Secondary | ICD-10-CM | POA: Diagnosis not present

## 2017-02-13 DIAGNOSIS — Z9181 History of falling: Secondary | ICD-10-CM | POA: Diagnosis not present

## 2017-02-14 DIAGNOSIS — Z6821 Body mass index (BMI) 21.0-21.9, adult: Secondary | ICD-10-CM | POA: Diagnosis not present

## 2017-02-14 DIAGNOSIS — J449 Chronic obstructive pulmonary disease, unspecified: Secondary | ICD-10-CM | POA: Diagnosis not present

## 2017-02-14 DIAGNOSIS — E785 Hyperlipidemia, unspecified: Secondary | ICD-10-CM | POA: Diagnosis not present

## 2017-02-14 DIAGNOSIS — D539 Nutritional anemia, unspecified: Secondary | ICD-10-CM | POA: Diagnosis not present

## 2017-02-14 DIAGNOSIS — I10 Essential (primary) hypertension: Secondary | ICD-10-CM | POA: Diagnosis not present

## 2017-02-20 DIAGNOSIS — H44521 Atrophy of globe, right eye: Secondary | ICD-10-CM | POA: Diagnosis not present

## 2017-02-20 DIAGNOSIS — H43392 Other vitreous opacities, left eye: Secondary | ICD-10-CM | POA: Diagnosis not present

## 2017-02-20 DIAGNOSIS — H524 Presbyopia: Secondary | ICD-10-CM | POA: Diagnosis not present

## 2017-02-20 DIAGNOSIS — H26492 Other secondary cataract, left eye: Secondary | ICD-10-CM | POA: Diagnosis not present

## 2017-02-20 DIAGNOSIS — H16223 Keratoconjunctivitis sicca, not specified as Sjogren's, bilateral: Secondary | ICD-10-CM | POA: Diagnosis not present

## 2017-02-23 DIAGNOSIS — Z23 Encounter for immunization: Secondary | ICD-10-CM | POA: Diagnosis not present

## 2017-03-05 DIAGNOSIS — E7800 Pure hypercholesterolemia, unspecified: Secondary | ICD-10-CM | POA: Diagnosis not present

## 2017-03-05 DIAGNOSIS — Z7982 Long term (current) use of aspirin: Secondary | ICD-10-CM | POA: Diagnosis not present

## 2017-03-05 DIAGNOSIS — I493 Ventricular premature depolarization: Secondary | ICD-10-CM | POA: Diagnosis not present

## 2017-03-05 DIAGNOSIS — Z952 Presence of prosthetic heart valve: Secondary | ICD-10-CM | POA: Diagnosis not present

## 2017-03-12 DIAGNOSIS — H26492 Other secondary cataract, left eye: Secondary | ICD-10-CM | POA: Diagnosis not present

## 2017-03-14 DIAGNOSIS — C160 Malignant neoplasm of cardia: Secondary | ICD-10-CM | POA: Diagnosis not present

## 2017-03-14 DIAGNOSIS — Z8509 Personal history of malignant neoplasm of other digestive organs: Secondary | ICD-10-CM | POA: Diagnosis not present

## 2017-08-15 DIAGNOSIS — E785 Hyperlipidemia, unspecified: Secondary | ICD-10-CM | POA: Diagnosis not present

## 2017-08-15 DIAGNOSIS — Z1389 Encounter for screening for other disorder: Secondary | ICD-10-CM | POA: Diagnosis not present

## 2017-08-15 DIAGNOSIS — C160 Malignant neoplasm of cardia: Secondary | ICD-10-CM | POA: Diagnosis not present

## 2017-08-15 DIAGNOSIS — Z1331 Encounter for screening for depression: Secondary | ICD-10-CM | POA: Diagnosis not present

## 2017-08-15 DIAGNOSIS — D539 Nutritional anemia, unspecified: Secondary | ICD-10-CM | POA: Diagnosis not present

## 2017-08-15 DIAGNOSIS — I10 Essential (primary) hypertension: Secondary | ICD-10-CM | POA: Diagnosis not present

## 2017-08-15 DIAGNOSIS — J449 Chronic obstructive pulmonary disease, unspecified: Secondary | ICD-10-CM | POA: Diagnosis not present

## 2017-09-11 DIAGNOSIS — Z8509 Personal history of malignant neoplasm of other digestive organs: Secondary | ICD-10-CM | POA: Diagnosis not present

## 2017-09-11 DIAGNOSIS — Z8501 Personal history of malignant neoplasm of esophagus: Secondary | ICD-10-CM | POA: Diagnosis not present

## 2017-09-17 DIAGNOSIS — H16223 Keratoconjunctivitis sicca, not specified as Sjogren's, bilateral: Secondary | ICD-10-CM | POA: Diagnosis not present

## 2017-09-17 DIAGNOSIS — H04123 Dry eye syndrome of bilateral lacrimal glands: Secondary | ICD-10-CM | POA: Diagnosis not present

## 2017-09-17 DIAGNOSIS — H524 Presbyopia: Secondary | ICD-10-CM | POA: Diagnosis not present

## 2018-02-14 DIAGNOSIS — J449 Chronic obstructive pulmonary disease, unspecified: Secondary | ICD-10-CM | POA: Diagnosis not present

## 2018-02-14 DIAGNOSIS — Z136 Encounter for screening for cardiovascular disorders: Secondary | ICD-10-CM | POA: Diagnosis not present

## 2018-02-14 DIAGNOSIS — I10 Essential (primary) hypertension: Secondary | ICD-10-CM | POA: Diagnosis not present

## 2018-02-14 DIAGNOSIS — Z139 Encounter for screening, unspecified: Secondary | ICD-10-CM | POA: Diagnosis not present

## 2018-02-14 DIAGNOSIS — E785 Hyperlipidemia, unspecified: Secondary | ICD-10-CM | POA: Diagnosis not present

## 2018-02-14 DIAGNOSIS — Z0000 Encounter for general adult medical examination without abnormal findings: Secondary | ICD-10-CM | POA: Diagnosis not present

## 2018-02-14 DIAGNOSIS — Z23 Encounter for immunization: Secondary | ICD-10-CM | POA: Diagnosis not present

## 2018-02-14 DIAGNOSIS — Z1339 Encounter for screening examination for other mental health and behavioral disorders: Secondary | ICD-10-CM | POA: Diagnosis not present

## 2018-02-14 DIAGNOSIS — I6529 Occlusion and stenosis of unspecified carotid artery: Secondary | ICD-10-CM | POA: Diagnosis not present

## 2018-02-14 DIAGNOSIS — D539 Nutritional anemia, unspecified: Secondary | ICD-10-CM | POA: Diagnosis not present

## 2018-03-06 DIAGNOSIS — I6529 Occlusion and stenosis of unspecified carotid artery: Secondary | ICD-10-CM | POA: Diagnosis not present

## 2018-03-06 DIAGNOSIS — I6523 Occlusion and stenosis of bilateral carotid arteries: Secondary | ICD-10-CM | POA: Diagnosis not present

## 2018-03-19 DIAGNOSIS — H16223 Keratoconjunctivitis sicca, not specified as Sjogren's, bilateral: Secondary | ICD-10-CM | POA: Diagnosis not present

## 2018-03-19 DIAGNOSIS — H524 Presbyopia: Secondary | ICD-10-CM | POA: Diagnosis not present

## 2018-03-19 DIAGNOSIS — H04123 Dry eye syndrome of bilateral lacrimal glands: Secondary | ICD-10-CM | POA: Diagnosis not present

## 2018-03-19 DIAGNOSIS — H44521 Atrophy of globe, right eye: Secondary | ICD-10-CM | POA: Diagnosis not present

## 2018-03-25 DIAGNOSIS — Z6821 Body mass index (BMI) 21.0-21.9, adult: Secondary | ICD-10-CM | POA: Diagnosis not present

## 2018-03-25 DIAGNOSIS — J208 Acute bronchitis due to other specified organisms: Secondary | ICD-10-CM | POA: Diagnosis not present

## 2018-05-17 DIAGNOSIS — I10 Essential (primary) hypertension: Secondary | ICD-10-CM | POA: Diagnosis not present

## 2018-05-17 DIAGNOSIS — I350 Nonrheumatic aortic (valve) stenosis: Secondary | ICD-10-CM | POA: Diagnosis not present

## 2018-05-17 DIAGNOSIS — E7800 Pure hypercholesterolemia, unspecified: Secondary | ICD-10-CM | POA: Diagnosis not present

## 2018-05-17 DIAGNOSIS — I6529 Occlusion and stenosis of unspecified carotid artery: Secondary | ICD-10-CM | POA: Diagnosis not present

## 2018-05-17 DIAGNOSIS — I493 Ventricular premature depolarization: Secondary | ICD-10-CM | POA: Diagnosis not present

## 2018-05-30 DIAGNOSIS — I350 Nonrheumatic aortic (valve) stenosis: Secondary | ICD-10-CM | POA: Diagnosis not present

## 2018-08-30 DIAGNOSIS — Z1331 Encounter for screening for depression: Secondary | ICD-10-CM | POA: Diagnosis not present

## 2018-08-30 DIAGNOSIS — I10 Essential (primary) hypertension: Secondary | ICD-10-CM | POA: Diagnosis not present

## 2018-08-30 DIAGNOSIS — J449 Chronic obstructive pulmonary disease, unspecified: Secondary | ICD-10-CM | POA: Diagnosis not present

## 2018-08-30 DIAGNOSIS — Z9181 History of falling: Secondary | ICD-10-CM | POA: Diagnosis not present

## 2018-08-30 DIAGNOSIS — E785 Hyperlipidemia, unspecified: Secondary | ICD-10-CM | POA: Diagnosis not present

## 2018-08-30 DIAGNOSIS — I6529 Occlusion and stenosis of unspecified carotid artery: Secondary | ICD-10-CM | POA: Diagnosis not present

## 2018-08-30 DIAGNOSIS — R1310 Dysphagia, unspecified: Secondary | ICD-10-CM | POA: Diagnosis not present

## 2018-09-06 DIAGNOSIS — R1310 Dysphagia, unspecified: Secondary | ICD-10-CM | POA: Diagnosis not present

## 2018-09-11 DIAGNOSIS — K2270 Barrett's esophagus without dysplasia: Secondary | ICD-10-CM | POA: Diagnosis not present

## 2018-09-11 DIAGNOSIS — R1310 Dysphagia, unspecified: Secondary | ICD-10-CM | POA: Diagnosis not present

## 2018-09-11 DIAGNOSIS — K2950 Unspecified chronic gastritis without bleeding: Secondary | ICD-10-CM | POA: Diagnosis not present

## 2018-09-11 DIAGNOSIS — K228 Other specified diseases of esophagus: Secondary | ICD-10-CM | POA: Diagnosis not present

## 2018-09-11 DIAGNOSIS — Z8501 Personal history of malignant neoplasm of esophagus: Secondary | ICD-10-CM | POA: Diagnosis not present

## 2018-10-10 DIAGNOSIS — K913 Postprocedural intestinal obstruction, unspecified as to partial versus complete: Secondary | ICD-10-CM | POA: Diagnosis not present

## 2018-11-25 DIAGNOSIS — E785 Hyperlipidemia, unspecified: Secondary | ICD-10-CM | POA: Diagnosis not present

## 2018-11-25 DIAGNOSIS — J449 Chronic obstructive pulmonary disease, unspecified: Secondary | ICD-10-CM | POA: Diagnosis not present

## 2018-11-25 DIAGNOSIS — Z6821 Body mass index (BMI) 21.0-21.9, adult: Secondary | ICD-10-CM | POA: Diagnosis not present

## 2018-11-25 DIAGNOSIS — R1310 Dysphagia, unspecified: Secondary | ICD-10-CM | POA: Diagnosis not present

## 2018-11-25 DIAGNOSIS — I6529 Occlusion and stenosis of unspecified carotid artery: Secondary | ICD-10-CM | POA: Diagnosis not present

## 2018-11-25 DIAGNOSIS — D539 Nutritional anemia, unspecified: Secondary | ICD-10-CM | POA: Diagnosis not present

## 2018-11-25 DIAGNOSIS — I10 Essential (primary) hypertension: Secondary | ICD-10-CM | POA: Diagnosis not present

## 2019-02-19 DIAGNOSIS — Z9181 History of falling: Secondary | ICD-10-CM | POA: Diagnosis not present

## 2019-02-19 DIAGNOSIS — Z139 Encounter for screening, unspecified: Secondary | ICD-10-CM | POA: Diagnosis not present

## 2019-02-19 DIAGNOSIS — E785 Hyperlipidemia, unspecified: Secondary | ICD-10-CM | POA: Diagnosis not present

## 2019-02-19 DIAGNOSIS — Z23 Encounter for immunization: Secondary | ICD-10-CM | POA: Diagnosis not present

## 2019-02-19 DIAGNOSIS — Z1331 Encounter for screening for depression: Secondary | ICD-10-CM | POA: Diagnosis not present

## 2019-02-19 DIAGNOSIS — Z0000 Encounter for general adult medical examination without abnormal findings: Secondary | ICD-10-CM | POA: Diagnosis not present

## 2019-02-20 DIAGNOSIS — B9689 Other specified bacterial agents as the cause of diseases classified elsewhere: Secondary | ICD-10-CM | POA: Diagnosis not present

## 2019-02-20 DIAGNOSIS — S8991XA Unspecified injury of right lower leg, initial encounter: Secondary | ICD-10-CM | POA: Diagnosis not present

## 2019-02-20 DIAGNOSIS — L089 Local infection of the skin and subcutaneous tissue, unspecified: Secondary | ICD-10-CM | POA: Diagnosis not present

## 2019-02-20 DIAGNOSIS — T148XXA Other injury of unspecified body region, initial encounter: Secondary | ICD-10-CM | POA: Diagnosis not present

## 2019-02-20 DIAGNOSIS — Z6821 Body mass index (BMI) 21.0-21.9, adult: Secondary | ICD-10-CM | POA: Diagnosis not present

## 2019-02-26 DIAGNOSIS — S81801A Unspecified open wound, right lower leg, initial encounter: Secondary | ICD-10-CM | POA: Diagnosis not present

## 2019-03-24 DIAGNOSIS — H52202 Unspecified astigmatism, left eye: Secondary | ICD-10-CM | POA: Diagnosis not present

## 2019-03-24 DIAGNOSIS — H5202 Hypermetropia, left eye: Secondary | ICD-10-CM | POA: Diagnosis not present

## 2019-03-24 DIAGNOSIS — H16223 Keratoconjunctivitis sicca, not specified as Sjogren's, bilateral: Secondary | ICD-10-CM | POA: Diagnosis not present

## 2019-03-24 DIAGNOSIS — Z961 Presence of intraocular lens: Secondary | ICD-10-CM | POA: Diagnosis not present

## 2019-03-24 DIAGNOSIS — H43392 Other vitreous opacities, left eye: Secondary | ICD-10-CM | POA: Diagnosis not present

## 2019-03-24 DIAGNOSIS — H44521 Atrophy of globe, right eye: Secondary | ICD-10-CM | POA: Diagnosis not present

## 2019-03-24 DIAGNOSIS — H524 Presbyopia: Secondary | ICD-10-CM | POA: Diagnosis not present

## 2019-04-28 DIAGNOSIS — Z20828 Contact with and (suspected) exposure to other viral communicable diseases: Secondary | ICD-10-CM | POA: Diagnosis not present

## 2019-05-29 DIAGNOSIS — J449 Chronic obstructive pulmonary disease, unspecified: Secondary | ICD-10-CM | POA: Diagnosis not present

## 2019-05-29 DIAGNOSIS — Z6821 Body mass index (BMI) 21.0-21.9, adult: Secondary | ICD-10-CM | POA: Diagnosis not present

## 2019-05-29 DIAGNOSIS — R634 Abnormal weight loss: Secondary | ICD-10-CM | POA: Diagnosis not present

## 2019-05-29 DIAGNOSIS — E785 Hyperlipidemia, unspecified: Secondary | ICD-10-CM | POA: Diagnosis not present

## 2019-05-29 DIAGNOSIS — I10 Essential (primary) hypertension: Secondary | ICD-10-CM | POA: Diagnosis not present

## 2019-08-29 DIAGNOSIS — R634 Abnormal weight loss: Secondary | ICD-10-CM | POA: Diagnosis not present

## 2019-08-29 DIAGNOSIS — I10 Essential (primary) hypertension: Secondary | ICD-10-CM | POA: Diagnosis not present

## 2019-08-29 DIAGNOSIS — E785 Hyperlipidemia, unspecified: Secondary | ICD-10-CM | POA: Diagnosis not present

## 2019-08-29 DIAGNOSIS — Z6821 Body mass index (BMI) 21.0-21.9, adult: Secondary | ICD-10-CM | POA: Diagnosis not present

## 2019-08-29 DIAGNOSIS — J449 Chronic obstructive pulmonary disease, unspecified: Secondary | ICD-10-CM | POA: Diagnosis not present

## 2019-11-03 DIAGNOSIS — R350 Frequency of micturition: Secondary | ICD-10-CM | POA: Diagnosis not present

## 2019-11-03 DIAGNOSIS — Z6820 Body mass index (BMI) 20.0-20.9, adult: Secondary | ICD-10-CM | POA: Diagnosis not present

## 2019-11-03 DIAGNOSIS — R358 Other polyuria: Secondary | ICD-10-CM | POA: Diagnosis not present

## 2019-12-04 DIAGNOSIS — Z6820 Body mass index (BMI) 20.0-20.9, adult: Secondary | ICD-10-CM | POA: Diagnosis not present

## 2019-12-04 DIAGNOSIS — R358 Other polyuria: Secondary | ICD-10-CM | POA: Diagnosis not present

## 2019-12-04 DIAGNOSIS — R350 Frequency of micturition: Secondary | ICD-10-CM | POA: Diagnosis not present

## 2019-12-20 DIAGNOSIS — S60921A Unspecified superficial injury of right hand, initial encounter: Secondary | ICD-10-CM | POA: Diagnosis not present

## 2019-12-26 DIAGNOSIS — Z6821 Body mass index (BMI) 21.0-21.9, adult: Secondary | ICD-10-CM | POA: Diagnosis not present

## 2019-12-26 DIAGNOSIS — R634 Abnormal weight loss: Secondary | ICD-10-CM | POA: Diagnosis not present

## 2019-12-26 DIAGNOSIS — J449 Chronic obstructive pulmonary disease, unspecified: Secondary | ICD-10-CM | POA: Diagnosis not present

## 2019-12-26 DIAGNOSIS — K219 Gastro-esophageal reflux disease without esophagitis: Secondary | ICD-10-CM | POA: Diagnosis not present

## 2019-12-26 DIAGNOSIS — R350 Frequency of micturition: Secondary | ICD-10-CM | POA: Diagnosis not present

## 2019-12-26 DIAGNOSIS — E785 Hyperlipidemia, unspecified: Secondary | ICD-10-CM | POA: Diagnosis not present

## 2019-12-26 DIAGNOSIS — S60921A Unspecified superficial injury of right hand, initial encounter: Secondary | ICD-10-CM | POA: Diagnosis not present

## 2019-12-26 DIAGNOSIS — I10 Essential (primary) hypertension: Secondary | ICD-10-CM | POA: Diagnosis not present

## 2019-12-26 DIAGNOSIS — R358 Other polyuria: Secondary | ICD-10-CM | POA: Diagnosis not present

## 2020-02-17 DIAGNOSIS — R1310 Dysphagia, unspecified: Secondary | ICD-10-CM | POA: Diagnosis not present

## 2020-02-20 DIAGNOSIS — Z23 Encounter for immunization: Secondary | ICD-10-CM | POA: Diagnosis not present

## 2020-02-26 DIAGNOSIS — Z139 Encounter for screening, unspecified: Secondary | ICD-10-CM | POA: Diagnosis not present

## 2020-02-26 DIAGNOSIS — Z9181 History of falling: Secondary | ICD-10-CM | POA: Diagnosis not present

## 2020-02-26 DIAGNOSIS — Z0000 Encounter for general adult medical examination without abnormal findings: Secondary | ICD-10-CM | POA: Diagnosis not present

## 2020-02-26 DIAGNOSIS — E785 Hyperlipidemia, unspecified: Secondary | ICD-10-CM | POA: Diagnosis not present

## 2020-02-26 DIAGNOSIS — Z1331 Encounter for screening for depression: Secondary | ICD-10-CM | POA: Diagnosis not present

## 2020-04-05 DIAGNOSIS — H16223 Keratoconjunctivitis sicca, not specified as Sjogren's, bilateral: Secondary | ICD-10-CM | POA: Diagnosis not present

## 2020-04-05 DIAGNOSIS — H44521 Atrophy of globe, right eye: Secondary | ICD-10-CM | POA: Diagnosis not present

## 2020-04-05 DIAGNOSIS — H43392 Other vitreous opacities, left eye: Secondary | ICD-10-CM | POA: Diagnosis not present

## 2020-04-05 DIAGNOSIS — H52202 Unspecified astigmatism, left eye: Secondary | ICD-10-CM | POA: Diagnosis not present

## 2020-04-05 DIAGNOSIS — H5202 Hypermetropia, left eye: Secondary | ICD-10-CM | POA: Diagnosis not present

## 2020-04-05 DIAGNOSIS — Z961 Presence of intraocular lens: Secondary | ICD-10-CM | POA: Diagnosis not present

## 2020-04-05 DIAGNOSIS — H524 Presbyopia: Secondary | ICD-10-CM | POA: Diagnosis not present

## 2020-05-03 DIAGNOSIS — K219 Gastro-esophageal reflux disease without esophagitis: Secondary | ICD-10-CM | POA: Diagnosis not present

## 2020-05-03 DIAGNOSIS — R634 Abnormal weight loss: Secondary | ICD-10-CM | POA: Diagnosis not present

## 2020-05-03 DIAGNOSIS — E785 Hyperlipidemia, unspecified: Secondary | ICD-10-CM | POA: Diagnosis not present

## 2020-05-03 DIAGNOSIS — I10 Essential (primary) hypertension: Secondary | ICD-10-CM | POA: Diagnosis not present

## 2020-05-03 DIAGNOSIS — Z6821 Body mass index (BMI) 21.0-21.9, adult: Secondary | ICD-10-CM | POA: Diagnosis not present

## 2020-05-03 DIAGNOSIS — J449 Chronic obstructive pulmonary disease, unspecified: Secondary | ICD-10-CM | POA: Diagnosis not present

## 2020-09-02 DIAGNOSIS — Z6821 Body mass index (BMI) 21.0-21.9, adult: Secondary | ICD-10-CM | POA: Diagnosis not present

## 2020-09-02 DIAGNOSIS — R350 Frequency of micturition: Secondary | ICD-10-CM | POA: Diagnosis not present

## 2020-09-02 DIAGNOSIS — K219 Gastro-esophageal reflux disease without esophagitis: Secondary | ICD-10-CM | POA: Diagnosis not present

## 2020-09-02 DIAGNOSIS — R3589 Other polyuria: Secondary | ICD-10-CM | POA: Diagnosis not present

## 2020-09-02 DIAGNOSIS — I10 Essential (primary) hypertension: Secondary | ICD-10-CM | POA: Diagnosis not present

## 2020-09-02 DIAGNOSIS — R634 Abnormal weight loss: Secondary | ICD-10-CM | POA: Diagnosis not present

## 2020-09-02 DIAGNOSIS — J449 Chronic obstructive pulmonary disease, unspecified: Secondary | ICD-10-CM | POA: Diagnosis not present

## 2020-09-02 DIAGNOSIS — E785 Hyperlipidemia, unspecified: Secondary | ICD-10-CM | POA: Diagnosis not present

## 2020-09-04 DIAGNOSIS — M79641 Pain in right hand: Secondary | ICD-10-CM | POA: Diagnosis not present

## 2021-01-03 DIAGNOSIS — R634 Abnormal weight loss: Secondary | ICD-10-CM | POA: Diagnosis not present

## 2021-01-03 DIAGNOSIS — R350 Frequency of micturition: Secondary | ICD-10-CM | POA: Diagnosis not present

## 2021-01-03 DIAGNOSIS — Z6821 Body mass index (BMI) 21.0-21.9, adult: Secondary | ICD-10-CM | POA: Diagnosis not present

## 2021-01-03 DIAGNOSIS — R413 Other amnesia: Secondary | ICD-10-CM | POA: Diagnosis not present

## 2021-01-03 DIAGNOSIS — I10 Essential (primary) hypertension: Secondary | ICD-10-CM | POA: Diagnosis not present

## 2021-01-03 DIAGNOSIS — J449 Chronic obstructive pulmonary disease, unspecified: Secondary | ICD-10-CM | POA: Diagnosis not present

## 2021-01-03 DIAGNOSIS — E785 Hyperlipidemia, unspecified: Secondary | ICD-10-CM | POA: Diagnosis not present

## 2021-01-03 DIAGNOSIS — R3589 Other polyuria: Secondary | ICD-10-CM | POA: Diagnosis not present

## 2021-01-03 DIAGNOSIS — K219 Gastro-esophageal reflux disease without esophagitis: Secondary | ICD-10-CM | POA: Diagnosis not present

## 2021-01-28 DIAGNOSIS — M4312 Spondylolisthesis, cervical region: Secondary | ICD-10-CM | POA: Diagnosis not present

## 2021-01-28 DIAGNOSIS — Z043 Encounter for examination and observation following other accident: Secondary | ICD-10-CM | POA: Diagnosis not present

## 2021-01-28 DIAGNOSIS — S4991XA Unspecified injury of right shoulder and upper arm, initial encounter: Secondary | ICD-10-CM | POA: Diagnosis not present

## 2021-01-28 DIAGNOSIS — S0003XA Contusion of scalp, initial encounter: Secondary | ICD-10-CM | POA: Diagnosis not present

## 2021-01-28 DIAGNOSIS — M5031 Other cervical disc degeneration,  high cervical region: Secondary | ICD-10-CM | POA: Diagnosis not present

## 2021-01-28 DIAGNOSIS — M19011 Primary osteoarthritis, right shoulder: Secondary | ICD-10-CM | POA: Diagnosis not present

## 2021-01-28 DIAGNOSIS — M50321 Other cervical disc degeneration at C4-C5 level: Secondary | ICD-10-CM | POA: Diagnosis not present

## 2021-02-03 DIAGNOSIS — Z23 Encounter for immunization: Secondary | ICD-10-CM | POA: Diagnosis not present

## 2021-02-03 DIAGNOSIS — F09 Unspecified mental disorder due to known physiological condition: Secondary | ICD-10-CM | POA: Diagnosis not present

## 2021-02-03 DIAGNOSIS — Z6821 Body mass index (BMI) 21.0-21.9, adult: Secondary | ICD-10-CM | POA: Diagnosis not present

## 2021-02-03 DIAGNOSIS — I10 Essential (primary) hypertension: Secondary | ICD-10-CM | POA: Diagnosis not present

## 2021-02-03 DIAGNOSIS — R262 Difficulty in walking, not elsewhere classified: Secondary | ICD-10-CM | POA: Diagnosis not present

## 2021-02-21 DIAGNOSIS — M21372 Foot drop, left foot: Secondary | ICD-10-CM | POA: Diagnosis not present

## 2021-02-21 DIAGNOSIS — M6281 Muscle weakness (generalized): Secondary | ICD-10-CM | POA: Diagnosis not present

## 2021-02-21 DIAGNOSIS — R296 Repeated falls: Secondary | ICD-10-CM | POA: Diagnosis not present

## 2021-02-21 DIAGNOSIS — R2689 Other abnormalities of gait and mobility: Secondary | ICD-10-CM | POA: Diagnosis not present

## 2021-02-24 DIAGNOSIS — M6281 Muscle weakness (generalized): Secondary | ICD-10-CM | POA: Diagnosis not present

## 2021-02-24 DIAGNOSIS — R296 Repeated falls: Secondary | ICD-10-CM | POA: Diagnosis not present

## 2021-02-24 DIAGNOSIS — R2689 Other abnormalities of gait and mobility: Secondary | ICD-10-CM | POA: Diagnosis not present

## 2021-02-24 DIAGNOSIS — M21372 Foot drop, left foot: Secondary | ICD-10-CM | POA: Diagnosis not present

## 2021-02-28 DIAGNOSIS — R2689 Other abnormalities of gait and mobility: Secondary | ICD-10-CM | POA: Diagnosis not present

## 2021-02-28 DIAGNOSIS — M21372 Foot drop, left foot: Secondary | ICD-10-CM | POA: Diagnosis not present

## 2021-02-28 DIAGNOSIS — M6281 Muscle weakness (generalized): Secondary | ICD-10-CM | POA: Diagnosis not present

## 2021-02-28 DIAGNOSIS — R296 Repeated falls: Secondary | ICD-10-CM | POA: Diagnosis not present

## 2021-03-01 DIAGNOSIS — Z139 Encounter for screening, unspecified: Secondary | ICD-10-CM | POA: Diagnosis not present

## 2021-03-01 DIAGNOSIS — Z0000 Encounter for general adult medical examination without abnormal findings: Secondary | ICD-10-CM | POA: Diagnosis not present

## 2021-03-01 DIAGNOSIS — Z1331 Encounter for screening for depression: Secondary | ICD-10-CM | POA: Diagnosis not present

## 2021-03-01 DIAGNOSIS — Z9181 History of falling: Secondary | ICD-10-CM | POA: Diagnosis not present

## 2021-03-01 DIAGNOSIS — E785 Hyperlipidemia, unspecified: Secondary | ICD-10-CM | POA: Diagnosis not present

## 2021-03-03 DIAGNOSIS — M21372 Foot drop, left foot: Secondary | ICD-10-CM | POA: Diagnosis not present

## 2021-03-03 DIAGNOSIS — M6281 Muscle weakness (generalized): Secondary | ICD-10-CM | POA: Diagnosis not present

## 2021-03-03 DIAGNOSIS — R296 Repeated falls: Secondary | ICD-10-CM | POA: Diagnosis not present

## 2021-03-03 DIAGNOSIS — R2689 Other abnormalities of gait and mobility: Secondary | ICD-10-CM | POA: Diagnosis not present

## 2021-03-07 DIAGNOSIS — R262 Difficulty in walking, not elsewhere classified: Secondary | ICD-10-CM | POA: Diagnosis not present

## 2021-03-07 DIAGNOSIS — I10 Essential (primary) hypertension: Secondary | ICD-10-CM | POA: Diagnosis not present

## 2021-03-07 DIAGNOSIS — M21372 Foot drop, left foot: Secondary | ICD-10-CM | POA: Diagnosis not present

## 2021-03-07 DIAGNOSIS — R42 Dizziness and giddiness: Secondary | ICD-10-CM | POA: Diagnosis not present

## 2021-03-10 DIAGNOSIS — M6281 Muscle weakness (generalized): Secondary | ICD-10-CM | POA: Diagnosis not present

## 2021-03-10 DIAGNOSIS — M21372 Foot drop, left foot: Secondary | ICD-10-CM | POA: Diagnosis not present

## 2021-03-10 DIAGNOSIS — R296 Repeated falls: Secondary | ICD-10-CM | POA: Diagnosis not present

## 2021-03-10 DIAGNOSIS — R2689 Other abnormalities of gait and mobility: Secondary | ICD-10-CM | POA: Diagnosis not present

## 2021-03-14 DIAGNOSIS — M6281 Muscle weakness (generalized): Secondary | ICD-10-CM | POA: Diagnosis not present

## 2021-03-14 DIAGNOSIS — R296 Repeated falls: Secondary | ICD-10-CM | POA: Diagnosis not present

## 2021-03-14 DIAGNOSIS — M21372 Foot drop, left foot: Secondary | ICD-10-CM | POA: Diagnosis not present

## 2021-03-14 DIAGNOSIS — R2689 Other abnormalities of gait and mobility: Secondary | ICD-10-CM | POA: Diagnosis not present

## 2021-03-17 DIAGNOSIS — Z6820 Body mass index (BMI) 20.0-20.9, adult: Secondary | ICD-10-CM | POA: Diagnosis not present

## 2021-03-17 DIAGNOSIS — I10 Essential (primary) hypertension: Secondary | ICD-10-CM | POA: Diagnosis not present

## 2021-03-17 DIAGNOSIS — M6281 Muscle weakness (generalized): Secondary | ICD-10-CM | POA: Diagnosis not present

## 2021-03-17 DIAGNOSIS — R296 Repeated falls: Secondary | ICD-10-CM | POA: Diagnosis not present

## 2021-03-17 DIAGNOSIS — R2689 Other abnormalities of gait and mobility: Secondary | ICD-10-CM | POA: Diagnosis not present

## 2021-03-17 DIAGNOSIS — M21372 Foot drop, left foot: Secondary | ICD-10-CM | POA: Diagnosis not present

## 2021-03-17 DIAGNOSIS — F09 Unspecified mental disorder due to known physiological condition: Secondary | ICD-10-CM | POA: Diagnosis not present

## 2021-03-21 DIAGNOSIS — R2689 Other abnormalities of gait and mobility: Secondary | ICD-10-CM | POA: Diagnosis not present

## 2021-03-21 DIAGNOSIS — R296 Repeated falls: Secondary | ICD-10-CM | POA: Diagnosis not present

## 2021-03-21 DIAGNOSIS — M6281 Muscle weakness (generalized): Secondary | ICD-10-CM | POA: Diagnosis not present

## 2021-03-21 DIAGNOSIS — M21372 Foot drop, left foot: Secondary | ICD-10-CM | POA: Diagnosis not present

## 2021-03-24 DIAGNOSIS — M6281 Muscle weakness (generalized): Secondary | ICD-10-CM | POA: Diagnosis not present

## 2021-03-24 DIAGNOSIS — R2689 Other abnormalities of gait and mobility: Secondary | ICD-10-CM | POA: Diagnosis not present

## 2021-03-24 DIAGNOSIS — M21372 Foot drop, left foot: Secondary | ICD-10-CM | POA: Diagnosis not present

## 2021-03-24 DIAGNOSIS — R296 Repeated falls: Secondary | ICD-10-CM | POA: Diagnosis not present

## 2021-03-28 DIAGNOSIS — R296 Repeated falls: Secondary | ICD-10-CM | POA: Diagnosis not present

## 2021-03-28 DIAGNOSIS — R2689 Other abnormalities of gait and mobility: Secondary | ICD-10-CM | POA: Diagnosis not present

## 2021-03-28 DIAGNOSIS — M21372 Foot drop, left foot: Secondary | ICD-10-CM | POA: Diagnosis not present

## 2021-03-28 DIAGNOSIS — I10 Essential (primary) hypertension: Secondary | ICD-10-CM | POA: Diagnosis not present

## 2021-03-28 DIAGNOSIS — M6281 Muscle weakness (generalized): Secondary | ICD-10-CM | POA: Diagnosis not present

## 2021-03-30 DIAGNOSIS — R296 Repeated falls: Secondary | ICD-10-CM | POA: Diagnosis not present

## 2021-03-30 DIAGNOSIS — R2689 Other abnormalities of gait and mobility: Secondary | ICD-10-CM | POA: Diagnosis not present

## 2021-03-30 DIAGNOSIS — M21372 Foot drop, left foot: Secondary | ICD-10-CM | POA: Diagnosis not present

## 2021-03-30 DIAGNOSIS — M6281 Muscle weakness (generalized): Secondary | ICD-10-CM | POA: Diagnosis not present

## 2021-04-03 DIAGNOSIS — R1010 Upper abdominal pain, unspecified: Secondary | ICD-10-CM | POA: Diagnosis not present

## 2021-04-03 DIAGNOSIS — D696 Thrombocytopenia, unspecified: Secondary | ICD-10-CM | POA: Diagnosis not present

## 2021-04-03 DIAGNOSIS — R42 Dizziness and giddiness: Secondary | ICD-10-CM | POA: Diagnosis not present

## 2021-04-03 DIAGNOSIS — R1011 Right upper quadrant pain: Secondary | ICD-10-CM | POA: Diagnosis not present

## 2021-04-03 DIAGNOSIS — I444 Left anterior fascicular block: Secondary | ICD-10-CM | POA: Diagnosis not present

## 2021-04-03 DIAGNOSIS — R55 Syncope and collapse: Secondary | ICD-10-CM | POA: Diagnosis not present

## 2021-04-03 DIAGNOSIS — R1110 Vomiting, unspecified: Secondary | ICD-10-CM | POA: Diagnosis not present

## 2021-04-03 DIAGNOSIS — R001 Bradycardia, unspecified: Secondary | ICD-10-CM | POA: Diagnosis not present

## 2021-04-03 DIAGNOSIS — R1084 Generalized abdominal pain: Secondary | ICD-10-CM | POA: Diagnosis not present

## 2021-04-03 DIAGNOSIS — I959 Hypotension, unspecified: Secondary | ICD-10-CM | POA: Diagnosis not present

## 2021-04-03 DIAGNOSIS — R7989 Other specified abnormal findings of blood chemistry: Secondary | ICD-10-CM | POA: Diagnosis not present

## 2021-04-04 DIAGNOSIS — I444 Left anterior fascicular block: Secondary | ICD-10-CM | POA: Diagnosis not present

## 2021-04-04 DIAGNOSIS — R296 Repeated falls: Secondary | ICD-10-CM | POA: Diagnosis not present

## 2021-04-04 DIAGNOSIS — I4891 Unspecified atrial fibrillation: Secondary | ICD-10-CM | POA: Diagnosis not present

## 2021-04-04 DIAGNOSIS — R2689 Other abnormalities of gait and mobility: Secondary | ICD-10-CM | POA: Diagnosis not present

## 2021-04-04 DIAGNOSIS — M6281 Muscle weakness (generalized): Secondary | ICD-10-CM | POA: Diagnosis not present

## 2021-04-04 DIAGNOSIS — M21372 Foot drop, left foot: Secondary | ICD-10-CM | POA: Diagnosis not present

## 2021-04-06 DIAGNOSIS — R2689 Other abnormalities of gait and mobility: Secondary | ICD-10-CM | POA: Diagnosis not present

## 2021-04-06 DIAGNOSIS — M21372 Foot drop, left foot: Secondary | ICD-10-CM | POA: Diagnosis not present

## 2021-04-06 DIAGNOSIS — M6281 Muscle weakness (generalized): Secondary | ICD-10-CM | POA: Diagnosis not present

## 2021-04-06 DIAGNOSIS — R296 Repeated falls: Secondary | ICD-10-CM | POA: Diagnosis not present

## 2021-04-07 DIAGNOSIS — F0390 Unspecified dementia without behavioral disturbance: Secondary | ICD-10-CM | POA: Diagnosis not present

## 2021-04-07 DIAGNOSIS — N3281 Overactive bladder: Secondary | ICD-10-CM | POA: Diagnosis not present

## 2021-04-07 DIAGNOSIS — Z79899 Other long term (current) drug therapy: Secondary | ICD-10-CM | POA: Diagnosis not present

## 2021-04-07 DIAGNOSIS — R350 Frequency of micturition: Secondary | ICD-10-CM | POA: Diagnosis not present

## 2021-04-07 DIAGNOSIS — R351 Nocturia: Secondary | ICD-10-CM | POA: Diagnosis not present

## 2021-04-07 DIAGNOSIS — N401 Enlarged prostate with lower urinary tract symptoms: Secondary | ICD-10-CM | POA: Diagnosis not present

## 2021-04-08 DIAGNOSIS — I10 Essential (primary) hypertension: Secondary | ICD-10-CM | POA: Diagnosis not present

## 2021-04-08 DIAGNOSIS — I493 Ventricular premature depolarization: Secondary | ICD-10-CM | POA: Diagnosis not present

## 2021-04-08 DIAGNOSIS — R55 Syncope and collapse: Secondary | ICD-10-CM | POA: Diagnosis not present

## 2021-04-08 DIAGNOSIS — I350 Nonrheumatic aortic (valve) stenosis: Secondary | ICD-10-CM | POA: Diagnosis not present

## 2021-04-11 DIAGNOSIS — M21372 Foot drop, left foot: Secondary | ICD-10-CM | POA: Diagnosis not present

## 2021-04-11 DIAGNOSIS — H5202 Hypermetropia, left eye: Secondary | ICD-10-CM | POA: Diagnosis not present

## 2021-04-11 DIAGNOSIS — H16223 Keratoconjunctivitis sicca, not specified as Sjogren's, bilateral: Secondary | ICD-10-CM | POA: Diagnosis not present

## 2021-04-11 DIAGNOSIS — H52202 Unspecified astigmatism, left eye: Secondary | ICD-10-CM | POA: Diagnosis not present

## 2021-04-11 DIAGNOSIS — H44521 Atrophy of globe, right eye: Secondary | ICD-10-CM | POA: Diagnosis not present

## 2021-04-11 DIAGNOSIS — H524 Presbyopia: Secondary | ICD-10-CM | POA: Diagnosis not present

## 2021-04-11 DIAGNOSIS — R296 Repeated falls: Secondary | ICD-10-CM | POA: Diagnosis not present

## 2021-04-11 DIAGNOSIS — H43392 Other vitreous opacities, left eye: Secondary | ICD-10-CM | POA: Diagnosis not present

## 2021-04-11 DIAGNOSIS — R2689 Other abnormalities of gait and mobility: Secondary | ICD-10-CM | POA: Diagnosis not present

## 2021-04-11 DIAGNOSIS — Z961 Presence of intraocular lens: Secondary | ICD-10-CM | POA: Diagnosis not present

## 2021-04-11 DIAGNOSIS — M6281 Muscle weakness (generalized): Secondary | ICD-10-CM | POA: Diagnosis not present

## 2021-04-14 DIAGNOSIS — M21372 Foot drop, left foot: Secondary | ICD-10-CM | POA: Diagnosis not present

## 2021-04-14 DIAGNOSIS — R2689 Other abnormalities of gait and mobility: Secondary | ICD-10-CM | POA: Diagnosis not present

## 2021-04-14 DIAGNOSIS — M6281 Muscle weakness (generalized): Secondary | ICD-10-CM | POA: Diagnosis not present

## 2021-04-14 DIAGNOSIS — R296 Repeated falls: Secondary | ICD-10-CM | POA: Diagnosis not present

## 2021-04-19 DIAGNOSIS — I350 Nonrheumatic aortic (valve) stenosis: Secondary | ICD-10-CM | POA: Diagnosis not present

## 2021-04-19 DIAGNOSIS — R55 Syncope and collapse: Secondary | ICD-10-CM | POA: Diagnosis not present

## 2021-05-13 DIAGNOSIS — Z79899 Other long term (current) drug therapy: Secondary | ICD-10-CM | POA: Diagnosis not present

## 2021-05-13 DIAGNOSIS — Z952 Presence of prosthetic heart valve: Secondary | ICD-10-CM | POA: Diagnosis not present

## 2021-05-13 DIAGNOSIS — R42 Dizziness and giddiness: Secondary | ICD-10-CM | POA: Diagnosis not present

## 2021-05-13 DIAGNOSIS — Z20822 Contact with and (suspected) exposure to covid-19: Secondary | ICD-10-CM | POA: Diagnosis not present

## 2021-05-13 DIAGNOSIS — R531 Weakness: Secondary | ICD-10-CM | POA: Diagnosis not present

## 2021-05-16 DIAGNOSIS — R634 Abnormal weight loss: Secondary | ICD-10-CM | POA: Diagnosis not present

## 2021-05-16 DIAGNOSIS — Z6820 Body mass index (BMI) 20.0-20.9, adult: Secondary | ICD-10-CM | POA: Diagnosis not present

## 2021-05-16 DIAGNOSIS — I10 Essential (primary) hypertension: Secondary | ICD-10-CM | POA: Diagnosis not present

## 2021-05-16 DIAGNOSIS — R42 Dizziness and giddiness: Secondary | ICD-10-CM | POA: Diagnosis not present

## 2021-05-16 DIAGNOSIS — R262 Difficulty in walking, not elsewhere classified: Secondary | ICD-10-CM | POA: Diagnosis not present

## 2021-05-16 DIAGNOSIS — F09 Unspecified mental disorder due to known physiological condition: Secondary | ICD-10-CM | POA: Diagnosis not present

## 2021-06-02 DIAGNOSIS — M21372 Foot drop, left foot: Secondary | ICD-10-CM | POA: Diagnosis not present

## 2021-06-02 DIAGNOSIS — R29898 Other symptoms and signs involving the musculoskeletal system: Secondary | ICD-10-CM | POA: Diagnosis not present

## 2021-06-09 DIAGNOSIS — R2689 Other abnormalities of gait and mobility: Secondary | ICD-10-CM | POA: Diagnosis not present

## 2021-06-16 DIAGNOSIS — R2689 Other abnormalities of gait and mobility: Secondary | ICD-10-CM | POA: Diagnosis not present

## 2021-06-23 DIAGNOSIS — R2689 Other abnormalities of gait and mobility: Secondary | ICD-10-CM | POA: Diagnosis not present

## 2021-06-27 DIAGNOSIS — F09 Unspecified mental disorder due to known physiological condition: Secondary | ICD-10-CM | POA: Diagnosis not present

## 2021-06-27 DIAGNOSIS — Z6820 Body mass index (BMI) 20.0-20.9, adult: Secondary | ICD-10-CM | POA: Diagnosis not present

## 2021-06-27 DIAGNOSIS — F0390 Unspecified dementia without behavioral disturbance: Secondary | ICD-10-CM | POA: Diagnosis not present

## 2021-06-27 DIAGNOSIS — R350 Frequency of micturition: Secondary | ICD-10-CM | POA: Diagnosis not present

## 2021-06-27 DIAGNOSIS — R634 Abnormal weight loss: Secondary | ICD-10-CM | POA: Diagnosis not present

## 2021-06-27 DIAGNOSIS — N3281 Overactive bladder: Secondary | ICD-10-CM | POA: Diagnosis not present

## 2021-06-27 DIAGNOSIS — N401 Enlarged prostate with lower urinary tract symptoms: Secondary | ICD-10-CM | POA: Diagnosis not present

## 2021-06-27 DIAGNOSIS — R351 Nocturia: Secondary | ICD-10-CM | POA: Diagnosis not present

## 2021-07-07 DIAGNOSIS — R2689 Other abnormalities of gait and mobility: Secondary | ICD-10-CM | POA: Diagnosis not present

## 2021-07-15 DIAGNOSIS — R2689 Other abnormalities of gait and mobility: Secondary | ICD-10-CM | POA: Diagnosis not present

## 2021-07-22 DIAGNOSIS — R2689 Other abnormalities of gait and mobility: Secondary | ICD-10-CM | POA: Diagnosis not present

## 2021-07-26 DIAGNOSIS — N3281 Overactive bladder: Secondary | ICD-10-CM | POA: Diagnosis not present

## 2021-07-26 DIAGNOSIS — R351 Nocturia: Secondary | ICD-10-CM | POA: Diagnosis not present

## 2021-07-26 DIAGNOSIS — N401 Enlarged prostate with lower urinary tract symptoms: Secondary | ICD-10-CM | POA: Diagnosis not present

## 2021-07-26 DIAGNOSIS — F0390 Unspecified dementia without behavioral disturbance: Secondary | ICD-10-CM | POA: Diagnosis not present

## 2021-07-26 DIAGNOSIS — R350 Frequency of micturition: Secondary | ICD-10-CM | POA: Diagnosis not present

## 2021-08-29 DIAGNOSIS — I10 Essential (primary) hypertension: Secondary | ICD-10-CM | POA: Diagnosis not present

## 2021-08-29 DIAGNOSIS — J449 Chronic obstructive pulmonary disease, unspecified: Secondary | ICD-10-CM | POA: Diagnosis not present

## 2021-08-29 DIAGNOSIS — F09 Unspecified mental disorder due to known physiological condition: Secondary | ICD-10-CM | POA: Diagnosis not present

## 2021-08-29 DIAGNOSIS — R634 Abnormal weight loss: Secondary | ICD-10-CM | POA: Diagnosis not present

## 2021-09-29 DIAGNOSIS — N3281 Overactive bladder: Secondary | ICD-10-CM | POA: Diagnosis not present

## 2021-09-29 DIAGNOSIS — R351 Nocturia: Secondary | ICD-10-CM | POA: Diagnosis not present

## 2021-09-29 DIAGNOSIS — F0390 Unspecified dementia without behavioral disturbance: Secondary | ICD-10-CM | POA: Diagnosis not present

## 2021-09-29 DIAGNOSIS — N401 Enlarged prostate with lower urinary tract symptoms: Secondary | ICD-10-CM | POA: Diagnosis not present

## 2021-09-29 DIAGNOSIS — R350 Frequency of micturition: Secondary | ICD-10-CM | POA: Diagnosis not present

## 2021-11-01 DIAGNOSIS — R350 Frequency of micturition: Secondary | ICD-10-CM | POA: Diagnosis not present

## 2021-11-01 DIAGNOSIS — N401 Enlarged prostate with lower urinary tract symptoms: Secondary | ICD-10-CM | POA: Diagnosis not present

## 2021-11-01 DIAGNOSIS — F0390 Unspecified dementia without behavioral disturbance: Secondary | ICD-10-CM | POA: Diagnosis not present

## 2021-11-01 DIAGNOSIS — N3281 Overactive bladder: Secondary | ICD-10-CM | POA: Diagnosis not present

## 2021-11-01 DIAGNOSIS — R351 Nocturia: Secondary | ICD-10-CM | POA: Diagnosis not present

## 2021-12-19 DIAGNOSIS — N400 Enlarged prostate without lower urinary tract symptoms: Secondary | ICD-10-CM | POA: Diagnosis not present

## 2021-12-19 DIAGNOSIS — R42 Dizziness and giddiness: Secondary | ICD-10-CM | POA: Diagnosis not present

## 2021-12-19 DIAGNOSIS — R634 Abnormal weight loss: Secondary | ICD-10-CM | POA: Diagnosis not present

## 2021-12-19 DIAGNOSIS — E785 Hyperlipidemia, unspecified: Secondary | ICD-10-CM | POA: Diagnosis not present

## 2021-12-19 DIAGNOSIS — F09 Unspecified mental disorder due to known physiological condition: Secondary | ICD-10-CM | POA: Diagnosis not present

## 2021-12-19 DIAGNOSIS — R350 Frequency of micturition: Secondary | ICD-10-CM | POA: Diagnosis not present

## 2021-12-19 DIAGNOSIS — R262 Difficulty in walking, not elsewhere classified: Secondary | ICD-10-CM | POA: Diagnosis not present

## 2021-12-19 DIAGNOSIS — I10 Essential (primary) hypertension: Secondary | ICD-10-CM | POA: Diagnosis not present

## 2021-12-19 DIAGNOSIS — E538 Deficiency of other specified B group vitamins: Secondary | ICD-10-CM | POA: Diagnosis not present

## 2021-12-19 DIAGNOSIS — N401 Enlarged prostate with lower urinary tract symptoms: Secondary | ICD-10-CM | POA: Diagnosis not present

## 2022-01-02 DIAGNOSIS — S80812A Abrasion, left lower leg, initial encounter: Secondary | ICD-10-CM | POA: Diagnosis not present

## 2022-01-02 DIAGNOSIS — W19XXXA Unspecified fall, initial encounter: Secondary | ICD-10-CM | POA: Diagnosis not present

## 2022-01-24 DIAGNOSIS — F0390 Unspecified dementia without behavioral disturbance: Secondary | ICD-10-CM | POA: Diagnosis not present

## 2022-01-24 DIAGNOSIS — N3281 Overactive bladder: Secondary | ICD-10-CM | POA: Diagnosis not present

## 2022-01-24 DIAGNOSIS — N401 Enlarged prostate with lower urinary tract symptoms: Secondary | ICD-10-CM | POA: Diagnosis not present

## 2022-01-24 DIAGNOSIS — R350 Frequency of micturition: Secondary | ICD-10-CM | POA: Diagnosis not present

## 2022-01-24 DIAGNOSIS — R351 Nocturia: Secondary | ICD-10-CM | POA: Diagnosis not present

## 2022-03-14 DIAGNOSIS — N3281 Overactive bladder: Secondary | ICD-10-CM | POA: Diagnosis not present

## 2022-03-14 DIAGNOSIS — R351 Nocturia: Secondary | ICD-10-CM | POA: Diagnosis not present

## 2022-03-14 DIAGNOSIS — R350 Frequency of micturition: Secondary | ICD-10-CM | POA: Diagnosis not present

## 2022-03-14 DIAGNOSIS — N401 Enlarged prostate with lower urinary tract symptoms: Secondary | ICD-10-CM | POA: Diagnosis not present

## 2022-03-14 DIAGNOSIS — F0390 Unspecified dementia without behavioral disturbance: Secondary | ICD-10-CM | POA: Diagnosis not present

## 2022-03-17 ENCOUNTER — Ambulatory Visit (INDEPENDENT_AMBULATORY_CARE_PROVIDER_SITE_OTHER): Payer: Self-pay | Admitting: Podiatry

## 2022-03-17 DIAGNOSIS — Z91199 Patient's noncompliance with other medical treatment and regimen due to unspecified reason: Secondary | ICD-10-CM

## 2022-03-17 NOTE — Progress Notes (Signed)
Pt was a no show for appointment, no charge

## 2022-04-28 DIAGNOSIS — D487 Neoplasm of uncertain behavior of other specified sites: Secondary | ICD-10-CM | POA: Diagnosis not present

## 2022-05-15 DIAGNOSIS — Z961 Presence of intraocular lens: Secondary | ICD-10-CM | POA: Diagnosis not present

## 2022-05-15 DIAGNOSIS — H5202 Hypermetropia, left eye: Secondary | ICD-10-CM | POA: Diagnosis not present

## 2022-05-15 DIAGNOSIS — H52202 Unspecified astigmatism, left eye: Secondary | ICD-10-CM | POA: Diagnosis not present

## 2022-05-15 DIAGNOSIS — H16223 Keratoconjunctivitis sicca, not specified as Sjogren's, bilateral: Secondary | ICD-10-CM | POA: Diagnosis not present

## 2022-05-15 DIAGNOSIS — H524 Presbyopia: Secondary | ICD-10-CM | POA: Diagnosis not present

## 2022-05-15 DIAGNOSIS — H44521 Atrophy of globe, right eye: Secondary | ICD-10-CM | POA: Diagnosis not present

## 2022-05-15 DIAGNOSIS — H43392 Other vitreous opacities, left eye: Secondary | ICD-10-CM | POA: Diagnosis not present

## 2022-05-17 DIAGNOSIS — R001 Bradycardia, unspecified: Secondary | ICD-10-CM | POA: Diagnosis not present

## 2022-05-17 DIAGNOSIS — I6782 Cerebral ischemia: Secondary | ICD-10-CM | POA: Diagnosis not present

## 2022-05-17 DIAGNOSIS — I959 Hypotension, unspecified: Secondary | ICD-10-CM | POA: Diagnosis not present

## 2022-05-17 DIAGNOSIS — Z87891 Personal history of nicotine dependence: Secondary | ICD-10-CM | POA: Diagnosis not present

## 2022-05-17 DIAGNOSIS — I491 Atrial premature depolarization: Secondary | ICD-10-CM | POA: Diagnosis not present

## 2022-05-17 DIAGNOSIS — I444 Left anterior fascicular block: Secondary | ICD-10-CM | POA: Diagnosis not present

## 2022-05-17 DIAGNOSIS — R42 Dizziness and giddiness: Secondary | ICD-10-CM | POA: Diagnosis not present

## 2022-05-17 DIAGNOSIS — R55 Syncope and collapse: Secondary | ICD-10-CM | POA: Diagnosis not present

## 2022-05-17 DIAGNOSIS — Z20822 Contact with and (suspected) exposure to covid-19: Secondary | ICD-10-CM | POA: Diagnosis not present

## 2022-06-01 DIAGNOSIS — L821 Other seborrheic keratosis: Secondary | ICD-10-CM | POA: Diagnosis not present

## 2022-06-01 DIAGNOSIS — L578 Other skin changes due to chronic exposure to nonionizing radiation: Secondary | ICD-10-CM | POA: Diagnosis not present

## 2022-06-01 DIAGNOSIS — C44519 Basal cell carcinoma of skin of other part of trunk: Secondary | ICD-10-CM | POA: Diagnosis not present

## 2022-06-01 DIAGNOSIS — L570 Actinic keratosis: Secondary | ICD-10-CM | POA: Diagnosis not present

## 2022-06-01 DIAGNOSIS — D044 Carcinoma in situ of skin of scalp and neck: Secondary | ICD-10-CM | POA: Diagnosis not present

## 2022-06-15 DIAGNOSIS — N401 Enlarged prostate with lower urinary tract symptoms: Secondary | ICD-10-CM | POA: Diagnosis not present

## 2022-06-15 DIAGNOSIS — R351 Nocturia: Secondary | ICD-10-CM | POA: Diagnosis not present

## 2022-06-15 DIAGNOSIS — F0390 Unspecified dementia without behavioral disturbance: Secondary | ICD-10-CM | POA: Diagnosis not present

## 2022-06-15 DIAGNOSIS — R350 Frequency of micturition: Secondary | ICD-10-CM | POA: Diagnosis not present

## 2022-06-15 DIAGNOSIS — N3281 Overactive bladder: Secondary | ICD-10-CM | POA: Diagnosis not present

## 2022-06-27 DIAGNOSIS — R350 Frequency of micturition: Secondary | ICD-10-CM | POA: Diagnosis not present

## 2022-06-27 DIAGNOSIS — I10 Essential (primary) hypertension: Secondary | ICD-10-CM | POA: Diagnosis not present

## 2022-06-27 DIAGNOSIS — F09 Unspecified mental disorder due to known physiological condition: Secondary | ICD-10-CM | POA: Diagnosis not present

## 2022-06-27 DIAGNOSIS — R634 Abnormal weight loss: Secondary | ICD-10-CM | POA: Diagnosis not present

## 2022-06-27 DIAGNOSIS — E785 Hyperlipidemia, unspecified: Secondary | ICD-10-CM | POA: Diagnosis not present

## 2022-06-27 DIAGNOSIS — Z9181 History of falling: Secondary | ICD-10-CM | POA: Diagnosis not present

## 2022-06-27 DIAGNOSIS — Z139 Encounter for screening, unspecified: Secondary | ICD-10-CM | POA: Diagnosis not present

## 2022-06-27 DIAGNOSIS — Z1331 Encounter for screening for depression: Secondary | ICD-10-CM | POA: Diagnosis not present

## 2022-06-27 DIAGNOSIS — N401 Enlarged prostate with lower urinary tract symptoms: Secondary | ICD-10-CM | POA: Diagnosis not present

## 2022-07-13 DIAGNOSIS — I350 Nonrheumatic aortic (valve) stenosis: Secondary | ICD-10-CM | POA: Diagnosis not present

## 2022-07-13 DIAGNOSIS — I493 Ventricular premature depolarization: Secondary | ICD-10-CM | POA: Diagnosis not present

## 2022-07-13 DIAGNOSIS — R55 Syncope and collapse: Secondary | ICD-10-CM | POA: Diagnosis not present

## 2022-07-13 DIAGNOSIS — Z952 Presence of prosthetic heart valve: Secondary | ICD-10-CM | POA: Diagnosis not present

## 2022-07-13 DIAGNOSIS — E7800 Pure hypercholesterolemia, unspecified: Secondary | ICD-10-CM | POA: Diagnosis not present

## 2022-07-13 DIAGNOSIS — R600 Localized edema: Secondary | ICD-10-CM | POA: Diagnosis not present

## 2022-07-13 DIAGNOSIS — I10 Essential (primary) hypertension: Secondary | ICD-10-CM | POA: Diagnosis not present

## 2022-07-13 DIAGNOSIS — I454 Nonspecific intraventricular block: Secondary | ICD-10-CM | POA: Diagnosis not present

## 2022-07-13 DIAGNOSIS — R0602 Shortness of breath: Secondary | ICD-10-CM | POA: Diagnosis not present

## 2022-07-13 DIAGNOSIS — K922 Gastrointestinal hemorrhage, unspecified: Secondary | ICD-10-CM | POA: Diagnosis not present

## 2022-08-09 DIAGNOSIS — R55 Syncope and collapse: Secondary | ICD-10-CM | POA: Diagnosis not present

## 2022-08-14 DIAGNOSIS — I493 Ventricular premature depolarization: Secondary | ICD-10-CM | POA: Diagnosis not present

## 2022-08-14 DIAGNOSIS — I472 Ventricular tachycardia, unspecified: Secondary | ICD-10-CM | POA: Diagnosis not present

## 2022-08-14 DIAGNOSIS — I4891 Unspecified atrial fibrillation: Secondary | ICD-10-CM | POA: Diagnosis not present

## 2022-09-19 DIAGNOSIS — I340 Nonrheumatic mitral (valve) insufficiency: Secondary | ICD-10-CM | POA: Diagnosis not present

## 2022-10-27 DIAGNOSIS — R634 Abnormal weight loss: Secondary | ICD-10-CM | POA: Diagnosis not present

## 2022-10-27 DIAGNOSIS — N401 Enlarged prostate with lower urinary tract symptoms: Secondary | ICD-10-CM | POA: Diagnosis not present

## 2022-10-27 DIAGNOSIS — I10 Essential (primary) hypertension: Secondary | ICD-10-CM | POA: Diagnosis not present

## 2022-10-27 DIAGNOSIS — E785 Hyperlipidemia, unspecified: Secondary | ICD-10-CM | POA: Diagnosis not present

## 2022-10-27 DIAGNOSIS — R600 Localized edema: Secondary | ICD-10-CM | POA: Diagnosis not present

## 2022-10-27 DIAGNOSIS — F09 Unspecified mental disorder due to known physiological condition: Secondary | ICD-10-CM | POA: Diagnosis not present

## 2022-10-27 DIAGNOSIS — I350 Nonrheumatic aortic (valve) stenosis: Secondary | ICD-10-CM | POA: Diagnosis not present

## 2022-11-16 DIAGNOSIS — I350 Nonrheumatic aortic (valve) stenosis: Secondary | ICD-10-CM | POA: Diagnosis not present

## 2023-01-16 DIAGNOSIS — R350 Frequency of micturition: Secondary | ICD-10-CM | POA: Diagnosis not present

## 2023-01-16 DIAGNOSIS — N401 Enlarged prostate with lower urinary tract symptoms: Secondary | ICD-10-CM | POA: Diagnosis not present

## 2023-01-16 DIAGNOSIS — R972 Elevated prostate specific antigen [PSA]: Secondary | ICD-10-CM | POA: Diagnosis not present

## 2023-01-16 DIAGNOSIS — R351 Nocturia: Secondary | ICD-10-CM | POA: Diagnosis not present

## 2023-01-16 DIAGNOSIS — N3281 Overactive bladder: Secondary | ICD-10-CM | POA: Diagnosis not present

## 2023-02-22 DIAGNOSIS — R55 Syncope and collapse: Secondary | ICD-10-CM | POA: Diagnosis not present

## 2023-02-22 DIAGNOSIS — I350 Nonrheumatic aortic (valve) stenosis: Secondary | ICD-10-CM | POA: Diagnosis not present

## 2023-02-27 DIAGNOSIS — R262 Difficulty in walking, not elsewhere classified: Secondary | ICD-10-CM | POA: Diagnosis not present

## 2023-02-27 DIAGNOSIS — F09 Unspecified mental disorder due to known physiological condition: Secondary | ICD-10-CM | POA: Diagnosis not present

## 2023-02-27 DIAGNOSIS — N401 Enlarged prostate with lower urinary tract symptoms: Secondary | ICD-10-CM | POA: Diagnosis not present

## 2023-02-27 DIAGNOSIS — R350 Frequency of micturition: Secondary | ICD-10-CM | POA: Diagnosis not present

## 2023-02-27 DIAGNOSIS — E785 Hyperlipidemia, unspecified: Secondary | ICD-10-CM | POA: Diagnosis not present

## 2023-02-27 DIAGNOSIS — I10 Essential (primary) hypertension: Secondary | ICD-10-CM | POA: Diagnosis not present

## 2023-02-27 DIAGNOSIS — I350 Nonrheumatic aortic (valve) stenosis: Secondary | ICD-10-CM | POA: Diagnosis not present

## 2023-02-27 DIAGNOSIS — R634 Abnormal weight loss: Secondary | ICD-10-CM | POA: Diagnosis not present

## 2023-02-27 DIAGNOSIS — R600 Localized edema: Secondary | ICD-10-CM | POA: Diagnosis not present

## 2023-02-27 DIAGNOSIS — Z23 Encounter for immunization: Secondary | ICD-10-CM | POA: Diagnosis not present

## 2023-03-22 DIAGNOSIS — I444 Left anterior fascicular block: Secondary | ICD-10-CM | POA: Diagnosis not present

## 2023-03-22 DIAGNOSIS — R002 Palpitations: Secondary | ICD-10-CM | POA: Diagnosis not present

## 2023-03-22 DIAGNOSIS — R001 Bradycardia, unspecified: Secondary | ICD-10-CM | POA: Diagnosis not present

## 2023-03-30 DIAGNOSIS — R002 Palpitations: Secondary | ICD-10-CM | POA: Diagnosis not present

## 2023-04-11 DIAGNOSIS — I501 Left ventricular failure: Secondary | ICD-10-CM | POA: Diagnosis not present

## 2023-04-11 DIAGNOSIS — I2119 ST elevation (STEMI) myocardial infarction involving other coronary artery of inferior wall: Secondary | ICD-10-CM | POA: Diagnosis not present

## 2023-04-16 DIAGNOSIS — R002 Palpitations: Secondary | ICD-10-CM | POA: Diagnosis not present

## 2023-04-26 DIAGNOSIS — I493 Ventricular premature depolarization: Secondary | ICD-10-CM | POA: Diagnosis not present

## 2023-04-26 DIAGNOSIS — I472 Ventricular tachycardia, unspecified: Secondary | ICD-10-CM | POA: Diagnosis not present

## 2023-04-26 DIAGNOSIS — I4719 Other supraventricular tachycardia: Secondary | ICD-10-CM | POA: Diagnosis not present

## 2023-05-18 ENCOUNTER — Other Ambulatory Visit (HOSPITAL_COMMUNITY): Payer: Self-pay

## 2023-05-18 MED ORDER — ELIQUIS 5 MG PO TABS
5.0000 mg | ORAL_TABLET | Freq: Two times a day (BID) | ORAL | 6 refills | Status: DC
  Filled 2023-05-18: qty 60, 30d supply, fill #0

## 2023-05-25 ENCOUNTER — Other Ambulatory Visit (HOSPITAL_COMMUNITY): Payer: Self-pay

## 2023-05-25 ENCOUNTER — Other Ambulatory Visit: Payer: Self-pay

## 2023-05-30 ENCOUNTER — Other Ambulatory Visit (HOSPITAL_COMMUNITY): Payer: Self-pay

## 2023-06-28 ENCOUNTER — Other Ambulatory Visit (HOSPITAL_COMMUNITY): Payer: Self-pay

## 2023-06-29 ENCOUNTER — Other Ambulatory Visit (HOSPITAL_COMMUNITY): Payer: Self-pay

## 2023-06-29 MED ORDER — ELIQUIS 5 MG PO TABS
5.0000 mg | ORAL_TABLET | Freq: Two times a day (BID) | ORAL | 4 refills | Status: AC
  Filled 2023-06-29 – 2023-07-24 (×2): qty 60, 30d supply, fill #0
  Filled 2023-08-31: qty 60, 30d supply, fill #1
  Filled ????-??-??: fill #0

## 2023-07-02 DIAGNOSIS — R262 Difficulty in walking, not elsewhere classified: Secondary | ICD-10-CM | POA: Diagnosis not present

## 2023-07-02 DIAGNOSIS — Z6820 Body mass index (BMI) 20.0-20.9, adult: Secondary | ICD-10-CM | POA: Diagnosis not present

## 2023-07-02 DIAGNOSIS — F09 Unspecified mental disorder due to known physiological condition: Secondary | ICD-10-CM | POA: Diagnosis not present

## 2023-07-02 DIAGNOSIS — R634 Abnormal weight loss: Secondary | ICD-10-CM | POA: Diagnosis not present

## 2023-07-02 DIAGNOSIS — J449 Chronic obstructive pulmonary disease, unspecified: Secondary | ICD-10-CM | POA: Diagnosis not present

## 2023-07-02 DIAGNOSIS — I499 Cardiac arrhythmia, unspecified: Secondary | ICD-10-CM | POA: Diagnosis not present

## 2023-07-02 DIAGNOSIS — R350 Frequency of micturition: Secondary | ICD-10-CM | POA: Diagnosis not present

## 2023-07-02 DIAGNOSIS — I10 Essential (primary) hypertension: Secondary | ICD-10-CM | POA: Diagnosis not present

## 2023-07-02 DIAGNOSIS — E785 Hyperlipidemia, unspecified: Secondary | ICD-10-CM | POA: Diagnosis not present

## 2023-07-02 DIAGNOSIS — N401 Enlarged prostate with lower urinary tract symptoms: Secondary | ICD-10-CM | POA: Diagnosis not present

## 2023-07-11 DIAGNOSIS — J918 Pleural effusion in other conditions classified elsewhere: Secondary | ICD-10-CM | POA: Diagnosis not present

## 2023-07-11 DIAGNOSIS — I517 Cardiomegaly: Secondary | ICD-10-CM | POA: Diagnosis not present

## 2023-07-11 DIAGNOSIS — I340 Nonrheumatic mitral (valve) insufficiency: Secondary | ICD-10-CM | POA: Diagnosis not present

## 2023-07-11 DIAGNOSIS — Z952 Presence of prosthetic heart valve: Secondary | ICD-10-CM | POA: Diagnosis not present

## 2023-07-17 DIAGNOSIS — R972 Elevated prostate specific antigen [PSA]: Secondary | ICD-10-CM | POA: Diagnosis not present

## 2023-07-17 DIAGNOSIS — R350 Frequency of micturition: Secondary | ICD-10-CM | POA: Diagnosis not present

## 2023-07-17 DIAGNOSIS — F0390 Unspecified dementia without behavioral disturbance: Secondary | ICD-10-CM | POA: Diagnosis not present

## 2023-07-17 DIAGNOSIS — N401 Enlarged prostate with lower urinary tract symptoms: Secondary | ICD-10-CM | POA: Diagnosis not present

## 2023-07-17 DIAGNOSIS — N3281 Overactive bladder: Secondary | ICD-10-CM | POA: Diagnosis not present

## 2023-07-19 ENCOUNTER — Other Ambulatory Visit (HOSPITAL_COMMUNITY): Payer: Self-pay

## 2023-07-20 ENCOUNTER — Other Ambulatory Visit: Payer: Self-pay

## 2023-07-20 ENCOUNTER — Other Ambulatory Visit (HOSPITAL_COMMUNITY): Payer: Self-pay

## 2023-07-20 MED ORDER — CARVEDILOL 12.5 MG PO TABS
ORAL_TABLET | ORAL | 1 refills | Status: AC
  Filled 2023-07-20: qty 135, 90d supply, fill #0
  Filled 2023-11-01 (×2): qty 135, 90d supply, fill #1

## 2023-07-20 MED ORDER — MYRBETRIQ 50 MG PO TB24
50.0000 mg | ORAL_TABLET | Freq: Every day | ORAL | 1 refills | Status: AC
  Filled 2023-07-20 – 2023-07-24 (×2): qty 90, 90d supply, fill #0
  Filled 2023-11-30: qty 90, 90d supply, fill #1

## 2023-07-20 MED ORDER — ELIQUIS 5 MG PO TABS
5.0000 mg | ORAL_TABLET | Freq: Two times a day (BID) | ORAL | 6 refills | Status: DC
  Filled 2023-07-20 – 2023-07-24 (×2): qty 60, 30d supply, fill #0

## 2023-07-24 ENCOUNTER — Other Ambulatory Visit: Payer: Self-pay

## 2023-07-24 ENCOUNTER — Other Ambulatory Visit (HOSPITAL_COMMUNITY): Payer: Self-pay

## 2023-08-10 ENCOUNTER — Other Ambulatory Visit: Payer: Self-pay

## 2023-08-10 ENCOUNTER — Other Ambulatory Visit (HOSPITAL_COMMUNITY): Payer: Self-pay

## 2023-08-10 MED ORDER — AMLODIPINE BESY-BENAZEPRIL HCL 10-20 MG PO CAPS
1.0000 | ORAL_CAPSULE | Freq: Every day | ORAL | 4 refills | Status: AC
  Filled 2023-08-10: qty 90, 90d supply, fill #0
  Filled 2023-11-22: qty 90, 90d supply, fill #1

## 2023-08-13 ENCOUNTER — Other Ambulatory Visit (HOSPITAL_COMMUNITY): Payer: Self-pay

## 2023-08-13 MED ORDER — FINASTERIDE 5 MG PO TABS
5.0000 mg | ORAL_TABLET | Freq: Every evening | ORAL | 1 refills | Status: AC
  Filled 2023-08-13: qty 90, 90d supply, fill #0
  Filled 2024-01-09 – 2024-01-10 (×2): qty 90, 90d supply, fill #1

## 2023-08-31 ENCOUNTER — Other Ambulatory Visit (HOSPITAL_COMMUNITY): Payer: Self-pay

## 2023-09-01 ENCOUNTER — Other Ambulatory Visit (HOSPITAL_COMMUNITY): Payer: Self-pay

## 2023-09-01 MED ORDER — PANTOPRAZOLE SODIUM 40 MG PO TBEC
40.0000 mg | DELAYED_RELEASE_TABLET | Freq: Two times a day (BID) | ORAL | 3 refills | Status: AC
  Filled 2023-09-01 – 2023-09-03 (×3): qty 180, 90d supply, fill #0
  Filled 2024-01-24 – 2024-01-31 (×3): qty 180, 90d supply, fill #1

## 2023-09-01 MED ORDER — PRAVASTATIN SODIUM 10 MG PO TABS
10.0000 mg | ORAL_TABLET | Freq: Every evening | ORAL | 3 refills | Status: AC
  Filled 2023-09-01 – 2023-10-08 (×2): qty 90, 90d supply, fill #0

## 2023-09-01 MED ORDER — DONEPEZIL HCL 10 MG PO TABS
10.0000 mg | ORAL_TABLET | Freq: Every day | ORAL | 3 refills | Status: AC
  Filled 2023-09-01 – 2023-09-03 (×2): qty 90, 90d supply, fill #0
  Filled 2024-01-09 – 2024-01-10 (×2): qty 90, 90d supply, fill #1

## 2023-09-03 ENCOUNTER — Other Ambulatory Visit (HOSPITAL_COMMUNITY): Payer: Self-pay

## 2023-09-04 ENCOUNTER — Other Ambulatory Visit: Payer: Self-pay

## 2023-09-20 ENCOUNTER — Other Ambulatory Visit (HOSPITAL_COMMUNITY): Payer: Self-pay

## 2023-09-20 MED ORDER — TAMSULOSIN HCL 0.4 MG PO CAPS
0.4000 mg | ORAL_CAPSULE | Freq: Every day | ORAL | 1 refills | Status: AC
  Filled 2023-09-20 (×2): qty 90, 90d supply, fill #0

## 2023-10-08 ENCOUNTER — Other Ambulatory Visit (HOSPITAL_COMMUNITY): Payer: Self-pay

## 2023-10-08 ENCOUNTER — Other Ambulatory Visit: Payer: Self-pay

## 2023-10-11 DIAGNOSIS — J9811 Atelectasis: Secondary | ICD-10-CM | POA: Diagnosis not present

## 2023-10-11 DIAGNOSIS — R0602 Shortness of breath: Secondary | ICD-10-CM | POA: Diagnosis not present

## 2023-10-11 DIAGNOSIS — J90 Pleural effusion, not elsewhere classified: Secondary | ICD-10-CM | POA: Diagnosis not present

## 2023-10-12 DIAGNOSIS — I444 Left anterior fascicular block: Secondary | ICD-10-CM | POA: Diagnosis not present

## 2023-10-12 DIAGNOSIS — I493 Ventricular premature depolarization: Secondary | ICD-10-CM | POA: Diagnosis not present

## 2023-10-12 DIAGNOSIS — I454 Nonspecific intraventricular block: Secondary | ICD-10-CM | POA: Diagnosis not present

## 2023-10-19 DIAGNOSIS — R5381 Other malaise: Secondary | ICD-10-CM | POA: Diagnosis not present

## 2023-10-19 DIAGNOSIS — E538 Deficiency of other specified B group vitamins: Secondary | ICD-10-CM | POA: Diagnosis not present

## 2023-10-19 DIAGNOSIS — F320 Major depressive disorder, single episode, mild: Secondary | ICD-10-CM | POA: Diagnosis not present

## 2023-10-19 DIAGNOSIS — R5383 Other fatigue: Secondary | ICD-10-CM | POA: Diagnosis not present

## 2023-10-19 DIAGNOSIS — Z6820 Body mass index (BMI) 20.0-20.9, adult: Secondary | ICD-10-CM | POA: Diagnosis not present

## 2023-10-22 DIAGNOSIS — R0602 Shortness of breath: Secondary | ICD-10-CM | POA: Diagnosis not present

## 2023-10-22 DIAGNOSIS — Z79899 Other long term (current) drug therapy: Secondary | ICD-10-CM | POA: Diagnosis not present

## 2023-10-23 ENCOUNTER — Other Ambulatory Visit: Payer: Self-pay

## 2023-10-23 ENCOUNTER — Other Ambulatory Visit (HOSPITAL_COMMUNITY): Payer: Self-pay

## 2023-10-23 MED ORDER — DEXAMETHASONE SODIUM PHOSPHATE 0.1 % OP SOLN
1.0000 [drp] | Freq: Two times a day (BID) | OPHTHALMIC | 3 refills | Status: AC
  Filled 2023-10-23: qty 5, 50d supply, fill #0
  Filled 2023-11-20: qty 10, 30d supply, fill #0
  Filled 2023-11-21: qty 10, 100d supply, fill #0

## 2023-10-24 ENCOUNTER — Other Ambulatory Visit (HOSPITAL_COMMUNITY): Payer: Self-pay

## 2023-10-29 ENCOUNTER — Other Ambulatory Visit (HOSPITAL_COMMUNITY): Payer: Self-pay

## 2023-10-30 DIAGNOSIS — R262 Difficulty in walking, not elsewhere classified: Secondary | ICD-10-CM | POA: Diagnosis not present

## 2023-10-30 DIAGNOSIS — I10 Essential (primary) hypertension: Secondary | ICD-10-CM | POA: Diagnosis not present

## 2023-10-30 DIAGNOSIS — F320 Major depressive disorder, single episode, mild: Secondary | ICD-10-CM | POA: Diagnosis not present

## 2023-10-30 DIAGNOSIS — F09 Unspecified mental disorder due to known physiological condition: Secondary | ICD-10-CM | POA: Diagnosis not present

## 2023-10-30 DIAGNOSIS — I499 Cardiac arrhythmia, unspecified: Secondary | ICD-10-CM | POA: Diagnosis not present

## 2023-10-30 DIAGNOSIS — R634 Abnormal weight loss: Secondary | ICD-10-CM | POA: Diagnosis not present

## 2023-10-30 DIAGNOSIS — R350 Frequency of micturition: Secondary | ICD-10-CM | POA: Diagnosis not present

## 2023-10-30 DIAGNOSIS — I350 Nonrheumatic aortic (valve) stenosis: Secondary | ICD-10-CM | POA: Diagnosis not present

## 2023-10-30 DIAGNOSIS — J449 Chronic obstructive pulmonary disease, unspecified: Secondary | ICD-10-CM | POA: Diagnosis not present

## 2023-10-30 DIAGNOSIS — E785 Hyperlipidemia, unspecified: Secondary | ICD-10-CM | POA: Diagnosis not present

## 2023-10-30 DIAGNOSIS — R600 Localized edema: Secondary | ICD-10-CM | POA: Diagnosis not present

## 2023-10-30 DIAGNOSIS — N401 Enlarged prostate with lower urinary tract symptoms: Secondary | ICD-10-CM | POA: Diagnosis not present

## 2023-11-01 ENCOUNTER — Other Ambulatory Visit (HOSPITAL_COMMUNITY): Payer: Self-pay

## 2023-11-05 ENCOUNTER — Other Ambulatory Visit: Payer: Self-pay | Admitting: Specialist

## 2023-11-05 DIAGNOSIS — R1310 Dysphagia, unspecified: Secondary | ICD-10-CM | POA: Diagnosis not present

## 2023-11-05 DIAGNOSIS — F0390 Unspecified dementia without behavioral disturbance: Secondary | ICD-10-CM | POA: Diagnosis not present

## 2023-11-05 DIAGNOSIS — Z7901 Long term (current) use of anticoagulants: Secondary | ICD-10-CM | POA: Diagnosis not present

## 2023-11-05 DIAGNOSIS — R634 Abnormal weight loss: Secondary | ICD-10-CM | POA: Diagnosis not present

## 2023-11-05 DIAGNOSIS — R630 Anorexia: Secondary | ICD-10-CM | POA: Diagnosis not present

## 2023-11-05 DIAGNOSIS — Z8501 Personal history of malignant neoplasm of esophagus: Secondary | ICD-10-CM | POA: Diagnosis not present

## 2023-11-07 ENCOUNTER — Other Ambulatory Visit (HOSPITAL_COMMUNITY): Payer: Self-pay

## 2023-11-13 DIAGNOSIS — N401 Enlarged prostate with lower urinary tract symptoms: Secondary | ICD-10-CM | POA: Diagnosis not present

## 2023-11-13 DIAGNOSIS — N3281 Overactive bladder: Secondary | ICD-10-CM | POA: Diagnosis not present

## 2023-11-13 DIAGNOSIS — R972 Elevated prostate specific antigen [PSA]: Secondary | ICD-10-CM | POA: Diagnosis not present

## 2023-11-13 DIAGNOSIS — R350 Frequency of micturition: Secondary | ICD-10-CM | POA: Diagnosis not present

## 2023-11-14 DIAGNOSIS — H52202 Unspecified astigmatism, left eye: Secondary | ICD-10-CM | POA: Diagnosis not present

## 2023-11-14 DIAGNOSIS — H43392 Other vitreous opacities, left eye: Secondary | ICD-10-CM | POA: Diagnosis not present

## 2023-11-14 DIAGNOSIS — Z961 Presence of intraocular lens: Secondary | ICD-10-CM | POA: Diagnosis not present

## 2023-11-14 DIAGNOSIS — H5202 Hypermetropia, left eye: Secondary | ICD-10-CM | POA: Diagnosis not present

## 2023-11-14 DIAGNOSIS — H16223 Keratoconjunctivitis sicca, not specified as Sjogren's, bilateral: Secondary | ICD-10-CM | POA: Diagnosis not present

## 2023-11-14 DIAGNOSIS — H524 Presbyopia: Secondary | ICD-10-CM | POA: Diagnosis not present

## 2023-11-14 DIAGNOSIS — H44521 Atrophy of globe, right eye: Secondary | ICD-10-CM | POA: Diagnosis not present

## 2023-11-14 DIAGNOSIS — H02834 Dermatochalasis of left upper eyelid: Secondary | ICD-10-CM | POA: Diagnosis not present

## 2023-11-14 DIAGNOSIS — H02831 Dermatochalasis of right upper eyelid: Secondary | ICD-10-CM | POA: Diagnosis not present

## 2023-11-20 ENCOUNTER — Other Ambulatory Visit (HOSPITAL_COMMUNITY): Payer: Self-pay

## 2023-11-21 ENCOUNTER — Other Ambulatory Visit (HOSPITAL_COMMUNITY): Payer: Self-pay

## 2023-11-22 ENCOUNTER — Ambulatory Visit
Admission: RE | Admit: 2023-11-22 | Discharge: 2023-11-22 | Disposition: A | Discharge status: Home / Self Care | Source: Ambulatory Visit | Attending: Specialist | Admitting: Specialist

## 2023-11-22 ENCOUNTER — Other Ambulatory Visit (HOSPITAL_COMMUNITY): Payer: Self-pay

## 2023-11-22 ENCOUNTER — Other Ambulatory Visit: Payer: Self-pay | Admitting: Specialist

## 2023-11-22 DIAGNOSIS — R634 Abnormal weight loss: Secondary | ICD-10-CM

## 2023-11-22 DIAGNOSIS — R1310 Dysphagia, unspecified: Secondary | ICD-10-CM

## 2023-11-23 ENCOUNTER — Other Ambulatory Visit (HOSPITAL_COMMUNITY): Payer: Self-pay

## 2023-11-30 ENCOUNTER — Other Ambulatory Visit (HOSPITAL_COMMUNITY): Payer: Self-pay

## 2023-12-01 ENCOUNTER — Other Ambulatory Visit (HOSPITAL_COMMUNITY): Payer: Self-pay

## 2023-12-04 DIAGNOSIS — E43 Unspecified severe protein-calorie malnutrition: Secondary | ICD-10-CM | POA: Diagnosis not present

## 2023-12-04 DIAGNOSIS — R41841 Cognitive communication deficit: Secondary | ICD-10-CM | POA: Diagnosis not present

## 2023-12-04 DIAGNOSIS — R062 Wheezing: Secondary | ICD-10-CM | POA: Diagnosis not present

## 2023-12-04 DIAGNOSIS — Z8501 Personal history of malignant neoplasm of esophagus: Secondary | ICD-10-CM | POA: Diagnosis not present

## 2023-12-04 DIAGNOSIS — I110 Hypertensive heart disease with heart failure: Secondary | ICD-10-CM | POA: Diagnosis not present

## 2023-12-04 DIAGNOSIS — I5022 Chronic systolic (congestive) heart failure: Secondary | ICD-10-CM | POA: Diagnosis not present

## 2023-12-04 DIAGNOSIS — I4891 Unspecified atrial fibrillation: Secondary | ICD-10-CM | POA: Diagnosis not present

## 2023-12-04 DIAGNOSIS — G9341 Metabolic encephalopathy: Secondary | ICD-10-CM | POA: Diagnosis not present

## 2023-12-04 DIAGNOSIS — J918 Pleural effusion in other conditions classified elsewhere: Secondary | ICD-10-CM | POA: Diagnosis not present

## 2023-12-04 DIAGNOSIS — M6259 Muscle wasting and atrophy, not elsewhere classified, multiple sites: Secondary | ICD-10-CM | POA: Diagnosis not present

## 2023-12-04 DIAGNOSIS — I454 Nonspecific intraventricular block: Secondary | ICD-10-CM | POA: Diagnosis not present

## 2023-12-04 DIAGNOSIS — M1990 Unspecified osteoarthritis, unspecified site: Secondary | ICD-10-CM | POA: Diagnosis not present

## 2023-12-04 DIAGNOSIS — R1312 Dysphagia, oropharyngeal phase: Secondary | ICD-10-CM | POA: Diagnosis not present

## 2023-12-04 DIAGNOSIS — N179 Acute kidney failure, unspecified: Secondary | ICD-10-CM | POA: Diagnosis not present

## 2023-12-04 DIAGNOSIS — R630 Anorexia: Secondary | ICD-10-CM | POA: Diagnosis not present

## 2023-12-04 DIAGNOSIS — E7800 Pure hypercholesterolemia, unspecified: Secondary | ICD-10-CM | POA: Diagnosis not present

## 2023-12-04 DIAGNOSIS — R634 Abnormal weight loss: Secondary | ICD-10-CM | POA: Diagnosis not present

## 2023-12-04 DIAGNOSIS — R262 Difficulty in walking, not elsewhere classified: Secondary | ICD-10-CM | POA: Diagnosis not present

## 2023-12-04 DIAGNOSIS — J449 Chronic obstructive pulmonary disease, unspecified: Secondary | ICD-10-CM | POA: Diagnosis not present

## 2023-12-04 DIAGNOSIS — Z681 Body mass index (BMI) 19 or less, adult: Secondary | ICD-10-CM | POA: Diagnosis not present

## 2023-12-04 DIAGNOSIS — J690 Pneumonitis due to inhalation of food and vomit: Secondary | ICD-10-CM | POA: Diagnosis not present

## 2023-12-04 DIAGNOSIS — J9621 Acute and chronic respiratory failure with hypoxia: Secondary | ICD-10-CM | POA: Diagnosis not present

## 2023-12-04 DIAGNOSIS — I480 Paroxysmal atrial fibrillation: Secondary | ICD-10-CM | POA: Diagnosis not present

## 2023-12-04 DIAGNOSIS — J90 Pleural effusion, not elsewhere classified: Secondary | ICD-10-CM | POA: Diagnosis not present

## 2023-12-04 DIAGNOSIS — I10 Essential (primary) hypertension: Secondary | ICD-10-CM | POA: Diagnosis not present

## 2023-12-04 DIAGNOSIS — D692 Other nonthrombocytopenic purpura: Secondary | ICD-10-CM | POA: Diagnosis not present

## 2023-12-04 DIAGNOSIS — F0393 Unspecified dementia, unspecified severity, with mood disturbance: Secondary | ICD-10-CM | POA: Diagnosis not present

## 2023-12-04 DIAGNOSIS — I959 Hypotension, unspecified: Secondary | ICD-10-CM | POA: Diagnosis not present

## 2023-12-04 DIAGNOSIS — I5023 Acute on chronic systolic (congestive) heart failure: Secondary | ICD-10-CM | POA: Diagnosis not present

## 2023-12-04 DIAGNOSIS — J9690 Respiratory failure, unspecified, unspecified whether with hypoxia or hypercapnia: Secondary | ICD-10-CM | POA: Diagnosis not present

## 2023-12-04 DIAGNOSIS — J9601 Acute respiratory failure with hypoxia: Secondary | ICD-10-CM | POA: Diagnosis not present

## 2023-12-04 DIAGNOSIS — K222 Esophageal obstruction: Secondary | ICD-10-CM | POA: Diagnosis not present

## 2023-12-04 DIAGNOSIS — K317 Polyp of stomach and duodenum: Secondary | ICD-10-CM | POA: Diagnosis not present

## 2023-12-04 DIAGNOSIS — K219 Gastro-esophageal reflux disease without esophagitis: Secondary | ICD-10-CM | POA: Diagnosis not present

## 2023-12-04 DIAGNOSIS — R918 Other nonspecific abnormal finding of lung field: Secondary | ICD-10-CM | POA: Diagnosis not present

## 2023-12-04 DIAGNOSIS — Z9889 Other specified postprocedural states: Secondary | ICD-10-CM | POA: Diagnosis not present

## 2023-12-04 DIAGNOSIS — D131 Benign neoplasm of stomach: Secondary | ICD-10-CM | POA: Diagnosis not present

## 2023-12-04 DIAGNOSIS — E876 Hypokalemia: Secondary | ICD-10-CM | POA: Diagnosis not present

## 2023-12-04 DIAGNOSIS — I447 Left bundle-branch block, unspecified: Secondary | ICD-10-CM | POA: Diagnosis not present

## 2023-12-04 DIAGNOSIS — R1310 Dysphagia, unspecified: Secondary | ICD-10-CM | POA: Diagnosis not present

## 2023-12-04 DIAGNOSIS — R2681 Unsteadiness on feet: Secondary | ICD-10-CM | POA: Diagnosis not present

## 2023-12-04 DIAGNOSIS — Z66 Do not resuscitate: Secondary | ICD-10-CM | POA: Diagnosis not present

## 2023-12-04 DIAGNOSIS — E46 Unspecified protein-calorie malnutrition: Secondary | ICD-10-CM | POA: Diagnosis not present

## 2023-12-04 DIAGNOSIS — Z8709 Personal history of other diseases of the respiratory system: Secondary | ICD-10-CM | POA: Diagnosis not present

## 2023-12-04 DIAGNOSIS — J9622 Acute and chronic respiratory failure with hypercapnia: Secondary | ICD-10-CM | POA: Diagnosis not present

## 2023-12-04 DIAGNOSIS — F0390 Unspecified dementia without behavioral disturbance: Secondary | ICD-10-CM | POA: Diagnosis not present

## 2023-12-04 DIAGNOSIS — J9612 Chronic respiratory failure with hypercapnia: Secondary | ICD-10-CM | POA: Diagnosis not present

## 2023-12-04 DIAGNOSIS — E860 Dehydration: Secondary | ICD-10-CM | POA: Diagnosis not present

## 2023-12-05 DIAGNOSIS — J90 Pleural effusion, not elsewhere classified: Secondary | ICD-10-CM | POA: Diagnosis not present

## 2023-12-06 DIAGNOSIS — J90 Pleural effusion, not elsewhere classified: Secondary | ICD-10-CM | POA: Diagnosis not present

## 2023-12-06 DIAGNOSIS — N179 Acute kidney failure, unspecified: Secondary | ICD-10-CM | POA: Diagnosis not present

## 2023-12-06 DIAGNOSIS — Z9889 Other specified postprocedural states: Secondary | ICD-10-CM | POA: Diagnosis not present

## 2023-12-08 DIAGNOSIS — J90 Pleural effusion, not elsewhere classified: Secondary | ICD-10-CM | POA: Diagnosis not present

## 2023-12-10 DIAGNOSIS — R062 Wheezing: Secondary | ICD-10-CM | POA: Diagnosis not present

## 2023-12-15 DIAGNOSIS — I454 Nonspecific intraventricular block: Secondary | ICD-10-CM | POA: Diagnosis not present

## 2023-12-15 DIAGNOSIS — I4891 Unspecified atrial fibrillation: Secondary | ICD-10-CM | POA: Diagnosis not present

## 2023-12-21 DIAGNOSIS — R41841 Cognitive communication deficit: Secondary | ICD-10-CM | POA: Diagnosis not present

## 2023-12-21 DIAGNOSIS — J449 Chronic obstructive pulmonary disease, unspecified: Secondary | ICD-10-CM | POA: Diagnosis not present

## 2023-12-21 DIAGNOSIS — I480 Paroxysmal atrial fibrillation: Secondary | ICD-10-CM | POA: Diagnosis not present

## 2023-12-21 DIAGNOSIS — J90 Pleural effusion, not elsewhere classified: Secondary | ICD-10-CM | POA: Diagnosis not present

## 2023-12-21 DIAGNOSIS — R059 Cough, unspecified: Secondary | ICD-10-CM | POA: Diagnosis not present

## 2023-12-21 DIAGNOSIS — E46 Unspecified protein-calorie malnutrition: Secondary | ICD-10-CM | POA: Diagnosis not present

## 2023-12-21 DIAGNOSIS — J9612 Chronic respiratory failure with hypercapnia: Secondary | ICD-10-CM | POA: Diagnosis not present

## 2023-12-21 DIAGNOSIS — R262 Difficulty in walking, not elsewhere classified: Secondary | ICD-10-CM | POA: Diagnosis not present

## 2023-12-21 DIAGNOSIS — R1312 Dysphagia, oropharyngeal phase: Secondary | ICD-10-CM | POA: Diagnosis not present

## 2023-12-21 DIAGNOSIS — R918 Other nonspecific abnormal finding of lung field: Secondary | ICD-10-CM | POA: Diagnosis not present

## 2023-12-21 DIAGNOSIS — I10 Essential (primary) hypertension: Secondary | ICD-10-CM | POA: Diagnosis not present

## 2023-12-21 DIAGNOSIS — M6259 Muscle wasting and atrophy, not elsewhere classified, multiple sites: Secondary | ICD-10-CM | POA: Diagnosis not present

## 2023-12-21 DIAGNOSIS — D692 Other nonthrombocytopenic purpura: Secondary | ICD-10-CM | POA: Diagnosis not present

## 2023-12-21 DIAGNOSIS — I5023 Acute on chronic systolic (congestive) heart failure: Secondary | ICD-10-CM | POA: Diagnosis not present

## 2023-12-21 DIAGNOSIS — I5040 Unspecified combined systolic (congestive) and diastolic (congestive) heart failure: Secondary | ICD-10-CM | POA: Diagnosis not present

## 2023-12-21 DIAGNOSIS — R2681 Unsteadiness on feet: Secondary | ICD-10-CM | POA: Diagnosis not present

## 2023-12-21 DIAGNOSIS — I5022 Chronic systolic (congestive) heart failure: Secondary | ICD-10-CM | POA: Diagnosis not present

## 2023-12-21 DIAGNOSIS — K222 Esophageal obstruction: Secondary | ICD-10-CM | POA: Diagnosis not present

## 2023-12-21 DIAGNOSIS — R6889 Other general symptoms and signs: Secondary | ICD-10-CM | POA: Diagnosis not present

## 2023-12-21 DIAGNOSIS — Z8709 Personal history of other diseases of the respiratory system: Secondary | ICD-10-CM | POA: Diagnosis not present

## 2023-12-21 DIAGNOSIS — F0390 Unspecified dementia without behavioral disturbance: Secondary | ICD-10-CM | POA: Diagnosis not present

## 2023-12-24 DIAGNOSIS — I5022 Chronic systolic (congestive) heart failure: Secondary | ICD-10-CM | POA: Diagnosis not present

## 2023-12-24 DIAGNOSIS — M6259 Muscle wasting and atrophy, not elsewhere classified, multiple sites: Secondary | ICD-10-CM | POA: Diagnosis not present

## 2023-12-24 DIAGNOSIS — I480 Paroxysmal atrial fibrillation: Secondary | ICD-10-CM | POA: Diagnosis not present

## 2023-12-24 DIAGNOSIS — F0390 Unspecified dementia without behavioral disturbance: Secondary | ICD-10-CM | POA: Diagnosis not present

## 2023-12-26 ENCOUNTER — Encounter: Payer: Self-pay | Admitting: Internal Medicine

## 2023-12-26 DIAGNOSIS — I10 Essential (primary) hypertension: Secondary | ICD-10-CM | POA: Diagnosis not present

## 2023-12-26 DIAGNOSIS — F0390 Unspecified dementia without behavioral disturbance: Secondary | ICD-10-CM | POA: Diagnosis not present

## 2023-12-26 DIAGNOSIS — I5022 Chronic systolic (congestive) heart failure: Secondary | ICD-10-CM | POA: Diagnosis not present

## 2023-12-26 DIAGNOSIS — M6259 Muscle wasting and atrophy, not elsewhere classified, multiple sites: Secondary | ICD-10-CM | POA: Diagnosis not present

## 2023-12-27 DIAGNOSIS — R6889 Other general symptoms and signs: Secondary | ICD-10-CM | POA: Diagnosis not present

## 2024-01-01 DIAGNOSIS — R918 Other nonspecific abnormal finding of lung field: Secondary | ICD-10-CM | POA: Diagnosis not present

## 2024-01-01 DIAGNOSIS — J449 Chronic obstructive pulmonary disease, unspecified: Secondary | ICD-10-CM | POA: Diagnosis not present

## 2024-01-04 DIAGNOSIS — Z8709 Personal history of other diseases of the respiratory system: Secondary | ICD-10-CM | POA: Diagnosis not present

## 2024-01-04 DIAGNOSIS — F0390 Unspecified dementia without behavioral disturbance: Secondary | ICD-10-CM | POA: Diagnosis not present

## 2024-01-04 DIAGNOSIS — I5022 Chronic systolic (congestive) heart failure: Secondary | ICD-10-CM | POA: Diagnosis not present

## 2024-01-04 DIAGNOSIS — M6259 Muscle wasting and atrophy, not elsewhere classified, multiple sites: Secondary | ICD-10-CM | POA: Diagnosis not present

## 2024-01-07 DIAGNOSIS — F0393 Unspecified dementia, unspecified severity, with mood disturbance: Secondary | ICD-10-CM | POA: Diagnosis not present

## 2024-01-07 DIAGNOSIS — I69398 Other sequelae of cerebral infarction: Secondary | ICD-10-CM | POA: Diagnosis not present

## 2024-01-07 DIAGNOSIS — Z8501 Personal history of malignant neoplasm of esophagus: Secondary | ICD-10-CM | POA: Diagnosis not present

## 2024-01-07 DIAGNOSIS — N401 Enlarged prostate with lower urinary tract symptoms: Secondary | ICD-10-CM | POA: Diagnosis not present

## 2024-01-07 DIAGNOSIS — I5023 Acute on chronic systolic (congestive) heart failure: Secondary | ICD-10-CM | POA: Diagnosis not present

## 2024-01-07 DIAGNOSIS — K222 Esophageal obstruction: Secondary | ICD-10-CM | POA: Diagnosis not present

## 2024-01-07 DIAGNOSIS — I350 Nonrheumatic aortic (valve) stenosis: Secondary | ICD-10-CM | POA: Diagnosis not present

## 2024-01-07 DIAGNOSIS — M21372 Foot drop, left foot: Secondary | ICD-10-CM | POA: Diagnosis not present

## 2024-01-07 DIAGNOSIS — J449 Chronic obstructive pulmonary disease, unspecified: Secondary | ICD-10-CM | POA: Diagnosis not present

## 2024-01-07 DIAGNOSIS — J9612 Chronic respiratory failure with hypercapnia: Secondary | ICD-10-CM | POA: Diagnosis not present

## 2024-01-07 DIAGNOSIS — E785 Hyperlipidemia, unspecified: Secondary | ICD-10-CM | POA: Diagnosis not present

## 2024-01-07 DIAGNOSIS — F320 Major depressive disorder, single episode, mild: Secondary | ICD-10-CM | POA: Diagnosis not present

## 2024-01-07 DIAGNOSIS — I480 Paroxysmal atrial fibrillation: Secondary | ICD-10-CM | POA: Diagnosis not present

## 2024-01-07 DIAGNOSIS — M6259 Muscle wasting and atrophy, not elsewhere classified, multiple sites: Secondary | ICD-10-CM | POA: Diagnosis not present

## 2024-01-07 DIAGNOSIS — Z556 Problems related to health literacy: Secondary | ICD-10-CM | POA: Diagnosis not present

## 2024-01-07 DIAGNOSIS — R1310 Dysphagia, unspecified: Secondary | ICD-10-CM | POA: Diagnosis not present

## 2024-01-07 DIAGNOSIS — L89152 Pressure ulcer of sacral region, stage 2: Secondary | ICD-10-CM | POA: Diagnosis not present

## 2024-01-07 DIAGNOSIS — K219 Gastro-esophageal reflux disease without esophagitis: Secondary | ICD-10-CM | POA: Diagnosis not present

## 2024-01-07 DIAGNOSIS — D539 Nutritional anemia, unspecified: Secondary | ICD-10-CM | POA: Diagnosis not present

## 2024-01-07 DIAGNOSIS — Z7982 Long term (current) use of aspirin: Secondary | ICD-10-CM | POA: Diagnosis not present

## 2024-01-07 DIAGNOSIS — R350 Frequency of micturition: Secondary | ICD-10-CM | POA: Diagnosis not present

## 2024-01-07 DIAGNOSIS — E46 Unspecified protein-calorie malnutrition: Secondary | ICD-10-CM | POA: Diagnosis not present

## 2024-01-07 DIAGNOSIS — Z87891 Personal history of nicotine dependence: Secondary | ICD-10-CM | POA: Diagnosis not present

## 2024-01-07 DIAGNOSIS — I110 Hypertensive heart disease with heart failure: Secondary | ICD-10-CM | POA: Diagnosis not present

## 2024-01-08 DIAGNOSIS — J90 Pleural effusion, not elsewhere classified: Secondary | ICD-10-CM | POA: Diagnosis not present

## 2024-01-08 DIAGNOSIS — Z7901 Long term (current) use of anticoagulants: Secondary | ICD-10-CM | POA: Diagnosis not present

## 2024-01-08 DIAGNOSIS — I4819 Other persistent atrial fibrillation: Secondary | ICD-10-CM | POA: Diagnosis not present

## 2024-01-08 DIAGNOSIS — I10 Essential (primary) hypertension: Secondary | ICD-10-CM | POA: Diagnosis not present

## 2024-01-08 DIAGNOSIS — Z952 Presence of prosthetic heart valve: Secondary | ICD-10-CM | POA: Diagnosis not present

## 2024-01-09 ENCOUNTER — Other Ambulatory Visit (HOSPITAL_COMMUNITY): Payer: Self-pay

## 2024-01-10 ENCOUNTER — Other Ambulatory Visit: Payer: Self-pay

## 2024-01-10 ENCOUNTER — Other Ambulatory Visit (HOSPITAL_COMMUNITY): Payer: Self-pay

## 2024-01-17 ENCOUNTER — Other Ambulatory Visit (HOSPITAL_COMMUNITY): Payer: Self-pay

## 2024-01-18 ENCOUNTER — Other Ambulatory Visit (HOSPITAL_COMMUNITY): Payer: Self-pay

## 2024-01-18 MED ORDER — ELIQUIS 2.5 MG PO TABS
2.5000 mg | ORAL_TABLET | Freq: Two times a day (BID) | ORAL | 3 refills | Status: AC
  Filled 2024-01-18: qty 180, 90d supply, fill #0

## 2024-01-24 ENCOUNTER — Other Ambulatory Visit (HOSPITAL_COMMUNITY): Payer: Self-pay

## 2024-01-24 DIAGNOSIS — R634 Abnormal weight loss: Secondary | ICD-10-CM | POA: Diagnosis not present

## 2024-01-24 DIAGNOSIS — I5023 Acute on chronic systolic (congestive) heart failure: Secondary | ICD-10-CM | POA: Diagnosis not present

## 2024-01-24 DIAGNOSIS — R627 Adult failure to thrive: Secondary | ICD-10-CM | POA: Diagnosis not present

## 2024-01-24 DIAGNOSIS — E440 Moderate protein-calorie malnutrition: Secondary | ICD-10-CM | POA: Diagnosis not present

## 2024-01-24 DIAGNOSIS — R1310 Dysphagia, unspecified: Secondary | ICD-10-CM | POA: Diagnosis not present

## 2024-01-24 DIAGNOSIS — J9621 Acute and chronic respiratory failure with hypoxia: Secondary | ICD-10-CM | POA: Diagnosis not present

## 2024-01-24 DIAGNOSIS — F320 Major depressive disorder, single episode, mild: Secondary | ICD-10-CM | POA: Diagnosis not present

## 2024-01-24 DIAGNOSIS — Z681 Body mass index (BMI) 19 or less, adult: Secondary | ICD-10-CM | POA: Diagnosis not present

## 2024-01-24 DIAGNOSIS — I482 Chronic atrial fibrillation, unspecified: Secondary | ICD-10-CM | POA: Diagnosis not present

## 2024-01-24 DIAGNOSIS — I10 Essential (primary) hypertension: Secondary | ICD-10-CM | POA: Diagnosis not present

## 2024-01-24 DIAGNOSIS — F09 Unspecified mental disorder due to known physiological condition: Secondary | ICD-10-CM | POA: Diagnosis not present

## 2024-01-24 DIAGNOSIS — Z79899 Other long term (current) drug therapy: Secondary | ICD-10-CM | POA: Diagnosis not present

## 2024-01-30 ENCOUNTER — Other Ambulatory Visit: Payer: Self-pay

## 2024-01-30 ENCOUNTER — Other Ambulatory Visit (HOSPITAL_COMMUNITY): Payer: Self-pay

## 2024-01-30 MED ORDER — POTASSIUM CHLORIDE CRYS ER 20 MEQ PO TBCR
20.0000 meq | EXTENDED_RELEASE_TABLET | Freq: Every day | ORAL | 0 refills | Status: AC
  Filled 2024-01-30 – 2024-01-31 (×2): qty 90, 90d supply, fill #0

## 2024-01-30 MED ORDER — AMIODARONE HCL 200 MG PO TABS
200.0000 mg | ORAL_TABLET | Freq: Two times a day (BID) | ORAL | 0 refills | Status: AC
  Filled 2024-01-30: qty 60, 30d supply, fill #0

## 2024-01-30 MED ORDER — FUROSEMIDE 20 MG PO TABS
20.0000 mg | ORAL_TABLET | Freq: Two times a day (BID) | ORAL | 0 refills | Status: AC
  Filled 2024-01-30: qty 180, 90d supply, fill #0

## 2024-01-30 MED ORDER — LOSARTAN POTASSIUM 25 MG PO TABS
25.0000 mg | ORAL_TABLET | Freq: Every day | ORAL | 0 refills | Status: AC
  Filled 2024-01-30: qty 90, 90d supply, fill #0

## 2024-01-31 ENCOUNTER — Other Ambulatory Visit (HOSPITAL_COMMUNITY): Payer: Self-pay

## 2024-01-31 ENCOUNTER — Other Ambulatory Visit: Payer: Self-pay

## 2024-01-31 MED ORDER — SUCRALFATE 1 GM/10ML PO SUSP
1.0000 g | Freq: Three times a day (TID) | ORAL | 1 refills | Status: AC
  Filled 2024-01-31: qty 3600, 90d supply, fill #0

## 2024-02-03 DIAGNOSIS — J449 Chronic obstructive pulmonary disease, unspecified: Secondary | ICD-10-CM | POA: Diagnosis not present

## 2024-02-04 DIAGNOSIS — G4733 Obstructive sleep apnea (adult) (pediatric): Secondary | ICD-10-CM | POA: Diagnosis not present

## 2024-03-08 DEATH — deceased
